# Patient Record
Sex: Female | Born: 1940 | Race: White | Hispanic: No | Marital: Married | Smoking: Former smoker
Health system: Southern US, Community
[De-identification: ages and names within clinical notes are randomized; demographics above are authoritative.]

## PROBLEM LIST (undated history)

## (undated) DIAGNOSIS — I639 Cerebral infarction, unspecified: Secondary | ICD-10-CM

## (undated) DIAGNOSIS — F32A Depression, unspecified: Secondary | ICD-10-CM

## (undated) DIAGNOSIS — K219 Gastro-esophageal reflux disease without esophagitis: Secondary | ICD-10-CM

## (undated) DIAGNOSIS — F419 Anxiety disorder, unspecified: Secondary | ICD-10-CM

## (undated) DIAGNOSIS — R569 Unspecified convulsions: Secondary | ICD-10-CM

## (undated) DIAGNOSIS — H269 Unspecified cataract: Secondary | ICD-10-CM

## (undated) DIAGNOSIS — E119 Type 2 diabetes mellitus without complications: Secondary | ICD-10-CM

## (undated) DIAGNOSIS — D75839 Thrombocytosis, unspecified: Secondary | ICD-10-CM

## (undated) DIAGNOSIS — E785 Hyperlipidemia, unspecified: Secondary | ICD-10-CM

## (undated) DIAGNOSIS — E079 Disorder of thyroid, unspecified: Secondary | ICD-10-CM

## (undated) DIAGNOSIS — C801 Malignant (primary) neoplasm, unspecified: Secondary | ICD-10-CM

## (undated) DIAGNOSIS — J449 Chronic obstructive pulmonary disease, unspecified: Secondary | ICD-10-CM

## (undated) DIAGNOSIS — I10 Essential (primary) hypertension: Secondary | ICD-10-CM

## (undated) DIAGNOSIS — E875 Hyperkalemia: Secondary | ICD-10-CM

## (undated) DIAGNOSIS — M1990 Unspecified osteoarthritis, unspecified site: Secondary | ICD-10-CM

## (undated) DIAGNOSIS — F329 Major depressive disorder, single episode, unspecified: Secondary | ICD-10-CM

## (undated) DIAGNOSIS — D473 Essential (hemorrhagic) thrombocythemia: Secondary | ICD-10-CM

## (undated) HISTORY — PX: TONSILLECTOMY: SHX5217

## (undated) HISTORY — DX: Essential (primary) hypertension: I10

## (undated) HISTORY — PX: BREAST SURGERY: SHX581

## (undated) HISTORY — DX: Hyperlipidemia, unspecified: E78.5

## (undated) HISTORY — DX: Chronic obstructive pulmonary disease, unspecified: J44.9

## (undated) HISTORY — DX: Unspecified cataract: H26.9

## (undated) HISTORY — PX: ABDOMINAL HYSTERECTOMY: SHX81

## (undated) HISTORY — DX: Anxiety disorder, unspecified: F41.9

## (undated) HISTORY — PX: OTHER SURGICAL HISTORY: SHX169

## (undated) HISTORY — PX: TONSILLECTOMY: SUR1361

## (undated) HISTORY — DX: Major depressive disorder, single episode, unspecified: F32.9

## (undated) HISTORY — DX: Disorder of thyroid, unspecified: E07.9

## (undated) HISTORY — PX: ANKLE SURGERY: SHX546

## (undated) HISTORY — PX: ROTATOR CUFF REPAIR: SHX139

## (undated) HISTORY — DX: Type 2 diabetes mellitus without complications: E11.9

## (undated) HISTORY — DX: Depression, unspecified: F32.A

---

## 2011-03-11 HISTORY — PX: COLONOSCOPY: SHX174

## 2011-03-17 DIAGNOSIS — J111 Influenza due to unidentified influenza virus with other respiratory manifestations: Secondary | ICD-10-CM | POA: Diagnosis not present

## 2011-03-17 DIAGNOSIS — J449 Chronic obstructive pulmonary disease, unspecified: Secondary | ICD-10-CM | POA: Diagnosis not present

## 2011-03-17 DIAGNOSIS — J189 Pneumonia, unspecified organism: Secondary | ICD-10-CM | POA: Diagnosis not present

## 2011-03-18 DIAGNOSIS — R0609 Other forms of dyspnea: Secondary | ICD-10-CM | POA: Diagnosis not present

## 2011-03-18 DIAGNOSIS — R0989 Other specified symptoms and signs involving the circulatory and respiratory systems: Secondary | ICD-10-CM | POA: Diagnosis not present

## 2011-03-18 DIAGNOSIS — R05 Cough: Secondary | ICD-10-CM | POA: Diagnosis not present

## 2011-03-20 DIAGNOSIS — J441 Chronic obstructive pulmonary disease with (acute) exacerbation: Secondary | ICD-10-CM | POA: Diagnosis not present

## 2011-03-20 DIAGNOSIS — I10 Essential (primary) hypertension: Secondary | ICD-10-CM | POA: Diagnosis not present

## 2011-05-26 DIAGNOSIS — E119 Type 2 diabetes mellitus without complications: Secondary | ICD-10-CM | POA: Diagnosis not present

## 2011-05-26 DIAGNOSIS — E039 Hypothyroidism, unspecified: Secondary | ICD-10-CM | POA: Diagnosis not present

## 2011-05-26 DIAGNOSIS — I10 Essential (primary) hypertension: Secondary | ICD-10-CM | POA: Diagnosis not present

## 2011-05-26 DIAGNOSIS — F411 Generalized anxiety disorder: Secondary | ICD-10-CM | POA: Diagnosis not present

## 2011-11-18 DIAGNOSIS — E039 Hypothyroidism, unspecified: Secondary | ICD-10-CM | POA: Diagnosis not present

## 2011-11-18 DIAGNOSIS — F411 Generalized anxiety disorder: Secondary | ICD-10-CM | POA: Diagnosis not present

## 2011-11-18 DIAGNOSIS — J449 Chronic obstructive pulmonary disease, unspecified: Secondary | ICD-10-CM | POA: Diagnosis not present

## 2011-11-18 DIAGNOSIS — E119 Type 2 diabetes mellitus without complications: Secondary | ICD-10-CM | POA: Diagnosis not present

## 2011-12-16 DIAGNOSIS — E039 Hypothyroidism, unspecified: Secondary | ICD-10-CM | POA: Diagnosis not present

## 2011-12-16 DIAGNOSIS — J441 Chronic obstructive pulmonary disease with (acute) exacerbation: Secondary | ICD-10-CM | POA: Diagnosis not present

## 2011-12-16 DIAGNOSIS — E119 Type 2 diabetes mellitus without complications: Secondary | ICD-10-CM | POA: Diagnosis not present

## 2011-12-30 DIAGNOSIS — J449 Chronic obstructive pulmonary disease, unspecified: Secondary | ICD-10-CM | POA: Diagnosis not present

## 2011-12-30 DIAGNOSIS — D473 Essential (hemorrhagic) thrombocythemia: Secondary | ICD-10-CM | POA: Diagnosis present

## 2011-12-30 DIAGNOSIS — R220 Localized swelling, mass and lump, head: Secondary | ICD-10-CM | POA: Diagnosis not present

## 2011-12-30 DIAGNOSIS — E039 Hypothyroidism, unspecified: Secondary | ICD-10-CM | POA: Diagnosis not present

## 2011-12-30 DIAGNOSIS — Z87891 Personal history of nicotine dependence: Secondary | ICD-10-CM | POA: Diagnosis not present

## 2011-12-30 DIAGNOSIS — R9431 Abnormal electrocardiogram [ECG] [EKG]: Secondary | ICD-10-CM | POA: Diagnosis present

## 2011-12-30 DIAGNOSIS — E119 Type 2 diabetes mellitus without complications: Secondary | ICD-10-CM | POA: Diagnosis present

## 2011-12-30 DIAGNOSIS — R0602 Shortness of breath: Secondary | ICD-10-CM | POA: Diagnosis not present

## 2011-12-30 DIAGNOSIS — D45 Polycythemia vera: Secondary | ICD-10-CM | POA: Diagnosis not present

## 2011-12-30 DIAGNOSIS — R0902 Hypoxemia: Secondary | ICD-10-CM | POA: Diagnosis not present

## 2011-12-30 DIAGNOSIS — J441 Chronic obstructive pulmonary disease with (acute) exacerbation: Secondary | ICD-10-CM | POA: Diagnosis not present

## 2011-12-30 DIAGNOSIS — I10 Essential (primary) hypertension: Secondary | ICD-10-CM | POA: Diagnosis present

## 2011-12-30 DIAGNOSIS — E860 Dehydration: Secondary | ICD-10-CM | POA: Diagnosis present

## 2011-12-30 DIAGNOSIS — E785 Hyperlipidemia, unspecified: Secondary | ICD-10-CM | POA: Diagnosis present

## 2011-12-30 DIAGNOSIS — F329 Major depressive disorder, single episode, unspecified: Secondary | ICD-10-CM | POA: Diagnosis present

## 2011-12-30 DIAGNOSIS — F3289 Other specified depressive episodes: Secondary | ICD-10-CM | POA: Diagnosis not present

## 2011-12-30 DIAGNOSIS — R221 Localized swelling, mass and lump, neck: Secondary | ICD-10-CM | POA: Diagnosis present

## 2012-01-05 DIAGNOSIS — R5381 Other malaise: Secondary | ICD-10-CM | POA: Diagnosis not present

## 2012-01-05 DIAGNOSIS — J441 Chronic obstructive pulmonary disease with (acute) exacerbation: Secondary | ICD-10-CM | POA: Diagnosis not present

## 2012-01-05 DIAGNOSIS — R5383 Other fatigue: Secondary | ICD-10-CM | POA: Diagnosis not present

## 2012-01-05 DIAGNOSIS — E119 Type 2 diabetes mellitus without complications: Secondary | ICD-10-CM | POA: Diagnosis not present

## 2012-01-05 DIAGNOSIS — Z9981 Dependence on supplemental oxygen: Secondary | ICD-10-CM | POA: Diagnosis not present

## 2012-01-05 DIAGNOSIS — I10 Essential (primary) hypertension: Secondary | ICD-10-CM | POA: Diagnosis not present

## 2012-01-05 DIAGNOSIS — F329 Major depressive disorder, single episode, unspecified: Secondary | ICD-10-CM | POA: Diagnosis not present

## 2012-01-06 DIAGNOSIS — R5383 Other fatigue: Secondary | ICD-10-CM | POA: Diagnosis not present

## 2012-01-06 DIAGNOSIS — Z9981 Dependence on supplemental oxygen: Secondary | ICD-10-CM | POA: Diagnosis not present

## 2012-01-06 DIAGNOSIS — R5381 Other malaise: Secondary | ICD-10-CM | POA: Diagnosis not present

## 2012-01-06 DIAGNOSIS — E119 Type 2 diabetes mellitus without complications: Secondary | ICD-10-CM | POA: Diagnosis not present

## 2012-01-06 DIAGNOSIS — F329 Major depressive disorder, single episode, unspecified: Secondary | ICD-10-CM | POA: Diagnosis not present

## 2012-01-06 DIAGNOSIS — I10 Essential (primary) hypertension: Secondary | ICD-10-CM | POA: Diagnosis not present

## 2012-01-06 DIAGNOSIS — J441 Chronic obstructive pulmonary disease with (acute) exacerbation: Secondary | ICD-10-CM | POA: Diagnosis not present

## 2012-01-08 DIAGNOSIS — J441 Chronic obstructive pulmonary disease with (acute) exacerbation: Secondary | ICD-10-CM | POA: Diagnosis not present

## 2012-01-08 DIAGNOSIS — R5381 Other malaise: Secondary | ICD-10-CM | POA: Diagnosis not present

## 2012-01-08 DIAGNOSIS — E119 Type 2 diabetes mellitus without complications: Secondary | ICD-10-CM | POA: Diagnosis not present

## 2012-01-08 DIAGNOSIS — Z9981 Dependence on supplemental oxygen: Secondary | ICD-10-CM | POA: Diagnosis not present

## 2012-01-08 DIAGNOSIS — F329 Major depressive disorder, single episode, unspecified: Secondary | ICD-10-CM | POA: Diagnosis not present

## 2012-01-08 DIAGNOSIS — I10 Essential (primary) hypertension: Secondary | ICD-10-CM | POA: Diagnosis not present

## 2012-01-12 DIAGNOSIS — Z9981 Dependence on supplemental oxygen: Secondary | ICD-10-CM | POA: Diagnosis not present

## 2012-01-12 DIAGNOSIS — E119 Type 2 diabetes mellitus without complications: Secondary | ICD-10-CM | POA: Diagnosis not present

## 2012-01-12 DIAGNOSIS — F329 Major depressive disorder, single episode, unspecified: Secondary | ICD-10-CM | POA: Diagnosis not present

## 2012-01-12 DIAGNOSIS — J441 Chronic obstructive pulmonary disease with (acute) exacerbation: Secondary | ICD-10-CM | POA: Diagnosis not present

## 2012-01-12 DIAGNOSIS — I10 Essential (primary) hypertension: Secondary | ICD-10-CM | POA: Diagnosis not present

## 2012-01-12 DIAGNOSIS — R5381 Other malaise: Secondary | ICD-10-CM | POA: Diagnosis not present

## 2012-01-13 DIAGNOSIS — I10 Essential (primary) hypertension: Secondary | ICD-10-CM | POA: Diagnosis not present

## 2012-01-13 DIAGNOSIS — M4180 Other forms of scoliosis, site unspecified: Secondary | ICD-10-CM | POA: Diagnosis not present

## 2012-01-13 DIAGNOSIS — J449 Chronic obstructive pulmonary disease, unspecified: Secondary | ICD-10-CM | POA: Diagnosis not present

## 2012-01-14 DIAGNOSIS — F329 Major depressive disorder, single episode, unspecified: Secondary | ICD-10-CM | POA: Diagnosis not present

## 2012-01-14 DIAGNOSIS — I10 Essential (primary) hypertension: Secondary | ICD-10-CM | POA: Diagnosis not present

## 2012-01-14 DIAGNOSIS — J441 Chronic obstructive pulmonary disease with (acute) exacerbation: Secondary | ICD-10-CM | POA: Diagnosis not present

## 2012-01-14 DIAGNOSIS — Z9981 Dependence on supplemental oxygen: Secondary | ICD-10-CM | POA: Diagnosis not present

## 2012-01-14 DIAGNOSIS — R5383 Other fatigue: Secondary | ICD-10-CM | POA: Diagnosis not present

## 2012-01-14 DIAGNOSIS — R5381 Other malaise: Secondary | ICD-10-CM | POA: Diagnosis not present

## 2012-01-14 DIAGNOSIS — E119 Type 2 diabetes mellitus without complications: Secondary | ICD-10-CM | POA: Diagnosis not present

## 2012-01-20 DIAGNOSIS — E119 Type 2 diabetes mellitus without complications: Secondary | ICD-10-CM | POA: Diagnosis not present

## 2012-01-20 DIAGNOSIS — J441 Chronic obstructive pulmonary disease with (acute) exacerbation: Secondary | ICD-10-CM | POA: Diagnosis not present

## 2012-01-20 DIAGNOSIS — Z9981 Dependence on supplemental oxygen: Secondary | ICD-10-CM | POA: Diagnosis not present

## 2012-01-20 DIAGNOSIS — R5383 Other fatigue: Secondary | ICD-10-CM | POA: Diagnosis not present

## 2012-01-20 DIAGNOSIS — R5381 Other malaise: Secondary | ICD-10-CM | POA: Diagnosis not present

## 2012-01-20 DIAGNOSIS — I10 Essential (primary) hypertension: Secondary | ICD-10-CM | POA: Diagnosis not present

## 2012-01-20 DIAGNOSIS — F329 Major depressive disorder, single episode, unspecified: Secondary | ICD-10-CM | POA: Diagnosis not present

## 2012-01-22 DIAGNOSIS — E119 Type 2 diabetes mellitus without complications: Secondary | ICD-10-CM | POA: Diagnosis not present

## 2012-01-22 DIAGNOSIS — J441 Chronic obstructive pulmonary disease with (acute) exacerbation: Secondary | ICD-10-CM | POA: Diagnosis not present

## 2012-01-22 DIAGNOSIS — F329 Major depressive disorder, single episode, unspecified: Secondary | ICD-10-CM | POA: Diagnosis not present

## 2012-01-22 DIAGNOSIS — Z9981 Dependence on supplemental oxygen: Secondary | ICD-10-CM | POA: Diagnosis not present

## 2012-01-22 DIAGNOSIS — I10 Essential (primary) hypertension: Secondary | ICD-10-CM | POA: Diagnosis not present

## 2012-01-22 DIAGNOSIS — R5381 Other malaise: Secondary | ICD-10-CM | POA: Diagnosis not present

## 2012-01-23 DIAGNOSIS — E119 Type 2 diabetes mellitus without complications: Secondary | ICD-10-CM | POA: Diagnosis not present

## 2012-01-23 DIAGNOSIS — J441 Chronic obstructive pulmonary disease with (acute) exacerbation: Secondary | ICD-10-CM | POA: Diagnosis not present

## 2012-01-23 DIAGNOSIS — F329 Major depressive disorder, single episode, unspecified: Secondary | ICD-10-CM | POA: Diagnosis not present

## 2012-01-23 DIAGNOSIS — Z9981 Dependence on supplemental oxygen: Secondary | ICD-10-CM | POA: Diagnosis not present

## 2012-01-23 DIAGNOSIS — R5381 Other malaise: Secondary | ICD-10-CM | POA: Diagnosis not present

## 2012-01-23 DIAGNOSIS — R5383 Other fatigue: Secondary | ICD-10-CM | POA: Diagnosis not present

## 2012-01-23 DIAGNOSIS — I10 Essential (primary) hypertension: Secondary | ICD-10-CM | POA: Diagnosis not present

## 2012-01-26 DIAGNOSIS — J441 Chronic obstructive pulmonary disease with (acute) exacerbation: Secondary | ICD-10-CM | POA: Diagnosis not present

## 2012-01-26 DIAGNOSIS — R5381 Other malaise: Secondary | ICD-10-CM | POA: Diagnosis not present

## 2012-01-26 DIAGNOSIS — R5383 Other fatigue: Secondary | ICD-10-CM | POA: Diagnosis not present

## 2012-01-26 DIAGNOSIS — Z9981 Dependence on supplemental oxygen: Secondary | ICD-10-CM | POA: Diagnosis not present

## 2012-01-26 DIAGNOSIS — I10 Essential (primary) hypertension: Secondary | ICD-10-CM | POA: Diagnosis not present

## 2012-01-26 DIAGNOSIS — F329 Major depressive disorder, single episode, unspecified: Secondary | ICD-10-CM | POA: Diagnosis not present

## 2012-01-26 DIAGNOSIS — E119 Type 2 diabetes mellitus without complications: Secondary | ICD-10-CM | POA: Diagnosis not present

## 2012-01-28 DIAGNOSIS — R5383 Other fatigue: Secondary | ICD-10-CM | POA: Diagnosis not present

## 2012-01-28 DIAGNOSIS — J441 Chronic obstructive pulmonary disease with (acute) exacerbation: Secondary | ICD-10-CM | POA: Diagnosis not present

## 2012-01-28 DIAGNOSIS — I10 Essential (primary) hypertension: Secondary | ICD-10-CM | POA: Diagnosis not present

## 2012-01-28 DIAGNOSIS — Z9981 Dependence on supplemental oxygen: Secondary | ICD-10-CM | POA: Diagnosis not present

## 2012-01-28 DIAGNOSIS — R5381 Other malaise: Secondary | ICD-10-CM | POA: Diagnosis not present

## 2012-01-28 DIAGNOSIS — E119 Type 2 diabetes mellitus without complications: Secondary | ICD-10-CM | POA: Diagnosis not present

## 2012-01-28 DIAGNOSIS — F329 Major depressive disorder, single episode, unspecified: Secondary | ICD-10-CM | POA: Diagnosis not present

## 2012-02-16 DIAGNOSIS — E039 Hypothyroidism, unspecified: Secondary | ICD-10-CM | POA: Diagnosis not present

## 2012-02-16 DIAGNOSIS — J449 Chronic obstructive pulmonary disease, unspecified: Secondary | ICD-10-CM | POA: Diagnosis not present

## 2012-02-16 DIAGNOSIS — F411 Generalized anxiety disorder: Secondary | ICD-10-CM | POA: Diagnosis not present

## 2012-02-16 DIAGNOSIS — E119 Type 2 diabetes mellitus without complications: Secondary | ICD-10-CM | POA: Diagnosis not present

## 2012-02-18 DIAGNOSIS — I10 Essential (primary) hypertension: Secondary | ICD-10-CM | POA: Diagnosis not present

## 2012-02-18 DIAGNOSIS — E039 Hypothyroidism, unspecified: Secondary | ICD-10-CM | POA: Diagnosis not present

## 2012-02-18 DIAGNOSIS — J449 Chronic obstructive pulmonary disease, unspecified: Secondary | ICD-10-CM | POA: Diagnosis not present

## 2012-02-18 DIAGNOSIS — E119 Type 2 diabetes mellitus without complications: Secondary | ICD-10-CM | POA: Diagnosis not present

## 2012-05-27 DIAGNOSIS — E7800 Pure hypercholesterolemia, unspecified: Secondary | ICD-10-CM | POA: Diagnosis not present

## 2012-05-27 DIAGNOSIS — E039 Hypothyroidism, unspecified: Secondary | ICD-10-CM | POA: Diagnosis not present

## 2012-05-27 DIAGNOSIS — I10 Essential (primary) hypertension: Secondary | ICD-10-CM | POA: Diagnosis not present

## 2012-05-27 DIAGNOSIS — J449 Chronic obstructive pulmonary disease, unspecified: Secondary | ICD-10-CM | POA: Diagnosis not present

## 2012-05-27 DIAGNOSIS — E119 Type 2 diabetes mellitus without complications: Secondary | ICD-10-CM | POA: Diagnosis not present

## 2012-06-16 DIAGNOSIS — F329 Major depressive disorder, single episode, unspecified: Secondary | ICD-10-CM | POA: Diagnosis not present

## 2012-06-16 DIAGNOSIS — E039 Hypothyroidism, unspecified: Secondary | ICD-10-CM | POA: Diagnosis not present

## 2012-06-16 DIAGNOSIS — E119 Type 2 diabetes mellitus without complications: Secondary | ICD-10-CM | POA: Diagnosis not present

## 2012-06-16 DIAGNOSIS — E7800 Pure hypercholesterolemia, unspecified: Secondary | ICD-10-CM | POA: Diagnosis not present

## 2012-06-23 DIAGNOSIS — J45901 Unspecified asthma with (acute) exacerbation: Secondary | ICD-10-CM | POA: Diagnosis not present

## 2012-06-23 DIAGNOSIS — J449 Chronic obstructive pulmonary disease, unspecified: Secondary | ICD-10-CM | POA: Diagnosis not present

## 2012-06-23 DIAGNOSIS — R0602 Shortness of breath: Secondary | ICD-10-CM | POA: Diagnosis not present

## 2012-06-23 DIAGNOSIS — R0902 Hypoxemia: Secondary | ICD-10-CM | POA: Diagnosis not present

## 2012-07-02 DIAGNOSIS — R0902 Hypoxemia: Secondary | ICD-10-CM | POA: Diagnosis not present

## 2012-10-01 DIAGNOSIS — H2510 Age-related nuclear cataract, unspecified eye: Secondary | ICD-10-CM | POA: Diagnosis not present

## 2012-10-01 DIAGNOSIS — E119 Type 2 diabetes mellitus without complications: Secondary | ICD-10-CM | POA: Diagnosis not present

## 2012-10-01 DIAGNOSIS — H5200 Hypermetropia, unspecified eye: Secondary | ICD-10-CM | POA: Diagnosis not present

## 2012-10-01 DIAGNOSIS — H353190 Nonexudative age-related macular degeneration, unspecified eye, stage unspecified: Secondary | ICD-10-CM | POA: Diagnosis not present

## 2012-10-04 ENCOUNTER — Encounter: Payer: Self-pay | Admitting: General Practice

## 2012-10-04 ENCOUNTER — Ambulatory Visit (INDEPENDENT_AMBULATORY_CARE_PROVIDER_SITE_OTHER): Payer: Medicare Other | Admitting: General Practice

## 2012-10-04 VITALS — BP 119/60 | HR 67 | Temp 97.3°F | Ht 65.0 in | Wt 197.0 lb

## 2012-10-04 DIAGNOSIS — J449 Chronic obstructive pulmonary disease, unspecified: Secondary | ICD-10-CM | POA: Diagnosis not present

## 2012-10-04 DIAGNOSIS — E039 Hypothyroidism, unspecified: Secondary | ICD-10-CM | POA: Diagnosis not present

## 2012-10-04 DIAGNOSIS — J438 Other emphysema: Secondary | ICD-10-CM

## 2012-10-04 DIAGNOSIS — Z9981 Dependence on supplemental oxygen: Secondary | ICD-10-CM

## 2012-10-04 DIAGNOSIS — F329 Major depressive disorder, single episode, unspecified: Secondary | ICD-10-CM

## 2012-10-04 DIAGNOSIS — E119 Type 2 diabetes mellitus without complications: Secondary | ICD-10-CM

## 2012-10-04 DIAGNOSIS — F32A Depression, unspecified: Secondary | ICD-10-CM

## 2012-10-04 NOTE — Progress Notes (Signed)
  Subjective:    Patient ID: Jordan [name], female    DOB: 11-22-1940, 72 y.o.   MRN: [id number]  HPI Patient presents today to establish care. She has been here for two weeks after moving from [facility], [place]. She has a history of COPD, Emphysema, asthma, hypothyroidism, Diabetes, and Depression. She reports that medications for COPD, Emphysema, and asthma were started in October of 2013. She reports taking medications as prescribed. Reports seeing a pulmonologist once, prior to moving to [place]. Patient's daughter would like to have oxygen equipment supplied by [facility].     Review of Systems  Constitutional: Negative for fever and chills.  Respiratory: Positive for shortness of breath and wheezing. Negative for chest tightness.        Chronic shortness of breath, cough, wheezing  Cardiovascular: Negative for chest pain and palpitations.  Gastrointestinal: Negative for nausea, vomiting, abdominal pain and blood in stool.  Genitourinary: Negative for dysuria, hematuria and difficulty urinating.  Musculoskeletal: Positive for back pain.       Chronic back pain  Neurological: Negative for dizziness, weakness and headaches.       Objective:   Physical Exam  Constitutional: She is oriented to person, place, and time. She appears well-developed and well-nourished.  HENT:  Head: Normocephalic and atraumatic.  Right Ear: External ear normal.  Left Ear: External ear normal.  Mouth/Throat: Oropharynx is clear and moist.  Eyes: Conjunctivae and EOM are normal. Pupils are equal, round, and reactive to light.  Neck: Normal range of motion. Neck supple. No thyromegaly present.  Cardiovascular: Normal rate, regular rhythm and normal heart sounds.   Pulmonary/Chest: She has wheezes in the right upper field, the right middle field, the left upper field and the left lower field.  O2 via nasal cannula @ 2L  Lymphadenopathy:    She has no cervical adenopathy.  Neurological: She is  alert and oriented to person, place, and time.  Skin: Skin is warm and dry.  Psychiatric: She has a normal mood and affect.          Assessment & Plan:  1. Unspecified hypothyroidism  2. Depression  3. Diabetes  4. Chronic obstructive pulmonary disease and 5. Other emphysema - Ambulatory referral to Pulmonology  6. Oxygen dependent - DME Other see comment -Patient and daughter to make an appointment for oxygen qualifying (would like to change from [facility] to [facility]) -RTO for scheduled and follow up appointments -Patient verbalized understanding -Coralie Keens, FNP-C

## 2012-10-04 NOTE — Patient Instructions (Addendum)

## 2012-10-06 ENCOUNTER — Ambulatory Visit (INDEPENDENT_AMBULATORY_CARE_PROVIDER_SITE_OTHER)
Admission: RE | Admit: 2012-10-06 | Discharge: 2012-10-06 | Disposition: A | Discharge status: Home / Self Care | Payer: Medicare Other | Source: Ambulatory Visit | Attending: Internal Medicine | Admitting: Internal Medicine

## 2012-10-06 ENCOUNTER — Encounter: Payer: Self-pay | Admitting: Internal Medicine

## 2012-10-06 ENCOUNTER — Ambulatory Visit (INDEPENDENT_AMBULATORY_CARE_PROVIDER_SITE_OTHER): Payer: Medicare Other | Admitting: Internal Medicine

## 2012-10-06 VITALS — BP 118/70 | HR 70 | Ht 65.0 in | Wt 196.8 lb

## 2012-10-06 DIAGNOSIS — J4489 Other specified chronic obstructive pulmonary disease: Secondary | ICD-10-CM

## 2012-10-06 DIAGNOSIS — J449 Chronic obstructive pulmonary disease, unspecified: Secondary | ICD-10-CM

## 2012-10-06 DIAGNOSIS — Z23 Encounter for immunization: Secondary | ICD-10-CM | POA: Diagnosis not present

## 2012-10-06 DIAGNOSIS — J984 Other disorders of lung: Secondary | ICD-10-CM | POA: Diagnosis not present

## 2012-10-06 MED ORDER — MOMETASONE FURO-FORMOTEROL FUM 100-5 MCG/ACT IN AERO: INHALATION_SPRAY | RESPIRATORY_TRACT | Status: DC

## 2012-10-06 MED ORDER — IPRATROPIUM-ALBUTEROL 0.5-2.5 (3) MG/3ML IN SOLN: 3.0000 mL | Freq: Four times a day (QID) | RESPIRATORY_TRACT | Status: DC | PRN

## 2012-10-06 MED ORDER — LEVALBUTEROL HCL 0.63 MG/3ML IN NEBU
0.6300 mg | INHALATION_SOLUTION | Freq: Once | RESPIRATORY_TRACT | Status: AC
  Administered 2012-10-06: 0.63 mg via RESPIRATORY_TRACT

## 2012-10-06 MED ORDER — MOMETASONE FURO-FORMOTEROL FUM 100-5 MCG/ACT IN AERO: 2.0000 | INHALATION_SPRAY | Freq: Two times a day (BID) | RESPIRATORY_TRACT | Status: DC

## 2012-10-06 MED ORDER — METHYLPREDNISOLONE ACETATE 80 MG/ML IJ SUSP
80.0000 mg | Freq: Once | INTRAMUSCULAR | Status: AC
  Administered 2012-10-06: 80 mg via INTRAMUSCULAR

## 2012-10-06 NOTE — Assessment & Plan Note (Signed)
[facility] Oct, 2013 for exacerbation. She says PFT there gave score (FEV1?) 43%.  She says today is average day. We need baseline data. Plan- neb xop, depomedrol, add stabilizing- Dulera w/ instruction. Pneumovax. CXR. Longer term will look at rehab.

## 2012-10-06 NOTE — Progress Notes (Signed)
10/06/12- 9 yoF former smoler referred courtesy of Dr Christell Constant at [facility] for COPD.  Dr Vernon Prey; COPD. [place] Friend here Childhood asthma and allergy managed at [facility] until resolved around age 72. Subsequently 3ppd smoker until quit in 2000. Treated episodically until hospitalized with acute COPD exacerbation Oct, 2013 and sent home with continuous O2 2L/ [facility]. Depends on nebulizer 4-5x/day. Moved here 3 weeks ago, into an older house, central air, no mold. Feeling more short of breath with ADLs and noting daily cough thick white phlegm. Feet swell at times. Gets sustained chest tight, and may note tachycardia after neb.No blood, fever, purulent or exertional chest pain.  Diabetic, hypertensive, but denies heart disease.  May have had pneumonia, never pneumovax.  Prior to Admission medications   Medication Sig Start Date End Date Taking? Authorizing Provider  aspirin 81 MG tablet Take 81 mg by mouth daily.   Yes Historical Provider, MD  cetirizine (ZYRTEC) 10 MG tablet Take 10 mg by mouth daily.   Yes Historical Provider, MD  HYDROcodone-homatropine (HYCODAN) 5-1.5 MG/5ML syrup Take by mouth every 6 (six) hours as needed for cough.   Yes Historical Provider, MD  ipratropium-albuterol (DUONEB) 0.5-2.5 (3) MG/3ML SOLN Take 3 mLs by nebulization every 6 (six) hours as needed. 10/06/12  Yes Waymon Budge, MD  levothyroxine (SYNTHROID, LEVOTHROID) 88 MCG tablet Take 88 mcg by mouth daily before breakfast.   Yes Historical Provider, MD  metFORMIN (GLUMETZA) 500 MG (MOD) 24 hr tablet Take 500 mg by mouth daily with breakfast.   Yes Historical Provider, MD  metoprolol succinate (TOPROL-XL) 25 MG 24 hr tablet Take 25 mg by mouth daily.   Yes Historical Provider, MD  PARoxetine (PAXIL) 20 MG tablet Take 20 mg by mouth every morning.   Yes Historical Provider, MD  pseudoephedrine-guaifenesin (MUCINEX D) 60-600 MG per tablet Take 1 tablet by mouth every 12 (twelve) hours.   Yes Historical  Provider, MD  tiotropium (SPIRIVA) 18 MCG inhalation capsule Place 18 mcg into inhaler and inhale daily.   Yes Historical Provider, MD  ALPRAZolam Prudy Feeler) 1 MG tablet Take 1 mg by mouth at bedtime as needed for sleep.    Historical Provider, MD  mometasone-formoterol (DULERA) 100-5 MCG/ACT AERO 2 puffs then rinse mouth, twice daily maintenance 10/06/12   Waymon Budge, MD  mometasone-formoterol [facility]) 100-5 MCG/ACT AERO Inhale 2 puffs into the lungs 2 (two) times daily. 10/06/12   Waymon Budge, MD   Past Medical History  Diagnosis Date  . COPD (chronic obstructive pulmonary disease)   . Thyroid disease   . Diabetes mellitus without complication   . Hypertension   . Hyperlipidemia   . Depression   . Anxiety   . Asthma   . Cataract    Past Surgical History  Procedure Laterality Date  . Abdominal hysterectomy    . Tonsillectomy    . Rotator cuff repair Right   . Ankle surgery Right   . Skin cancers    . Breast surgery      Bx, none malignant   Family History  Problem Relation Age of Onset  . Alzheimer's disease Mother   . Arthritis Father    History   Social History  . Marital Status: Married    Spouse Name: N/A    Number of Children: N/A  . Years of Education: N/A   Occupational History  . Not on file.   Social History Main Topics  . Smoking status: Former Smoker  Types: Cigarettes    Quit date: 03/10/2000  . Smokeless tobacco: Not on file  . Alcohol Use: No  . Drug Use: No  . Sexually Active: No   Other Topics Concern  . Not on file   Social History Narrative  . No narrative on file   ROS-see HPI Constitutional:   No-   weight loss, night sweats, fevers, chills, fatigue, lassitude. HEENT:   + headaches, difficulty swallowing, tooth/dental problems, sore throat,       + sneezing,+ itching, ear ache, +nasal congestion, post nasal drip,  CV:  + chest pain, orthopnea, PND, +swelling in lower extremities, anasarca, dizziness, +palpitations Resp:  +shortness of breath with exertion or at rest.              + productive cough,  + non-productive cough,  No- coughing up of blood.              No-   change in color of mucus.  + wheezing.   Skin: No-   rash or lesions. GI:  No-   heartburn, indigestion, abdominal pain, nausea, vomiting, diarrhea,                 change in bowel habits, loss of appetite GU: No-   dysuria, change in color of urine, no urgency or frequency.  No- flank pain. MS:  No-   joint pain or swelling.  No- decreased range of motion.  No- back pain. Neuro-     nothing unusual Psych:  No- change in mood or affect. +depression or anxiety.  No memory loss.  OBJ- Physical Exam BP 118/70  Pulse 70  Ht 5\' 5"  (1.651 m)  Wt 196 lb 12.8 oz (89.268 kg)  BMI 32.75 kg/m2  SpO2 97% General- Alert, Oriented, Affect-appropriate, Distress- none acute, breathless speech on O2 2L Skin- rash-none, lesions- none, excoriation- none Lymphadenopathy- none Head- atraumatic            Eyes- Gross vision intact, PERRLA, conjunctivae and secretions clear            Ears- Hearing, canals-normal            Nose- Clear, no-Septal dev, mucus, polyps, erosion, perforation             Throat- Mallampati II , mucosa clear , drainage- none, tonsils- atrophic Neck- flexible , trachea midline, no stridor , thyroid nl, carotid no bruit Chest - symmetrical excursion , unlabored           Heart/CV- RRR , no murmur , no gallop  , no rub, nl s1 s2                           - JVD- none , edema-+trace, stasis changes- none, varices- none           Lung- +I&E wheeze diffuse, cough- none , dullness-none, rub- none           Chest wall-  Abd- soft. BS present Br/ Gen/ Rectal- Not done, not indicated Extrem- cyanosis- none, clubbing, none, atrophy- none, strength- nl Neuro- grossly intact to observation

## 2012-10-06 NOTE — Patient Instructions (Addendum)
Order- CXR  Dx [facility]  [facility]. Needs to get Duoneb/ ipratropium-albuterol neb solution through [facility]   Script written  Depo 80  Neb xop 0.63  Pneumovax  Please call as needed

## 2012-10-13 ENCOUNTER — Other Ambulatory Visit: Payer: Self-pay

## 2012-10-15 NOTE — Progress Notes (Signed)
Quick Note:  LMTCB ______ 

## 2012-10-18 ENCOUNTER — Encounter: Payer: Self-pay | Admitting: General Practice

## 2012-10-18 ENCOUNTER — Ambulatory Visit (INDEPENDENT_AMBULATORY_CARE_PROVIDER_SITE_OTHER): Payer: Medicare Other | Admitting: General Practice

## 2012-10-18 VITALS — BP 117/66 | HR 62 | Temp 97.9°F | Ht 65.0 in | Wt 191.5 lb

## 2012-10-18 DIAGNOSIS — Z9981 Dependence on supplemental oxygen: Secondary | ICD-10-CM

## 2012-10-18 DIAGNOSIS — J449 Chronic obstructive pulmonary disease, unspecified: Secondary | ICD-10-CM

## 2012-10-18 NOTE — Progress Notes (Signed)
  Subjective:    Patient ID: Jordan [name], female    DOB: 1940-04-12, 72 y.o.   MRN: [id number]  HPI Patient presents today for oxygen qualification in an effort to change DME companies. [place] and is living with daughter. Other DME companies are closer. She reports being seen by a pulmonologist, medications were changed and she is feeling and breathing better. Patient is currently wearing her nasal oxygen.     Review of Systems  Constitutional: Negative for fever and chills.  Respiratory: Negative for chest tightness and shortness of breath.   Cardiovascular: Negative for chest pain and palpitations.  Neurological: Negative for dizziness, weakness and headaches.       Objective:   Physical Exam  Constitutional: She is oriented to person, place, and time. She appears well-developed and well-nourished.  Cardiovascular: Normal rate, regular rhythm and normal heart sounds.   Pulmonary/Chest: Effort normal. No respiratory distress. She exhibits no tenderness.  Slight bilateral upper low expiratory wheezing noted  Neurological: She is alert and oriented to person, place, and time.  Skin: Skin is warm and dry.  Psychiatric: She has a normal mood and affect.          Assessment & Plan:  1. History of home oxygen therapy SATURATION QUALIFICATIONS:   Patient Saturations on Room Air at Rest = 94%  Patient Saturations on Room Air while Ambulating = 90%  Patient Saturations on 2 Liters of oxygen while Ambulating = 97% -Patient informed of Oxygen saturations -Patient and daughter verbalized they would wait and change DME companies after seeing pulmonologist a few more times, because patient seems to be breathing better -RTO if symptoms develop or worsen and for scheduled follow up -Coralie Keens, FNP-C

## 2012-10-18 NOTE — Progress Notes (Signed)
Quick Note:  Pt aware of results. ______ 

## 2012-10-22 ENCOUNTER — Encounter: Payer: Self-pay | Admitting: Internal Medicine

## 2012-10-22 ENCOUNTER — Ambulatory Visit (INDEPENDENT_AMBULATORY_CARE_PROVIDER_SITE_OTHER): Payer: Medicare Other | Admitting: Internal Medicine

## 2012-10-22 VITALS — BP 123/81 | HR 68 | Ht 65.0 in | Wt 191.0 lb

## 2012-10-22 DIAGNOSIS — J449 Chronic obstructive pulmonary disease, unspecified: Secondary | ICD-10-CM

## 2012-10-22 MED ORDER — MOMETASONE FURO-FORMOTEROL FUM 100-5 MCG/ACT IN AERO: INHALATION_SPRAY | RESPIRATORY_TRACT | Status: DC

## 2012-10-22 MED ORDER — ALBUTEROL SULFATE HFA 108 (90 BASE) MCG/ACT IN AERS: 2.0000 | INHALATION_SPRAY | RESPIRATORY_TRACT | Status: DC | PRN

## 2012-10-22 MED ORDER — IPRATROPIUM-ALBUTEROL 0.5-2.5 (3) MG/3ML IN SOLN: 3.0000 mL | Freq: Four times a day (QID) | RESPIRATORY_TRACT | Status: DC | PRN

## 2012-10-22 NOTE — Progress Notes (Signed)
10/06/12- 62 yoF former smoler referred courtesy of Dr Christell Constant at [facility] for COPD.  Dr Vernon Prey; COPD[place] Friend [place] Childhood asthma and allergy managed at [facility] until resolved around age 72. Subsequently 3ppd smoker until quit in 2000. Treated episodically until hospitalized with acute COPD exacerbation Oct, 2013 and sent home with continuous O2 2L/ [facility]. Depends on nebulizer 4-5x/day. Moved here 3 weeks ago, into an older house, central air, no mold. Feeling more short of breath with ADLs and noting daily cough thick white phlegm. Feet swell at times. Gets sustained chest tight, and may note tachycardia after neb.No blood, fever, purulent or exertional chest pain.  Diabetic, hypertensive, but denies heart disease.  May have had pneumonia, never pneumovax.  10/22/12-  57 yoF former smoker referred courtesy of Dr Christell Constant at [facility] for COPD FOLLOWS ZOX:[id number] much better since last visit!!! "Feels like a new person" Feeling much better. Still some shortness of breath in the shower. Has home oxygen from the hospital, using when necessary now at 2 L/ [facility]. She did not qualify for home oxygen during exertion with exercise test by Dr. Christell Constant. CXR 10/18/12 IMPRESSION:  No acute findings.  Original Report Authenticated By: Esperanza Heir, M.D.   ROS-see HPI Constitutional:   No-   weight loss, night sweats, fevers, chills, fatigue, lassitude. HEENT:   + headaches, difficulty swallowing, tooth/dental problems, sore throat,       + sneezing,+ itching, ear ache, +nasal congestion, post nasal drip,  CV:  + chest pain, orthopnea, PND, +swelling in lower extremities, anasarca, dizziness, +palpitations Resp: +shortness of breath with exertion or at rest.              + productive cough,  + non-productive cough,  No- coughing up of blood.              No-   change in color of mucus.  + wheezing.   Skin: No-   rash or lesions. GI:  No-   heartburn, indigestion,  abdominal pain, nausea, vomiting,  GU:  MS:  No-   joint pain or swelling.  . Neuro-     nothing unusual Psych:  No- change in mood or affect. +depression or anxiety.  No memory loss.  OBJ- Physical Exam  General- Alert, Oriented, Affect+mildly anxious, Distress- none acute,  Skin- rash-none, lesions- none, excoriation- none Lymphadenopathy- none Head- atraumatic            Eyes- Gross vision intact, PERRLA, conjunctivae and secretions clear            Ears- Hearing, canals-normal            Nose- Clear, no-Septal dev, mucus, polyps, erosion, perforation             Throat- Mallampati II , mucosa clear , drainage- none, tonsils- atrophic Neck- flexible , trachea midline, no stridor , thyroid nl, carotid no bruit Chest - symmetrical excursion , unlabored           Heart/CV- RRR , no murmur , no gallop  , no rub, nl s1 s2                           - JVD- none , edema-+trace, stasis changes- none, varices- none           Lung- +I&E mild wheeze upper zones, cough- none , dullness-none, rub- none  Chest wall-  Abd- soft. BS present Br/ Gen/ Rectal- Not done, not indicated Extrem- cyanosis- none, clubbing, none, atrophy- none, strength- nl Neuro- grossly intact to observation

## 2012-10-22 NOTE — Patient Instructions (Addendum)
Order- [facility]- refer for Pulmonary Rehabilitation Glold III COPD             ONOX on room air  Order-   Neb solution Duoneb through [facility] - script printed  DxCOPD  Try without oxygen during the day- use it if you really need to  Script sent for [facility] refills  Script for DuoNeb for the DME company

## 2012-10-25 DIAGNOSIS — J449 Chronic obstructive pulmonary disease, unspecified: Secondary | ICD-10-CM | POA: Diagnosis not present

## 2012-10-26 ENCOUNTER — Telehealth: Payer: Self-pay | Admitting: Internal Medicine

## 2012-10-26 DIAGNOSIS — J449 Chronic obstructive pulmonary disease, unspecified: Secondary | ICD-10-CM

## 2012-10-26 DIAGNOSIS — J4489 Other specified chronic obstructive pulmonary disease: Secondary | ICD-10-CM

## 2012-10-26 NOTE — Telephone Encounter (Signed)
Order has been sent and staff message sent to melissa. 

## 2012-11-03 ENCOUNTER — Telehealth: Payer: Self-pay | Admitting: Internal Medicine

## 2012-11-03 DIAGNOSIS — J449 Chronic obstructive pulmonary disease, unspecified: Secondary | ICD-10-CM

## 2012-11-03 NOTE — Telephone Encounter (Signed)
Per CY -patient's ONO shows she qualifies for O2 at 2L/M QHS (her O2 level was low for 16 minutes); will need to make patient aware and order O2 through [facility].   LMTCB-ask for Katie ONLY!

## 2012-11-03 NOTE — Telephone Encounter (Signed)
Pt calling for ONO results.  Will forward to Katie to address

## 2012-11-04 ENCOUNTER — Ambulatory Visit: Payer: Medicare Other | Admitting: Internal Medicine

## 2012-11-05 NOTE — Telephone Encounter (Signed)
lmomtcb x1 

## 2012-11-09 NOTE — Telephone Encounter (Signed)
LMTCB

## 2012-11-10 NOTE — Telephone Encounter (Signed)
Called pt to inform of ONO results.  Pt reports that she already has oxygen in the home from [facility] in [place] but would like to switch to [facility].  Also pt states that she wears oxygen during the day also.  Please advise if ok to switch companies and also if order should be for O2 at HS only or daytime also.

## 2012-11-10 NOTE — Assessment & Plan Note (Signed)
She is doing better and may not need oxygen, but we will check overnight oximetry. Plan-overnight oximetry, arranged to get her nebulizer medication through Medicare part B./DME company, refer for pulmonary rehabilitation

## 2012-11-16 DIAGNOSIS — L851 Acquired keratosis [keratoderma] palmaris et plantaris: Secondary | ICD-10-CM | POA: Diagnosis not present

## 2012-11-16 DIAGNOSIS — B351 Tinea unguium: Secondary | ICD-10-CM | POA: Diagnosis not present

## 2012-11-16 DIAGNOSIS — E1149 Type 2 diabetes mellitus with other diabetic neurological complication: Secondary | ICD-10-CM | POA: Diagnosis not present

## 2012-11-16 NOTE — Telephone Encounter (Signed)
lmomtcb x1 for pt--order sent to [facility]

## 2012-11-16 NOTE — Telephone Encounter (Signed)
Her ONOX from 10/25/12 qualified her for sleep O2, but we had understood that she did not qualify for daytime O2 based on exercise oximetry done elsewhere and resting oximetry was good here at last OV on room air. Ok to order [facility] DME change to local O2 supplier for sleep O2 2L/ min for dx COPD

## 2012-11-18 NOTE — Telephone Encounter (Signed)
Spoke with patient, Patient states o2 has already been delivered via [facility] Verified appt in AM with CY Nothing further needed at this time

## 2012-11-19 ENCOUNTER — Encounter: Payer: Self-pay | Admitting: Internal Medicine

## 2012-11-19 ENCOUNTER — Ambulatory Visit (INDEPENDENT_AMBULATORY_CARE_PROVIDER_SITE_OTHER): Payer: Medicare Other | Admitting: Internal Medicine

## 2012-11-19 VITALS — BP 122/78 | HR 65 | Ht 65.0 in | Wt 192.2 lb

## 2012-11-19 DIAGNOSIS — Z23 Encounter for immunization: Secondary | ICD-10-CM | POA: Diagnosis not present

## 2012-11-19 DIAGNOSIS — J449 Chronic obstructive pulmonary disease, unspecified: Secondary | ICD-10-CM | POA: Diagnosis not present

## 2012-11-19 NOTE — Patient Instructions (Addendum)
Depo 80  Flu vax  Try leaving off the nebulizer treatments unless really needed. See if the tremor improves.  Consider a HEPA room air cleaner if construction dust remains a big problem.

## 2012-11-19 NOTE — Progress Notes (Signed)
10/06/12- 72 yoF former smoler referred courtesy of Dr Christell Constant at [facility] for COPD.  Dr Vernon Prey; COPD. [place] Friend here Childhood asthma and allergy managed at [facility] until resolved around age 72. Subsequently 3ppd smoker until quit in 2000. Treated episodically until hospitalized with acute COPD exacerbation Oct, 2013 and sent home with continuous O2 2L/ [facility]. Depends on nebulizer 4-5x/day. Moved here 3 weeks ago, into an older house, central air, no mold. Feeling more short of breath with ADLs and noting daily cough thick white phlegm. Feet swell at times. Gets sustained chest tight, and may note tachycardia after neb.No blood, fever, purulent or exertional chest pain.  Diabetic, hypertensive, but denies heart disease.  May have had pneumonia, never pneumovax.  10/22/12-  72 yoF former smoker referred courtesy of Dr Christell Constant at [facility] for COPD FOLLOWS ZOX:[id number] much better since last visit!!! "Feels like a new person" Feeling much better. Still some shortness of breath in the shower. Has home oxygen from the hospital, using when necessary now at 2 L/ [facility]. She did not qualify for home oxygen during exertion with exercise test by Dr. Christell Constant. CXR 10/18/12 IMPRESSION:  No acute findings.  Original Report Authenticated By: Esperanza Heir, M.D.  11/19/12- 72 yoF former smoker referred courtesy of Dr Christell Constant at [name] for COPD FOLLOWS FOR:  Breathing unchanged since last OV.  Having construction in home, so as of yesterday chest feeling heavy due the dust  Continues oxygen 2 L for sleep/ [facility].  ROS-see HPI Constitutional:   No-   weight loss, night sweats, fevers, chills, fatigue, lassitude. HEENT:   No- headaches, difficulty swallowing, tooth/dental problems, sore throat,       No- sneezing,no- itching, ear ache, +nasal congestion, post nasal drip,  CV:  No-chest pain, orthopnea, PND, no-swelling in lower extremities, anasarca, dizziness,  +palpitations Resp: +shortness of breath with exertion or at rest.              No- productive cough,  + non-productive cough,  No- coughing up of blood.              No-   change in color of mucus.  + wheezing.   Skin: No-   rash or lesions. GI:  No-   heartburn, indigestion, abdominal pain, nausea, vomiting,  GU:  MS:  No-   joint pain or swelling.  . Neuro-     nothing unusual Psych:  No- change in mood or affect. +depression or anxiety.  No memory loss.  OBJ- Physical Exam  General- Alert, Oriented, Affect+mildly anxious, Distress- none acute,  Skin- rash-none, lesions- none, excoriation- none Lymphadenopathy- none Head- atraumatic            Eyes- Gross vision intact, PERRLA, conjunctivae and secretions clear            Ears- Hearing, canals-normal            Nose- Clear, no-Septal dev, mucus, polyps, erosion, perforation             Throat- Mallampati II , mucosa clear , drainage- none, tonsils- atrophic Neck- flexible , trachea midline, no stridor , thyroid nl, carotid no bruit Chest - symmetrical excursion , unlabored           Heart/CV- RRR , no murmur , no gallop  , no rub, nl s1 s2                           -  JVD- none , edema-+trace, stasis changes- none, varices- none           Lung- +diminished but clear, cough- none , dullness-none, rub- none           Chest wall-  Abd-  Br/ Gen/ Rectal- Not done, not indicated Extrem- cyanosis- none, clubbing, none, atrophy- none, strength- nl Neuro- grossly intact to observation

## 2012-11-28 NOTE — Assessment & Plan Note (Signed)
Acute exacerbation of COPD Plan-Depo-Medrol, air cleaner for home, flu vaccine. Can skip nebulizer treatments when not needed

## 2012-12-06 ENCOUNTER — Telehealth: Payer: Self-pay | Admitting: Internal Medicine

## 2012-12-06 NOTE — Telephone Encounter (Signed)
Spoke with patient-- patient states she is returning a call fro our office I advised patient that I did not see anywhere in chart where patient was called Patient states this may be an old call that she did not clear from her phone Patient advsied to call office if anything further was needed, nothing else needed at this time

## 2013-01-25 DIAGNOSIS — E1149 Type 2 diabetes mellitus with other diabetic neurological complication: Secondary | ICD-10-CM | POA: Diagnosis not present

## 2013-01-25 DIAGNOSIS — L851 Acquired keratosis [keratoderma] palmaris et plantaris: Secondary | ICD-10-CM | POA: Diagnosis not present

## 2013-01-25 DIAGNOSIS — B351 Tinea unguium: Secondary | ICD-10-CM | POA: Diagnosis not present

## 2013-02-18 ENCOUNTER — Encounter: Payer: Self-pay | Admitting: Internal Medicine

## 2013-02-18 ENCOUNTER — Ambulatory Visit (INDEPENDENT_AMBULATORY_CARE_PROVIDER_SITE_OTHER): Payer: Medicare Other | Admitting: Internal Medicine

## 2013-02-18 VITALS — BP 120/76 | HR 60 | Ht 65.0 in | Wt 190.4 lb

## 2013-02-18 DIAGNOSIS — J449 Chronic obstructive pulmonary disease, unspecified: Secondary | ICD-10-CM | POA: Diagnosis not present

## 2013-02-18 MED ORDER — ALPRAZOLAM 0.5 MG PO TABS: ORAL_TABLET | ORAL | Status: DC

## 2013-02-18 NOTE — Progress Notes (Signed)
10/06/12- 28 yoF former smoler referred courtesy of Dr Christell Constant at [facility] for COPD.  Dr Vernon Prey; COPD. [place] Friend here Childhood asthma and allergy managed at [facility] until resolved around age 72. Subsequently 3ppd smoker until quit in 2000. Treated episodically until hospitalized with acute COPD exacerbation Oct, 2013 and sent home with continuous O2 2L/ [facility]. Depends on nebulizer 4-5x/day. Moved here 3 weeks ago, into an older house, central air, no mold. Feeling more short of breath with ADLs and noting daily cough thick white phlegm. Feet swell at times. Gets sustained chest tight, and may note tachycardia after neb.No blood, fever, purulent or exertional chest pain.  Diabetic, hypertensive, but denies heart disease.  May have had pneumonia, never pneumovax.  10/22/12-  21 yoF former smoker referred courtesy of Dr Christell Constant at [facility] for COPD FOLLOWS FAO:[id number] much better since last visit!!! "Feels like a new person" Feeling much better. Still some shortness of breath in the shower. Has home oxygen from the hospital, using when necessary now at 2 L/ [facility]. She did not qualify for home oxygen during exertion with exercise test by Dr. Christell Constant. CXR 10/18/12 IMPRESSION:  No acute findings.  Original Report Authenticated By: Esperanza Heir, M.D.  11/19/12- 60 yoF former smoker referred courtesy of Dr Christell Constant at [facility] for COPD FOLLOWS FOR:  Breathing unchanged since last OV.  Having construction in home, so as of yesterday chest feeling heavy due the dust  Continues oxygen 2 L for sleep/ [facility].  02/18/13- 22 yoF former smoker referred courtesy of Dr Christell Constant at [facility] for COPD FOLLOWS FOR: drainage at night causing a cough-feels like she is taking a cold Continues oxygen 2 L/[facility]. Breathing has been well-controlled with occasional mild wheeze. She only uses Dulera once or twice daily, keeping dose down to avoid shaking. Nebulizer twice  daily.  ROS-see HPI Constitutional:   No-   weight loss, night sweats, fevers, chills, fatigue, lassitude. HEENT:   No- headaches, difficulty swallowing, tooth/dental problems, sore throat,       No- sneezing,no- itching, ear ache, +nasal congestion, post nasal drip,  CV:  No-chest pain, orthopnea, PND, no-swelling in lower extremities, anasarca, dizziness, +palpitations Resp: +shortness of breath with exertion or at rest.              No- productive cough,  + non-productive cough,  No- coughing up of blood.              No-   change in color of mucus.  + wheezing.   Skin: No-   rash or lesions. GI:  No-   heartburn, indigestion, abdominal pain, nausea, vomiting,  GU:  MS:  No-   joint pain or swelling.  . Neuro-  +tremor with sympathetic meds Psych:  No- change in mood or affect. +depression or anxiety.  No memory loss.  OBJ- Physical Exam  General- Alert, Oriented, Affect+mildly anxious, Distress- none acute,  Skin- rash-none, lesions- none, excoriation- none Lymphadenopathy- none Head- atraumatic            Eyes- Gross vision intact, PERRLA, conjunctivae and secretions clear            Ears- Hearing, canals-normal            Nose- Clear, no-Septal dev, mucus, polyps, erosion, perforation             Throat- Mallampati II , mucosa clear , drainage- none, tonsils- atrophic Neck- flexible , trachea midline,  no stridor , thyroid nl, carotid no bruit Chest - symmetrical excursion , unlabored           Heart/CV- RRR , no murmur , no gallop  , no rub, nl s1 s2                           - JVD- none , edema-+trace, stasis changes- none, varices- none           Lung- +diminished but clear, cough- none , dullness-none, rub- none           Chest wall-  Abd-  Br/ Gen/ Rectal- Not done, not indicated Extrem- cyanosis- none, clubbing, none, atrophy- none, strength- nl Neuro- grossly intact to observation

## 2013-02-18 NOTE — Patient Instructions (Signed)
Script refill for alprazolam. Be careful to avoid over-sedation. Use the least that you need.  We can continue your regular meds,  Please call as needed

## 2013-02-24 ENCOUNTER — Telehealth: Payer: Self-pay | Admitting: General Practice

## 2013-02-25 MED ORDER — METFORMIN HCL ER (MOD) 500 MG PO TB24: 500.0000 mg | ORAL_TABLET | Freq: Every day | ORAL | Status: DC

## 2013-02-25 NOTE — Telephone Encounter (Signed)
Pt has appt 03/01/13

## 2013-03-01 ENCOUNTER — Ambulatory Visit (INDEPENDENT_AMBULATORY_CARE_PROVIDER_SITE_OTHER): Payer: Medicare Other | Admitting: General Practice

## 2013-03-01 ENCOUNTER — Encounter: Payer: Self-pay | Admitting: General Practice

## 2013-03-01 VITALS — BP 126/68 | HR 64 | Temp 97.5°F | Wt 190.0 lb

## 2013-03-01 DIAGNOSIS — E039 Hypothyroidism, unspecified: Secondary | ICD-10-CM

## 2013-03-01 DIAGNOSIS — E785 Hyperlipidemia, unspecified: Secondary | ICD-10-CM

## 2013-03-01 DIAGNOSIS — Z09 Encounter for follow-up examination after completed treatment for conditions other than malignant neoplasm: Secondary | ICD-10-CM

## 2013-03-01 DIAGNOSIS — F329 Major depressive disorder, single episode, unspecified: Secondary | ICD-10-CM

## 2013-03-01 DIAGNOSIS — E119 Type 2 diabetes mellitus without complications: Secondary | ICD-10-CM

## 2013-03-01 DIAGNOSIS — I10 Essential (primary) hypertension: Secondary | ICD-10-CM

## 2013-03-01 LAB — POCT CBC
Granulocyte percent: 78.2 %G (ref 37–80)
HCT, POC: 44.5 % (ref 37.7–47.9)
MCH, POC: 28.9 pg (ref 27–31.2)
MCV: 93.7 fL (ref 80–97)
RBC: 4.8 M/uL (ref 4.04–5.48)
RDW, POC: 16 %
WBC: 8.3 10*3/uL (ref 4.6–10.2)

## 2013-03-01 MED ORDER — METOPROLOL SUCCINATE ER 25 MG PO TB24: 25.0000 mg | ORAL_TABLET | Freq: Every day | ORAL | Status: DC

## 2013-03-01 MED ORDER — METFORMIN HCL ER (MOD) 500 MG PO TB24: 500.0000 mg | ORAL_TABLET | Freq: Every day | ORAL | Status: DC

## 2013-03-01 MED ORDER — PAROXETINE HCL 20 MG PO TABS: 20.0000 mg | ORAL_TABLET | ORAL | Status: DC

## 2013-03-01 MED ORDER — ATORVASTATIN CALCIUM 40 MG PO TABS: 40.0000 mg | ORAL_TABLET | Freq: Every day | ORAL | Status: DC

## 2013-03-01 MED ORDER — LEVOTHYROXINE SODIUM 88 MCG PO TABS: 88.0000 ug | ORAL_TABLET | Freq: Every day | ORAL | Status: DC

## 2013-03-01 NOTE — Progress Notes (Signed)
   Subjective:    Patient ID: Jordan [name], female    DOB: 08/15/1940, 72 y.o.   MRN: [id number]  HPI Patient presents today for chronic health follow up. She has a history of DM type 2, HTN, anxiety, depression, and copd. Reports eating a healthy diet, but denies regular exercise. Reports Copd is well managed since seeing pulmonologist.     Review of Systems  Constitutional: Negative for fever and chills.  Respiratory: Positive for shortness of breath. Negative for cough, chest tightness and wheezing.        Shortness of breath upon exertion at times  Cardiovascular: Negative for chest pain and palpitations.  All other systems reviewed and are negative.       Objective:   Physical Exam  Constitutional: She is oriented to person, place, and time. She appears well-developed and well-nourished.  HENT:  Head: Normocephalic and atraumatic.  Right Ear: External ear normal.  Left Ear: External ear normal.  Mouth/Throat: Oropharynx is clear and moist.  Eyes: EOM are normal. Pupils are equal, round, and reactive to light.  Neck: Normal range of motion. Neck supple. No thyromegaly present.  Cardiovascular: Normal rate and normal heart sounds.   Pulmonary/Chest: Effort normal. No respiratory distress. She exhibits no tenderness.  Slightly diiminished breath sounds throughout  Lymphadenopathy:    She has no cervical adenopathy.  Neurological: She is alert and oriented to person, place, and time.  Skin: Skin is warm and dry.  Psychiatric: She has a normal mood and affect.          Assessment & Plan:  1. HTN (hypertension)  - metoprolol succinate (TOPROL-XL) 25 MG 24 hr tablet; Take 1 tablet (25 mg total) by mouth daily.  Dispense: 90 tablet; Refill: 1 - CMP14+EGFR  2. Depression  - PARoxetine (PAXIL) 20 MG tablet; Take 1 tablet (20 mg total) by mouth every morning.  Dispense: 90 tablet; Refill: 1  3. HLD (hyperlipidemia)  - atorvastatin (LIPITOR) 40 MG tablet; Take 1 tablet (40  mg total) by mouth daily.  Dispense: 90 tablet; Refill: 1 - NMR, lipoprofile  4. Hypothyroidism  - levothyroxine (SYNTHROID, LEVOTHROID) 88 MCG tablet; Take 1 tablet (88 mcg total) by mouth daily before breakfast.  Dispense: 90 tablet; Refill: 1 - Thyroid Panel With TSH  5. DM type 2 (diabetes mellitus, type 2)  - metFORMIN (GLUMETZA) 500 MG (MOD) 24 hr tablet; Take 1 tablet (500 mg total) by mouth daily with breakfast.  Dispense: 90 tablet; Refill: 1 - POCT glycosylated hemoglobin (Hb A1C)  6. Follow-up exam, 3-6 months since previous exam  - POCT CBC -Continue all current medications Labs pending F/u in 3 months Discussed benefits of healthy eating Patient verbalized understanding Coralie Keens, FNP-C

## 2013-03-02 ENCOUNTER — Telehealth: Payer: Self-pay | Admitting: *Deleted

## 2013-03-02 ENCOUNTER — Other Ambulatory Visit: Payer: Self-pay | Admitting: General Practice

## 2013-03-02 ENCOUNTER — Other Ambulatory Visit (INDEPENDENT_AMBULATORY_CARE_PROVIDER_SITE_OTHER): Payer: Medicare Other

## 2013-03-02 DIAGNOSIS — E875 Hyperkalemia: Secondary | ICD-10-CM

## 2013-03-02 LAB — POTASSIUM: Potassium: 6.3 mmol/L (ref 3.5–5.2)

## 2013-03-02 LAB — CMP14+EGFR
Albumin: 4.6 g/dL (ref 3.5–4.8)
Alkaline Phosphatase: 78 IU/L (ref 39–117)
BUN/Creatinine Ratio: 13 (ref 11–26)
BUN: 12 mg/dL (ref 8–27)
Creatinine, Ser: 0.94 mg/dL (ref 0.57–1.00)
GFR calc Af Amer: 70 mL/min/{1.73_m2} (ref >59)
Globulin, Total: 2.2 g/dL (ref 1.5–4.5)
Sodium: 144 mmol/L (ref 134–144)
Total Bilirubin: 0.5 mg/dL (ref 0.0–1.2)

## 2013-03-02 LAB — NMR, LIPOPROFILE
HDL Particle Number: 27.4 umol/L — ABNORMAL LOW (ref >=30.5)
LDL Particle Number: 2556 nmol/L — ABNORMAL HIGH (ref <1000)
LDL Size: 20.5 nm — ABNORMAL LOW (ref >20.5)
LDLC SERPL CALC-MCNC: 184 mg/dL — ABNORMAL HIGH (ref <100)
LP-IR Score: 64 — ABNORMAL HIGH (ref <=45)
Small LDL Particle Number: 1444 nmol/L — ABNORMAL HIGH (ref <=527)

## 2013-03-02 LAB — THYROID PANEL WITH TSH
T3 Uptake Ratio: 26 % (ref 24–39)
TSH: 12.89 u[IU]/mL — ABNORMAL HIGH (ref 0.450–4.500)

## 2013-03-02 NOTE — Telephone Encounter (Signed)
Pt notified she will need to have K+ repeated due to hyperkalemia Verbalizes understanding

## 2013-03-11 ENCOUNTER — Telehealth: Payer: Self-pay | Admitting: General Practice

## 2013-03-11 ENCOUNTER — Other Ambulatory Visit: Payer: Self-pay | Admitting: General Practice

## 2013-03-11 DIAGNOSIS — Z85828 Personal history of other malignant neoplasm of skin: Secondary | ICD-10-CM | POA: Diagnosis not present

## 2013-03-11 DIAGNOSIS — J069 Acute upper respiratory infection, unspecified: Secondary | ICD-10-CM | POA: Diagnosis not present

## 2013-03-11 DIAGNOSIS — E039 Hypothyroidism, unspecified: Secondary | ICD-10-CM

## 2013-03-11 DIAGNOSIS — Z87891 Personal history of nicotine dependence: Secondary | ICD-10-CM | POA: Diagnosis not present

## 2013-03-11 DIAGNOSIS — J449 Chronic obstructive pulmonary disease, unspecified: Secondary | ICD-10-CM | POA: Diagnosis not present

## 2013-03-11 DIAGNOSIS — I498 Other specified cardiac arrhythmias: Secondary | ICD-10-CM | POA: Diagnosis not present

## 2013-03-11 DIAGNOSIS — D691 Qualitative platelet defects: Secondary | ICD-10-CM

## 2013-03-11 DIAGNOSIS — R7989 Other specified abnormal findings of blood chemistry: Secondary | ICD-10-CM

## 2013-03-11 DIAGNOSIS — R51 Headache: Secondary | ICD-10-CM | POA: Diagnosis not present

## 2013-03-11 DIAGNOSIS — E119 Type 2 diabetes mellitus without complications: Secondary | ICD-10-CM | POA: Diagnosis not present

## 2013-03-11 DIAGNOSIS — Z79899 Other long term (current) drug therapy: Secondary | ICD-10-CM | POA: Diagnosis not present

## 2013-03-11 MED ORDER — LEVOTHYROXINE SODIUM 100 MCG PO TABS: 100.0000 ug | ORAL_TABLET | Freq: Every day | ORAL | Status: DC

## 2013-03-11 NOTE — Telephone Encounter (Signed)
Spoke with patient and informed she should go to emergency room to have potassium level drawn, due to elevated level. Patient verbalized understanding and in agreement.

## 2013-03-11 NOTE — Telephone Encounter (Signed)
Spoke with patient and informed she should go to emergency room for further evaluation due to potassium being elevated. Patient verbalized understanding and in agreement.

## 2013-03-13 NOTE — Assessment & Plan Note (Signed)
Acute exacerbation of COPD with asthma. Routine refills but she has appropriate meds for now

## 2013-03-24 NOTE — Progress Notes (Signed)
Patient came in for labs only.

## 2013-04-06 ENCOUNTER — Other Ambulatory Visit: Payer: Self-pay | Admitting: General Practice

## 2013-04-06 DIAGNOSIS — J0100 Acute maxillary sinusitis, unspecified: Secondary | ICD-10-CM

## 2013-04-06 MED ORDER — AZITHROMYCIN 250 MG PO TABS: ORAL_TABLET | ORAL | Status: DC

## 2013-04-06 MED ORDER — BENZONATATE 100 MG PO CAPS: 100.0000 mg | ORAL_CAPSULE | Freq: Three times a day (TID) | ORAL | Status: DC | PRN

## 2013-04-29 ENCOUNTER — Encounter: Payer: Self-pay | Admitting: General Practice

## 2013-04-29 ENCOUNTER — Ambulatory Visit (INDEPENDENT_AMBULATORY_CARE_PROVIDER_SITE_OTHER): Payer: Medicare Other | Admitting: General Practice

## 2013-04-29 VITALS — BP 137/72 | HR 69 | Temp 96.7°F | Ht 65.0 in | Wt 193.0 lb

## 2013-04-29 DIAGNOSIS — E119 Type 2 diabetes mellitus without complications: Secondary | ICD-10-CM | POA: Diagnosis not present

## 2013-04-29 DIAGNOSIS — J019 Acute sinusitis, unspecified: Secondary | ICD-10-CM | POA: Diagnosis not present

## 2013-04-29 MED ORDER — METFORMIN HCL 500 MG PO TABS: 500.0000 mg | ORAL_TABLET | Freq: Two times a day (BID) | ORAL | Status: DC

## 2013-04-29 MED ORDER — AZITHROMYCIN 250 MG PO TABS: ORAL_TABLET | ORAL | Status: DC

## 2013-04-29 NOTE — Patient Instructions (Signed)

## 2013-04-29 NOTE — Progress Notes (Signed)
   Subjective:    Patient ID: Jordan [name], female    DOB: 1940/08/10, 73 y.o.   MRN: [id number]  Cough Pertinent negatives include no chest pain, chills, headaches, sore throat or shortness of breath.  Sinusitis This is a new problem. The current episode started in the past 7 days. The problem is unchanged. There has been no fever. Associated symptoms include congestion, coughing and sinus pressure. Pertinent negatives include no chills, headaches, shortness of breath or sore throat. Past treatments include nothing.  Also reports needing refill of metformin.     Review of Systems  Constitutional: Negative for chills.  HENT: Positive for congestion and sinus pressure. Negative for sore throat.   Respiratory: Positive for cough. Negative for chest tightness and shortness of breath.   Cardiovascular: Negative for chest pain and palpitations.  Neurological: Negative for dizziness, weakness and headaches.       Objective:   Physical Exam  Constitutional: She is oriented to person, place, and time. She appears well-developed and well-nourished.  HENT:  Head: Normocephalic and atraumatic.  Right Ear: External ear normal.  Left Ear: External ear normal.  Nose: Right sinus exhibits maxillary sinus tenderness and frontal sinus tenderness. Left sinus exhibits maxillary sinus tenderness and frontal sinus tenderness.  Mouth/Throat: Oropharynx is clear and moist.  Cardiovascular: Normal rate, regular rhythm and normal heart sounds.   Pulmonary/Chest: Effort normal and breath sounds normal. No respiratory distress. She exhibits no tenderness.  Neurological: She is alert and oriented to person, place, and time.  Skin: Skin is warm and dry.  Psychiatric: She has a normal mood and affect.          Assessment & Plan:  1. Sinusitis, acute  - azithromycin (ZITHROMAX) 250 MG tablet; Take as directed  Dispense: 6 tablet; Refill: 0  2. Diabetes mellitus, type 2  - metFORMIN (GLUCOPHAGE) 500 MG  tablet; Take 1 tablet (500 mg total) by mouth 2 (two) times daily with a meal.  Dispense: 60 tablet; Refill: 4 -take medications as prescribed -RTO if symptoms worsen or unresolved Patient verbalized understanding Erby Pian, FNP-C

## 2013-05-03 DIAGNOSIS — B351 Tinea unguium: Secondary | ICD-10-CM | POA: Diagnosis not present

## 2013-05-03 DIAGNOSIS — E1149 Type 2 diabetes mellitus with other diabetic neurological complication: Secondary | ICD-10-CM | POA: Diagnosis not present

## 2013-05-03 DIAGNOSIS — L851 Acquired keratosis [keratoderma] palmaris et plantaris: Secondary | ICD-10-CM | POA: Diagnosis not present

## 2013-07-18 ENCOUNTER — Ambulatory Visit (INDEPENDENT_AMBULATORY_CARE_PROVIDER_SITE_OTHER): Payer: Medicare Other | Admitting: Family Medicine

## 2013-07-18 ENCOUNTER — Encounter: Payer: Self-pay | Admitting: Family Medicine

## 2013-07-18 VITALS — BP 121/52 | HR 74 | Temp 99.2°F | Ht 65.0 in | Wt 193.4 lb

## 2013-07-18 DIAGNOSIS — J209 Acute bronchitis, unspecified: Secondary | ICD-10-CM | POA: Diagnosis not present

## 2013-07-18 DIAGNOSIS — J449 Chronic obstructive pulmonary disease, unspecified: Secondary | ICD-10-CM | POA: Diagnosis not present

## 2013-07-18 MED ORDER — METHYLPREDNISOLONE ACETATE 80 MG/ML IJ SUSP
80.0000 mg | Freq: Once | INTRAMUSCULAR | Status: AC
  Administered 2013-07-18: 80 mg via INTRAMUSCULAR

## 2013-07-18 MED ORDER — HYDROCODONE-HOMATROPINE 5-1.5 MG/5ML PO SYRP: 5.0000 mL | ORAL_SOLUTION | Freq: Three times a day (TID) | ORAL | Status: DC | PRN

## 2013-07-18 MED ORDER — AZITHROMYCIN 250 MG PO TABS: ORAL_TABLET | ORAL | Status: DC

## 2013-07-18 NOTE — Progress Notes (Signed)
   Subjective:    Patient ID: Jordan [name], female    DOB: May 05, 1940, 73 y.o.   MRN: [id number]  HPI This 73 y.o. female presents for evaluation of cough and uri sx's.   Review of Systems No chest pain, SOB, HA, dizziness, vision change, N/V, diarrhea, constipation, dysuria, urinary urgency or frequency, myalgias, arthralgias or rash.     Objective:   Physical Exam  Vital signs noted  Well developed well nourished female.  HEENT - Head atraumatic Normocephalic                Eyes - PERRLA, Conjuctiva - clear Sclera- Clear EOMI                Ears - EAC's Wnl TM's Wnl Gross Hearing WNL                Nose - Nares patent                 Throat - oropharanx wnl Respiratory - Lungs CTA bilateral Cardiac - RRR S1 and S2 without murmur GI - Abdomen soft Nontender and bowel sounds active x 4 Extremities - No edema. Neuro - Grossly intact.      Assessment & Plan:  COPD (chronic obstructive pulmonary disease) - Plan: methylPREDNISolone acetate (DEPO-MEDROL) injection 80 mg, azithromycin (ZITHROMAX) 250 MG tablet, HYDROcodone-homatropine (HYCODAN) 5-1.5 MG/5ML syrup  Acute bronchitis - Plan: methylPREDNISolone acetate (DEPO-MEDROL) injection 80 mg, azithromycin (ZITHROMAX) 250 MG tablet, HYDROcodone-homatropine (HYCODAN) 5-1.5 MG/5ML syrup  Push po fluids, rest, tylenol and motrin otc prn as directed for fever, arthralgias, and myalgias.  Follow up prn if sx's continue or persist.  Lysbeth Penner FNP

## 2013-08-10 ENCOUNTER — Encounter: Payer: Self-pay | Admitting: *Deleted

## 2013-08-18 ENCOUNTER — Ambulatory Visit (INDEPENDENT_AMBULATORY_CARE_PROVIDER_SITE_OTHER)
Admission: RE | Admit: 2013-08-18 | Discharge: 2013-08-18 | Disposition: A | Discharge status: Home / Self Care | Payer: Medicare Other | Source: Ambulatory Visit | Attending: Internal Medicine | Admitting: Internal Medicine

## 2013-08-18 ENCOUNTER — Encounter: Payer: Self-pay | Admitting: Internal Medicine

## 2013-08-18 ENCOUNTER — Ambulatory Visit (INDEPENDENT_AMBULATORY_CARE_PROVIDER_SITE_OTHER): Payer: Medicare Other | Admitting: Internal Medicine

## 2013-08-18 VITALS — BP 126/78 | HR 54 | Ht 65.0 in | Wt 193.4 lb

## 2013-08-18 DIAGNOSIS — J449 Chronic obstructive pulmonary disease, unspecified: Secondary | ICD-10-CM | POA: Diagnosis not present

## 2013-08-18 DIAGNOSIS — J309 Allergic rhinitis, unspecified: Secondary | ICD-10-CM

## 2013-08-18 DIAGNOSIS — J302 Other seasonal allergic rhinitis: Secondary | ICD-10-CM | POA: Insufficient documentation

## 2013-08-18 NOTE — Assessment & Plan Note (Signed)
Controlled but not resolved. Meds are adequate and she is satisfied. Plan- [place] room air needed for documentation for DME. CXR.

## 2013-08-18 NOTE — Assessment & Plan Note (Signed)
Expects flare nasal congestion and drainage each spring and fall.  Plan- return in fall season for assessment.

## 2013-08-18 NOTE — Patient Instructions (Signed)
Order- CXR dx COPD  Order- DME [facility]   ONOX on room air for O2 recert

## 2013-08-18 NOTE — Progress Notes (Signed)
10/06/12- 73 yoF former smoler referred courtesy of Dr Laurance Flatten at [facility] for COPD.  Dr Morrie Sheldon; COPD. [place]. Friend here Childhood asthma and allergy managed at [facility] until resolved around age 73. Subsequently 3ppd smoker until quit in 2000. Treated episodically until hospitalized with acute COPD exacerbation Oct, 2013 and sent home with continuous O2 2L/ [facility]. Depends on nebulizer 4-5x/day. Moved here 3 weeks ago, into an older house, central air, no mold. Feeling more short of breath with ADLs and noting daily cough thick white phlegm. Feet swell at times. Gets sustained chest tight, and may note tachycardia after neb.No blood, fever, purulent or exertional chest pain.  Diabetic, hypertensive, but denies heart disease.  May have had pneumonia, never pneumovax.  10/22/12-  73 yoF former smoker referred courtesy of Dr Laurance Flatten at [facility] for COPD FOLLOWS ION:[id number] much better since last visit!!! "Feels like a new person" Feeling much better. Still some shortness of breath in the shower. Has home oxygen from the hospital, using when necessary now at 2 L/ [facility]. She did not qualify for home oxygen during exertion with exercise test by Dr. Laurance Flatten. CXR 10/18/12 IMPRESSION:  No acute findings.  Original Report Authenticated By: Skipper Cliche, M.D.  11/19/12- 73 yoF former smoker referred courtesy of Dr Laurance Flatten at [facility] for COPD FOLLOWS FOR:  Breathing unchanged since last OV.  Having construction in home, so as of yesterday chest feeling heavy due the dust  Continues oxygen 2 L for sleep/ [place].  02/18/13- 73 yoF former smoker referred courtesy of Dr Laurance Flatten at [facility] for COPD FOLLOWS FOR: drainage at night causing a cough-feels like she is taking a cold Continues oxygen 2 L/[facility]. Breathing has been well-controlled with occasional mild wheeze. She only uses Dulera once or twice daily, keeping dose down to avoid shaking. Nebulizer twice  daily.  08/18/13- 73 yoF former smoker referred courtesy of Dr Laurance Flatten at [facility] for [place]: having to stay indoors more often-hot weather causing her to have trouble breathing. DME is [facility]-needs to have ONO repeated for [facility] documentation and pt states she uses her O2 2L during the day as well only as needed. Little cough, some postnasal drip. Daily zyrtec.Using neb 1-2x daily and occ rescue inhaler. She says control is adequate.  ROS-see HPI Constitutional:   No-   weight loss, night sweats, fevers, chills, fatigue, lassitude. HEENT:   No- headaches, difficulty swallowing, tooth/dental problems, sore throat,       No- sneezing,no- itching, ear ache, +nasal congestion, post nasal drip,  CV:  No-chest pain, orthopnea, PND, no-swelling in lower extremities, anasarca, dizziness, +palpitations Resp: +shortness of breath with exertion or at rest.              No- productive cough,  + non-productive cough,  No- coughing up of blood.              No-   change in color of mucus.  + wheezing.   Skin: No-   rash or lesions. GI:  No-   heartburn, indigestion, abdominal pain, nausea, vomiting,  GU:  MS:  No-   joint pain or swelling.  . Neuro-  +tremor with sympathetic meds Psych:  No- change in mood or affect. +depression or anxiety.  No memory loss.  OBJ- Physical Exam  General- Alert, Oriented, Affect+mildly anxious, Distress- none acute,  Skin- rash-none, lesions- none, excoriation- none Lymphadenopathy- none Head- atraumatic  Eyes- Gross vision intact, PERRLA, conjunctivae and secretions clear            Ears- Hearing, canals-normal            Nose- Clear, no-Septal dev, mucus, polyps, erosion, perforation             Throat- Mallampati II , mucosa clear , drainage- none, tonsils- atrophic Neck- flexible , trachea midline, no stridor , thyroid nl, carotid no bruit Chest - symmetrical excursion , unlabored           Heart/CV- RRR , no murmur , no gallop  , no rub, nl s1 s2                            - JVD- none , edema-+trace, stasis changes- none, varices- none           Lung- +diminished/ unlabored room air/ trace exp wheeze., cough- none , dullness-none, rub- none           Chest wall-  Abd-  Br/ Gen/ Rectal- Not done, not indicated Extrem- cyanosis- none, clubbing, none, atrophy- none, strength- nl Neuro- grossly intact to observation

## 2013-08-23 DIAGNOSIS — J449 Chronic obstructive pulmonary disease, unspecified: Secondary | ICD-10-CM | POA: Diagnosis not present

## 2013-08-30 ENCOUNTER — Ambulatory Visit: Payer: Medicare Other | Admitting: Physician Assistant

## 2013-08-30 ENCOUNTER — Ambulatory Visit: Payer: Medicare Other | Admitting: General Practice

## 2013-09-05 ENCOUNTER — Telehealth: Payer: Self-pay | Admitting: Internal Medicine

## 2013-09-08 ENCOUNTER — Other Ambulatory Visit: Payer: Self-pay | Admitting: Nurse Practitioner

## 2013-09-08 NOTE — Telephone Encounter (Signed)
Spoke with patient-she is aware to continue using her O2.

## 2013-10-11 ENCOUNTER — Encounter: Payer: Self-pay | Admitting: Internal Medicine

## 2013-10-13 ENCOUNTER — Other Ambulatory Visit: Payer: Self-pay | Admitting: Internal Medicine

## 2013-10-18 NOTE — Telephone Encounter (Signed)
Ok to refill 

## 2013-10-18 NOTE — Telephone Encounter (Signed)
Pharm calling to check on status of refill request [phone].Jordan [name]

## 2013-11-17 ENCOUNTER — Ambulatory Visit: Payer: Medicare Other | Admitting: Internal Medicine

## 2013-11-24 ENCOUNTER — Ambulatory Visit: Payer: Medicare Other | Admitting: Family Medicine

## 2013-12-07 ENCOUNTER — Other Ambulatory Visit: Payer: Self-pay | Admitting: Nurse Practitioner

## 2013-12-07 NOTE — Telephone Encounter (Signed)
Last seen 07/18/13  B [place]  Requesting 90 day supply

## 2013-12-08 ENCOUNTER — Ambulatory Visit (INDEPENDENT_AMBULATORY_CARE_PROVIDER_SITE_OTHER): Payer: Medicare Other | Admitting: Family Medicine

## 2013-12-08 ENCOUNTER — Encounter: Payer: Self-pay | Admitting: Family Medicine

## 2013-12-08 VITALS — BP 119/60 | HR 60 | Temp 97.1°F | Ht 65.0 in | Wt 188.0 lb

## 2013-12-08 DIAGNOSIS — E079 Disorder of thyroid, unspecified: Secondary | ICD-10-CM

## 2013-12-08 DIAGNOSIS — E039 Hypothyroidism, unspecified: Secondary | ICD-10-CM | POA: Insufficient documentation

## 2013-12-08 DIAGNOSIS — E119 Type 2 diabetes mellitus without complications: Secondary | ICD-10-CM

## 2013-12-08 DIAGNOSIS — F419 Anxiety disorder, unspecified: Secondary | ICD-10-CM | POA: Insufficient documentation

## 2013-12-08 DIAGNOSIS — F32A Depression, unspecified: Secondary | ICD-10-CM | POA: Insufficient documentation

## 2013-12-08 DIAGNOSIS — I10 Essential (primary) hypertension: Secondary | ICD-10-CM | POA: Diagnosis not present

## 2013-12-08 DIAGNOSIS — F329 Major depressive disorder, single episode, unspecified: Secondary | ICD-10-CM | POA: Insufficient documentation

## 2013-12-08 DIAGNOSIS — E785 Hyperlipidemia, unspecified: Secondary | ICD-10-CM

## 2013-12-08 DIAGNOSIS — H579 Unspecified disorder of eye and adnexa: Secondary | ICD-10-CM

## 2013-12-08 LAB — POCT GLYCOSYLATED HEMOGLOBIN (HGB A1C): HEMOGLOBIN A1C: 5.8

## 2013-12-08 MED ORDER — ALPRAZOLAM 0.5 MG PO TABS: ORAL_TABLET | ORAL | Status: DC

## 2013-12-08 MED ORDER — AZITHROMYCIN 250 MG PO TABS: ORAL_TABLET | ORAL | Status: DC

## 2013-12-08 MED ORDER — HYDROCODONE-HOMATROPINE 5-1.5 MG/5ML PO SYRP: 5.0000 mL | ORAL_SOLUTION | Freq: Three times a day (TID) | ORAL | Status: DC | PRN

## 2013-12-08 NOTE — Progress Notes (Signed)
   Subjective:    Patient ID: Jordan [name], female    DOB: 1941-02-02, 74 y.o.   MRN: [id number]  HPI 73 year old female who is here today to followup chronic problems which include hypertension, hypothyroidism, COPD, and diabetes. She is seen by pulmonologist as well. In review of her labs, TSH was elevated last December but her medication was not adjusted. She says that she is doing well in regards to her COPD but has noted increasing cough and darker sputum recently.    Review of Systems  Respiratory: Positive for cough.   Psychiatric/Behavioral: The patient is nervous/anxious.        Objective:   Physical Exam  Constitutional: She appears well-developed.  HENT:  Head: Normocephalic.  Neck: Normal range of motion.  Cardiovascular: Normal rate and regular rhythm.   Pulmonary/Chest: She has wheezes.  Musculoskeletal:  Walks with cane due to prior ankle injury  Neurological: She is alert.  Concerns re memory; recalled 2 of 3 items at 5 min    BP 119/60  Pulse 60  Temp(Src) 97.1 F (36.2 C) (Oral)  Ht 5\' 5"  (1.651 m)  Wt 188 lb (85.276 kg)  BMI 31.28 kg/m2  SpO2 95%      Assessment & Plan:  1. Thyroid disease Will probably increase supplement - TSH  2. Essential hypertension   3. Hyperlipidemia Another trial, but at 20 mg atovastatin, twicw a week  4. Diabetes mellitus without complication  - POCT glycosylated hemoglobin (Hb A1C)  Wardell Honour MD

## 2013-12-08 NOTE — Addendum Note (Signed)
Addended by: Ilean China on: 12/08/2013 05:21 PM   Modules accepted: Orders

## 2013-12-09 LAB — TSH: TSH: 1.96 u[IU]/mL (ref 0.450–4.500)

## 2013-12-12 ENCOUNTER — Telehealth: Payer: Self-pay | Admitting: Family Medicine

## 2013-12-12 NOTE — Telephone Encounter (Signed)
Message copied by Waverly Ferrari on Mon Dec 12, 2013 11:23 AM ------      Message from: Wardell Honour      Created: Fri Dec 09, 2013  7:51 AM       Hemoglobin A1c is good and her TSH is also normal. I do not understand why it was elevated 9 months ago but normal female with no change in her medication but I would not recommend any change in dosage at this time. ------

## 2013-12-27 DIAGNOSIS — H2513 Age-related nuclear cataract, bilateral: Secondary | ICD-10-CM | POA: Diagnosis not present

## 2013-12-27 DIAGNOSIS — H3531 Nonexudative age-related macular degeneration: Secondary | ICD-10-CM | POA: Diagnosis not present

## 2013-12-27 DIAGNOSIS — E119 Type 2 diabetes mellitus without complications: Secondary | ICD-10-CM | POA: Diagnosis not present

## 2014-01-12 ENCOUNTER — Other Ambulatory Visit: Payer: Self-pay | Admitting: Family Medicine

## 2014-01-17 ENCOUNTER — Ambulatory Visit (INDEPENDENT_AMBULATORY_CARE_PROVIDER_SITE_OTHER): Payer: Medicare Other | Admitting: Family Medicine

## 2014-01-17 ENCOUNTER — Encounter: Payer: Self-pay | Admitting: Family Medicine

## 2014-01-17 ENCOUNTER — Ambulatory Visit: Payer: Medicare Other | Admitting: Family Medicine

## 2014-01-17 VITALS — BP 139/66 | HR 77 | Temp 97.7°F | Ht 65.0 in | Wt 188.6 lb

## 2014-01-17 DIAGNOSIS — R7989 Other specified abnormal findings of blood chemistry: Secondary | ICD-10-CM | POA: Diagnosis not present

## 2014-01-17 DIAGNOSIS — J069 Acute upper respiratory infection, unspecified: Secondary | ICD-10-CM | POA: Diagnosis not present

## 2014-01-17 DIAGNOSIS — F329 Major depressive disorder, single episode, unspecified: Secondary | ICD-10-CM

## 2014-01-17 DIAGNOSIS — F32A Depression, unspecified: Secondary | ICD-10-CM

## 2014-01-17 DIAGNOSIS — E559 Vitamin D deficiency, unspecified: Secondary | ICD-10-CM | POA: Diagnosis not present

## 2014-01-17 DIAGNOSIS — H04123 Dry eye syndrome of bilateral lacrimal glands: Secondary | ICD-10-CM | POA: Diagnosis not present

## 2014-01-17 LAB — POCT UA - MICROSCOPIC ONLY
Bacteria, U Microscopic: NEGATIVE
Casts, Ur, LPF, POC: NEGATIVE
Crystals, Ur, HPF, POC: NEGATIVE
Mucus, UA: NEGATIVE
RBC, urine, microscopic: NEGATIVE
WBC, Ur, HPF, POC: NEGATIVE
Yeast, UA: NEGATIVE

## 2014-01-17 LAB — POCT URINALYSIS DIPSTICK
Bilirubin, UA: NEGATIVE
Blood, UA: NEGATIVE
Glucose, UA: NEGATIVE
Ketones, UA: NEGATIVE
Leukocytes, UA: NEGATIVE
Nitrite, UA: NEGATIVE
Protein, UA: NEGATIVE
Spec Grav, UA: <=1.005
Urobilinogen, UA: NEGATIVE
pH, UA: 5

## 2014-01-17 MED ORDER — PAROXETINE HCL 40 MG PO TABS: 40.0000 mg | ORAL_TABLET | ORAL | Status: DC

## 2014-01-17 MED ORDER — AZITHROMYCIN 250 MG PO TABS: ORAL_TABLET | ORAL | Status: DC

## 2014-01-17 MED ORDER — ALPRAZOLAM 0.5 MG PO TABS: ORAL_TABLET | ORAL | Status: DC

## 2014-01-17 NOTE — Addendum Note (Signed)
Addended by: Selmer Dominion on: 01/17/2014 02:54 PM   Modules accepted: Orders

## 2014-01-17 NOTE — Progress Notes (Signed)
   Subjective:    Patient ID: Jordan [name], female    DOB: 28-Jul-1940, 73 y.o.   MRN: [id number]  HPI Patient is here with c/o depression and anxiety and states her medicine is no longer working.  She is taking paxil and she states it is not helping.  She has feelings like she wishes she would die or has dark thoughts.  She is c/o uri sx's.  Review of Systems  Constitutional: Negative for fever.  HENT: Negative for ear pain.   Eyes: Negative for discharge.  Respiratory: Negative for cough.   Cardiovascular: Negative for chest pain.  Gastrointestinal: Negative for abdominal distention.  Endocrine: Negative for polyuria.  Genitourinary: Negative for difficulty urinating.  Musculoskeletal: Negative for gait problem and neck pain.  Skin: Negative for color change and rash.  Neurological: Negative for speech difficulty and headaches.  Psychiatric/Behavioral: Negative for agitation.       Objective:    BP 139/66 mmHg  Pulse 77  Temp(Src) 97.7 F (36.5 C) (Oral)  Ht 5\' 5"  (1.651 m)  Wt 188 lb 9.6 oz (85.548 kg)  BMI 31.38 kg/m2   Physical Exam  Constitutional: She is oriented to person, place, and time. She appears well-developed and well-nourished.  HENT:  Head: Normocephalic and atraumatic.  Mouth/Throat: Oropharynx is clear and moist.  Eyes: Pupils are equal, round, and reactive to light.  Neck: Normal range of motion. Neck supple.  Cardiovascular: Normal rate and regular rhythm.   No murmur heard. Pulmonary/Chest: Effort normal and breath sounds normal.  Abdominal: Soft. Bowel sounds are normal. There is no tenderness.  Neurological: She is alert and oriented to person, place, and time.  Skin: Skin is warm and dry.  Psychiatric: She has a normal mood and affect.          Assessment & Plan:     ICD-9-CM ICD-10-CM   1. Depression 311 F32.9 PARoxetine (PAXIL) 40 MG tablet     ALPRAZolam (XANAX) 0.5 MG tablet     POCT CBC     TSH     Vit D  25 hydroxy (rtn  osteoporosis monitoring)     Vitamin B12     POCT urinalysis dipstick     POCT UA - Microscopic Only  2. URI (upper respiratory infection) 465.9 J06.9 azithromycin (ZITHROMAX) 250 MG tablet   Push po fluids, rest, tylenol and motrin otc prn as directed for fever, arthralgias, and myalgias.  Follow up prn if sx's continue or persist.  No Follow-up on file.  Lysbeth Penner FNP

## 2014-01-18 ENCOUNTER — Other Ambulatory Visit: Payer: Self-pay | Admitting: Family Medicine

## 2014-01-18 ENCOUNTER — Telehealth: Payer: Self-pay | Admitting: *Deleted

## 2014-01-18 LAB — CBC WITH DIFFERENTIAL
Basophils Absolute: 0.1 10*3/uL (ref 0.0–0.2)
Basos: 1 %
Eos: 7 %
Eosinophils Absolute: 0.6 10*3/uL — ABNORMAL HIGH (ref 0.0–0.4)
HCT: 40.3 % (ref 34.0–46.6)
Hemoglobin: 13 g/dL (ref 11.1–15.9)
Immature Grans (Abs): 0 10*3/uL (ref 0.0–0.1)
Immature Granulocytes: 0 %
Lymphocytes Absolute: 1.7 10*3/uL (ref 0.7–3.1)
Lymphs: 19 %
MCH: 31.3 pg (ref 26.6–33.0)
MCHC: 32.3 g/dL (ref 31.5–35.7)
MCV: 97 fL (ref 79–97)
Monocytes Absolute: 0.7 10*3/uL (ref 0.1–0.9)
Monocytes: 8 %
Neutrophils Absolute: 6.1 10*3/uL (ref 1.4–7.0)
Neutrophils Relative %: 65 %
Platelets: 630 10*3/uL — ABNORMAL HIGH (ref 150–379)
RBC: 4.16 x10E6/uL (ref 3.77–5.28)
RDW: 16.2 % — ABNORMAL HIGH (ref 12.3–15.4)
WBC: 9.3 10*3/uL (ref 3.4–10.8)

## 2014-01-18 LAB — VITAMIN D 25 HYDROXY (VIT D DEFICIENCY, FRACTURES): Vit D, 25-Hydroxy: 7.5 ng/mL — ABNORMAL LOW (ref 30.0–100.0)

## 2014-01-18 LAB — TSH: TSH: 4.8 u[IU]/mL — ABNORMAL HIGH (ref 0.450–4.500)

## 2014-01-18 LAB — VITAMIN B12: Vitamin B-12: 168 pg/mL — ABNORMAL LOW (ref 211–946)

## 2014-01-18 MED ORDER — VITAMIN D (ERGOCALCIFEROL) 1.25 MG (50000 UNIT) PO CAPS: 50000.0000 [IU] | ORAL_CAPSULE | ORAL | Status: DC

## 2014-01-18 MED ORDER — LEVOTHYROXINE SODIUM 112 MCG PO TABS: 112.0000 ug | ORAL_TABLET | Freq: Every day | ORAL | Status: DC

## 2014-01-18 MED ORDER — CYANOCOBALAMIN 1000 MCG/ML IJ SOLN: INTRAMUSCULAR | Status: DC

## 2014-01-18 NOTE — Telephone Encounter (Signed)
Aware of new scripts and lab results.

## 2014-01-18 NOTE — Telephone Encounter (Signed)
-----   Message from Lysbeth Penner, FNP sent at 01/18/2014 10:06 AM EST ----- Levothyroxine increased to 121mcg po qd due to TSH elevated, b12 low and b12 injections ordered, vit D low and vit D sent to pharm along with b12, take vit D 2000 iu po qd otc, labs ok otherwise

## 2014-01-19 ENCOUNTER — Telehealth: Payer: Self-pay | Admitting: Family Medicine

## 2014-01-19 ENCOUNTER — Encounter: Payer: Self-pay | Admitting: Internal Medicine

## 2014-01-19 ENCOUNTER — Ambulatory Visit (INDEPENDENT_AMBULATORY_CARE_PROVIDER_SITE_OTHER): Payer: Medicare Other | Admitting: Internal Medicine

## 2014-01-19 ENCOUNTER — Ambulatory Visit (INDEPENDENT_AMBULATORY_CARE_PROVIDER_SITE_OTHER): Payer: Medicare Other | Admitting: *Deleted

## 2014-01-19 VITALS — BP 138/86 | HR 61 | Ht 66.0 in | Wt 186.0 lb

## 2014-01-19 DIAGNOSIS — J302 Other seasonal allergic rhinitis: Secondary | ICD-10-CM

## 2014-01-19 DIAGNOSIS — Z23 Encounter for immunization: Secondary | ICD-10-CM | POA: Diagnosis not present

## 2014-01-19 DIAGNOSIS — J449 Chronic obstructive pulmonary disease, unspecified: Secondary | ICD-10-CM | POA: Diagnosis not present

## 2014-01-19 NOTE — Telephone Encounter (Signed)
FYI,     Patient's daughter will give her the B-12 injections.

## 2014-01-19 NOTE — Progress Notes (Signed)
10/06/12- 50 yoF former smoler referred courtesy of Dr Laurance Flatten at [facility] for COPD.  Dr Morrie Sheldon; COPD. [place] Friend [place] Childhood asthma and allergy managed at [facility] until resolved around age 73. Subsequently 3ppd smoker until quit in 2000. Treated episodically until hospitalized with acute COPD exacerbation Oct, 2013 and sent home with continuous O2 2L/ [facility]. Depends on nebulizer 4-5x/day. Moved here 3 weeks ago, into an older house, central air, no mold. Feeling more short of breath with ADLs and noting daily cough thick white phlegm. Feet swell at times. Gets sustained chest tight, and may note tachycardia after neb.No blood, fever, purulent or exertional chest pain.  Diabetic, hypertensive, but denies heart disease.  May have had pneumonia, never pneumovax.  10/22/12-  36 yoF former smoker referred courtesy of Dr Laurance Flatten at [facility] for COPD FOLLOWS [id number] much better since last visit!!! "Feels like a new person" Feeling much better. Still some shortness of breath in the shower. Has home oxygen from the hospital, using when necessary now at 2 L/ [facility]. She did not qualify for home oxygen during exertion with exercise test by Dr. Laurance Flatten. CXR 10/18/12 IMPRESSION:  No acute findings.  Original Report Authenticated By: Skipper Cliche, M.D.  11/19/12- 38 yoF former smoker referred courtesy of Dr Laurance Flatten at [facility] for COPD FOLLOWS FOR:  Breathing unchanged since last OV.  Having construction in home, so as of yesterday chest feeling heavy due the dust  Continues oxygen 2 L for sleep/ [place].  02/18/13- 4 yoF former smoker referred courtesy of Dr Laurance Flatten at [facility] for COPD FOLLOWS FOR: drainage at night causing a cough-feels like she is taking a cold Continues oxygen 2 L/[facility]. Breathing has been well-controlled with occasional mild wheeze. She only uses Dulera once or twice daily, keeping dose down to avoid shaking. Nebulizer twice  daily.  08/18/13- 70 yoF former smoker referred courtesy of Dr Laurance Flatten at [facility] for [place]: having to stay indoors more often-hot weather causing her to have trouble breathing. DME is [facility]-needs to have ONO repeated for M'care documentation and pt states she uses her O2 2L during the day as well only as needed. Little cough, some postnasal drip. Daily zyrtec.Using neb 1-2x daily and occ rescue inhaler. She says control is adequate.  01/19/14- 84 yoF former smoker referred courtesy of Dr Laurance Flatten at [facility] for COPD FOLLOW FOR: Asthma w/ COPD; breathing doing better, no complaints.  O2 2L/ [facility]. Has Z pak on hold. [place] 08/23/13- Qualified for sleep O2 2L CXR 08/18/13-  IMPRESSION: No active cardiopulmonary disease. Stable mild cardiomegaly and biapical pleural thickening. Electronically Signed  By: Ivar Drape M.D.  On: 08/18/2013 10:20  ROS-see HPI Constitutional:   No-   weight loss, night sweats, fevers, chills, fatigue, lassitude. HEENT:   No- headaches, difficulty swallowing, tooth/dental problems, sore throat,       No- sneezing,no- itching, ear ache, +nasal congestion, post nasal drip,  CV:  No-chest pain, orthopnea, PND, no-swelling in lower extremities, anasarca, dizziness, +palpitations Resp: +shortness of breath with exertion or at rest.              No- productive cough,  + non-productive cough,  No- coughing up of blood.              No-   change in color of mucus.  + wheezing.   Skin: No-   rash or lesions. GI:  No-   heartburn, indigestion, abdominal pain,  nausea, vomiting,  GU:  MS:  No-   joint pain or swelling.  . Neuro-  +tremor with sympathetic meds Psych:  No- change in mood or affect. +depression or anxiety.  No memory loss.  OBJ- Physical Exam  General- Alert, Oriented, Affect+mildly anxious, Distress- none acute,  Skin- rash-none, lesions- none, excoriation- none Lymphadenopathy- none Head- atraumatic            Eyes- Gross vision intact, PERRLA,  conjunctivae and secretions clear            Ears- Hearing, canals-normal            Nose- Clear, no-Septal dev, mucus, polyps, erosion, perforation             Throat- Mallampati II , mucosa clear , drainage- none, tonsils- atrophic Neck- flexible , trachea midline, no stridor , thyroid nl, carotid no bruit Chest - symmetrical excursion , unlabored           Heart/CV- RRR , no murmur , no gallop  , no rub, nl s1 s2                           - JVD- none , edema-+trace, stasis changes- none, varices- none           Lung- +diminished/ unlabored room air/ trace exp wheeze., cough- none , dullness-none, rub- none           Chest wall-  Abd-  Br/ Gen/ Rectal- Not done, not indicated Extrem- cyanosis- none, clubbing, none, atrophy- none, strength- nl Neuro- grossly intact to observation

## 2014-01-19 NOTE — Patient Instructions (Signed)
Flu vax  Ok to take your Z pak as planned  Please call if we can help

## 2014-03-30 DIAGNOSIS — H524 Presbyopia: Secondary | ICD-10-CM | POA: Diagnosis not present

## 2014-03-30 DIAGNOSIS — H2513 Age-related nuclear cataract, bilateral: Secondary | ICD-10-CM | POA: Diagnosis not present

## 2014-03-30 DIAGNOSIS — H04123 Dry eye syndrome of bilateral lacrimal glands: Secondary | ICD-10-CM | POA: Diagnosis not present

## 2014-04-11 IMAGING — CR DG CHEST 2V
2 series · 2 of 2 positions shown · non-contrast
Comparison: None.

CLINICAL DATA: Cough, shortness of breath, chest pain,
hypertension, prior history of smoking

CHEST - 2 VIEW

[view not recorded (1 of 2)]
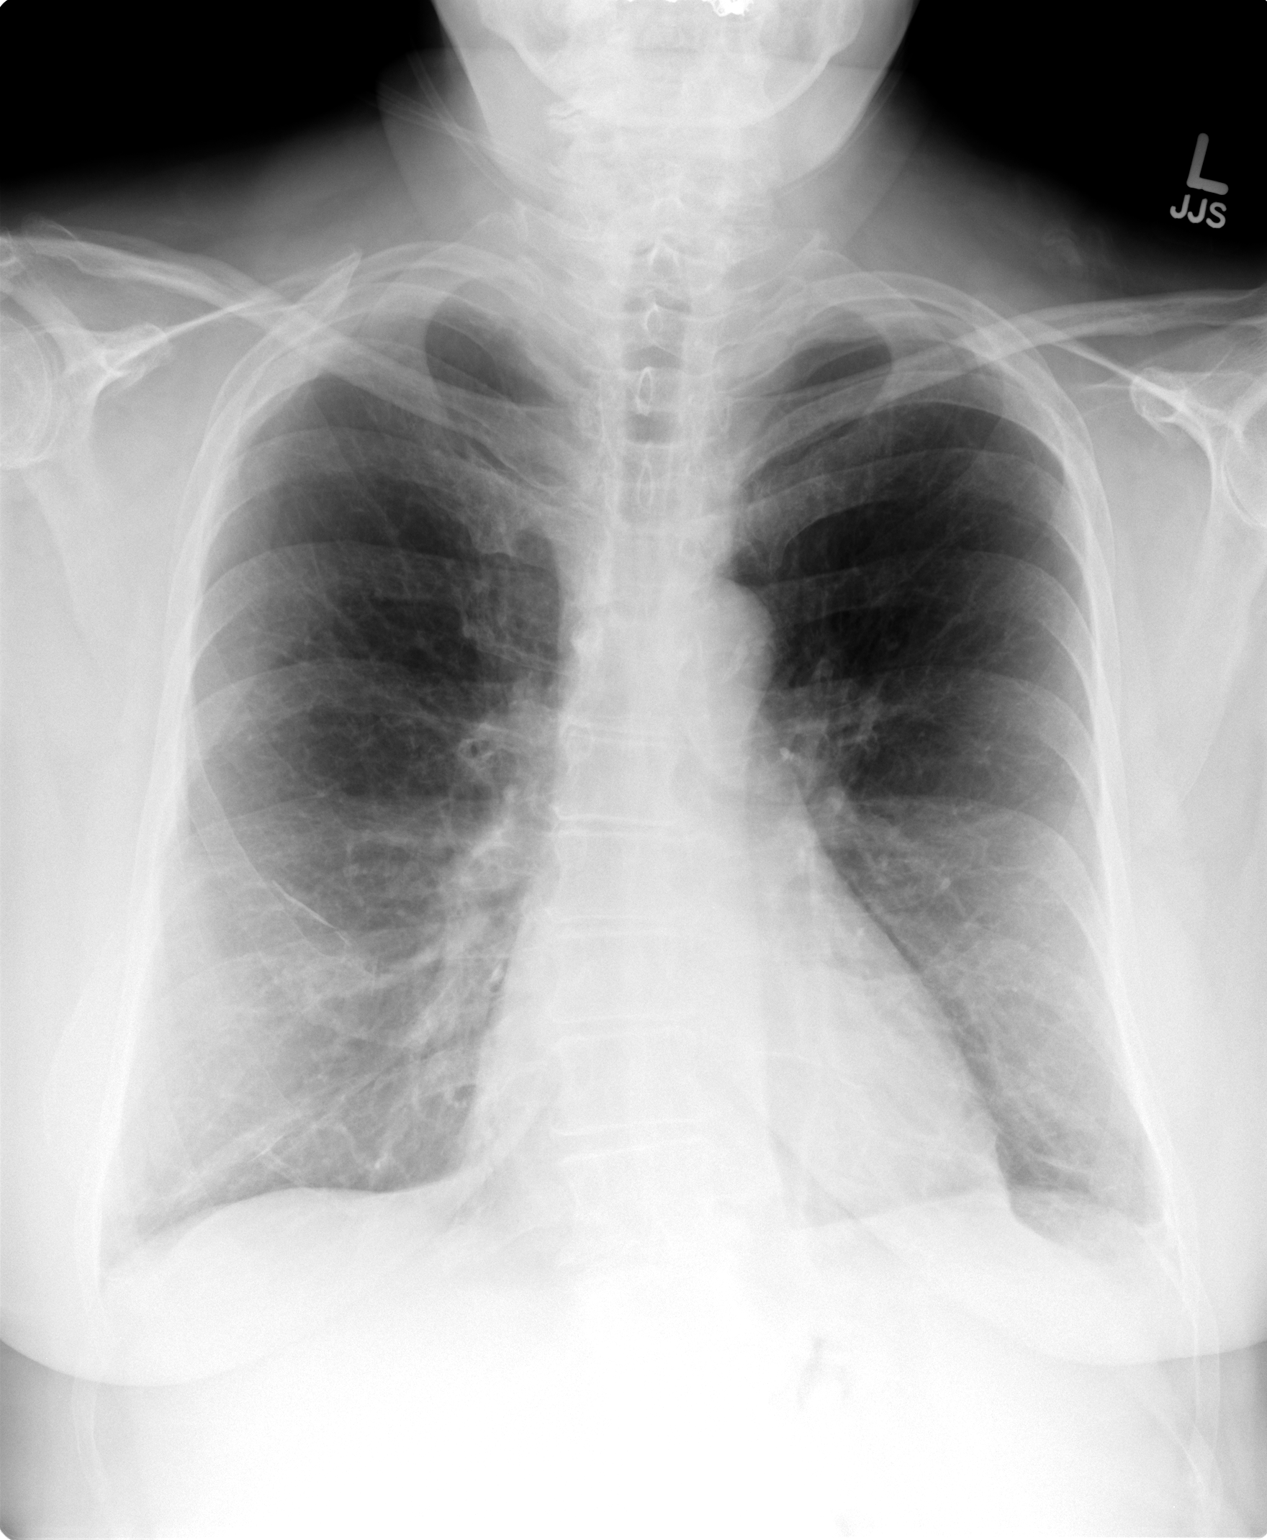

[view not recorded (2 of 2)]
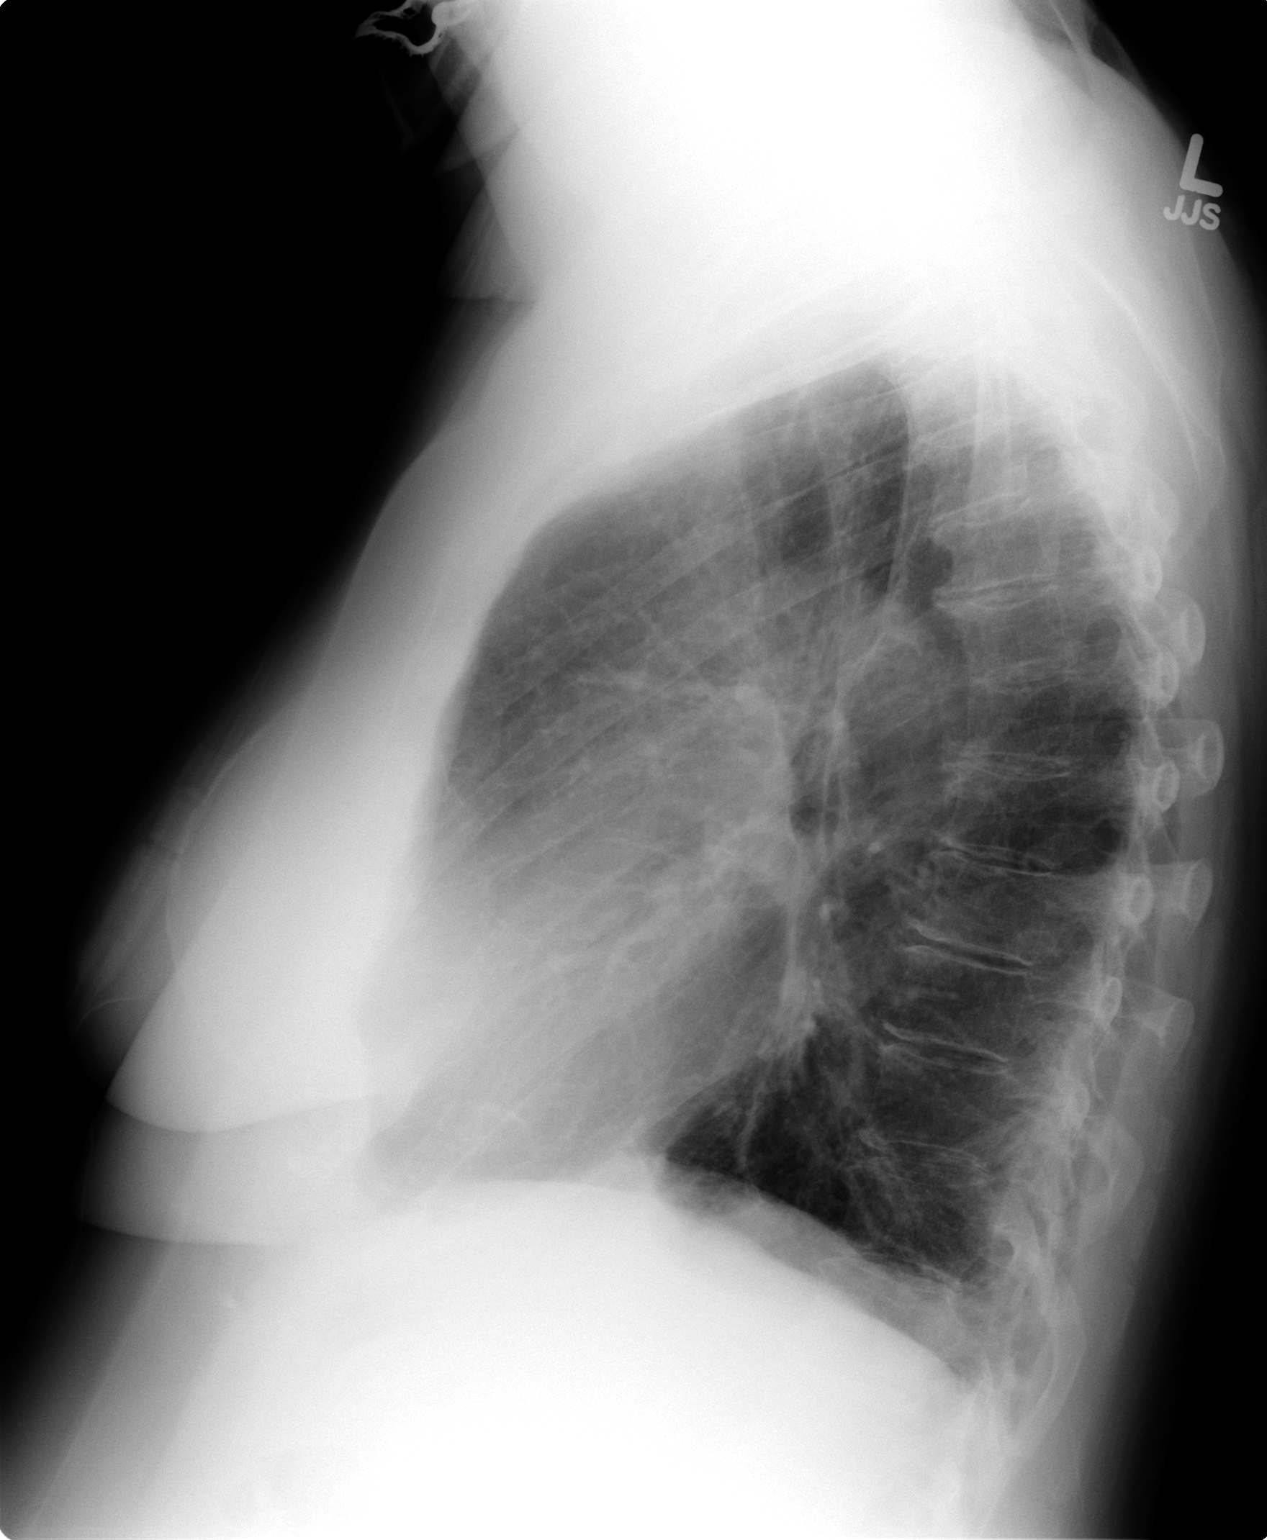

[2 of 2 positions shown; findings below may reference images not displayed]

FINDINGS: Heart size and vascular pattern are normal.
Hyperinflation.  Mild right middle lobe scarring.  Mild bilateral
lower lobe scarring.  Mild dextroscoliosis.  Mild aortic arch
calcification.
IMPRESSION: No acute findings.

## 2014-04-17 DIAGNOSIS — H2512 Age-related nuclear cataract, left eye: Secondary | ICD-10-CM | POA: Diagnosis not present

## 2014-04-25 NOTE — Assessment & Plan Note (Signed)
ONOX confirms she continues to qualify for sleep O2 at 2L and she does use it some in daytime, pacing herself. Probably now at baseline. Medications reviewed. CXR from last visit without active process. Plan- continue present management as discussed

## 2014-04-25 NOTE — Assessment & Plan Note (Signed)
Talked about antihistamines available for spring pollen

## 2014-04-26 DIAGNOSIS — H2512 Age-related nuclear cataract, left eye: Secondary | ICD-10-CM | POA: Diagnosis not present

## 2014-04-26 DIAGNOSIS — H25812 Combined forms of age-related cataract, left eye: Secondary | ICD-10-CM | POA: Diagnosis not present

## 2014-05-02 ENCOUNTER — Other Ambulatory Visit: Payer: Self-pay

## 2014-05-02 MED ORDER — METOPROLOL SUCCINATE ER 25 MG PO TB24: ORAL_TABLET | ORAL | Status: DC

## 2014-05-03 DIAGNOSIS — H2511 Age-related nuclear cataract, right eye: Secondary | ICD-10-CM | POA: Diagnosis not present

## 2014-05-09 DIAGNOSIS — H269 Unspecified cataract: Secondary | ICD-10-CM

## 2014-05-09 HISTORY — DX: Unspecified cataract: H26.9

## 2014-05-10 DIAGNOSIS — H25811 Combined forms of age-related cataract, right eye: Secondary | ICD-10-CM | POA: Diagnosis not present

## 2014-05-10 DIAGNOSIS — H2511 Age-related nuclear cataract, right eye: Secondary | ICD-10-CM | POA: Diagnosis not present

## 2014-06-08 ENCOUNTER — Encounter: Payer: Self-pay | Admitting: Physician Assistant

## 2014-06-08 ENCOUNTER — Telehealth: Payer: Self-pay | Admitting: Physician Assistant

## 2014-06-08 ENCOUNTER — Ambulatory Visit (INDEPENDENT_AMBULATORY_CARE_PROVIDER_SITE_OTHER): Payer: Medicare Other | Admitting: Physician Assistant

## 2014-06-08 ENCOUNTER — Other Ambulatory Visit: Payer: Self-pay | Admitting: Physician Assistant

## 2014-06-08 ENCOUNTER — Ambulatory Visit (INDEPENDENT_AMBULATORY_CARE_PROVIDER_SITE_OTHER): Payer: Medicare Other

## 2014-06-08 VITALS — BP 122/59 | HR 85 | Temp 98.2°F | Ht 66.0 in | Wt 181.6 lb

## 2014-06-08 DIAGNOSIS — R062 Wheezing: Secondary | ICD-10-CM | POA: Diagnosis not present

## 2014-06-08 DIAGNOSIS — R609 Edema, unspecified: Secondary | ICD-10-CM

## 2014-06-08 DIAGNOSIS — R0602 Shortness of breath: Secondary | ICD-10-CM

## 2014-06-08 DIAGNOSIS — I83892 Varicose veins of left lower extremities with other complications: Secondary | ICD-10-CM

## 2014-06-08 MED ORDER — AZITHROMYCIN 250 MG PO TABS: ORAL_TABLET | ORAL | Status: DC

## 2014-06-08 NOTE — Patient Instructions (Signed)
Appt at [facility] 06/09/2014 @ 2:30  Continue to take generic zyrtec.  Albuterol every 4 hours for wheezing.  Elevate legs and apply ice to left leg

## 2014-06-08 NOTE — Progress Notes (Signed)
   Subjective:    Patient ID: Jordan [name], female    DOB: 1940/11/23, 74 y.o.   MRN: [id number]  URI  Associated symptoms include chest pain (shooting pain across chest), coughing and wheezing.  Cough Associated symptoms include chest pain (shooting pain across chest), shortness of breath (chronic COPD) and wheezing.  LE Edema on LLE, very painful x 4 days     Review of Systems  Respiratory: Positive for cough, chest tightness, shortness of breath (chronic COPD) and wheezing.   Cardiovascular: Positive for chest pain (shooting pain across chest) and leg swelling (LLE x 4 days).  Skin: Positive for color change (LLE anterior distal shin).       Raised nodular lesion, patient states extreme burning radiating into her dorsal foot. Patient states it was on her ankle and has moved proximally over the past 3 days. Denies trauma       Objective:   Physical Exam  Constitutional: She is oriented to person, place, and time. She appears well-developed and well-nourished.  HENT:  Mouth/Throat: No oropharyngeal exudate.  Posterior pharynx erythematous   Eyes: Pupils are equal, round, and reactive to light. Right eye exhibits no discharge. Left eye exhibits no discharge.  Neck: Normal range of motion.  Cardiovascular: Normal rate and regular rhythm.   No murmur heard. Pulmonary/Chest: Effort normal. She has wheezes (RUL, RLL expiratory ).  Musculoskeletal: She exhibits edema and tenderness (LLE anterior shin very TTP).  Neurological: She is alert and oriented to person, place, and time. She has normal reflexes.  Skin: There is erythema (mild erythema, violaceous discoloration on anterior L shin).  Psychiatric: She has a normal mood and affect. Her behavior is normal. Judgment and thought content normal.  Nursing note and vitals reviewed.         Assessment & Plan:  1. Right chest/upper abdominal pain: Chest xray negative for abnormalities.  2. COPD Exacerbation: Azithromycin 250mg  as  directed. Albuterol inhaler q 4-6 hours on a continuous basis.  3. LLE pain/ edema: Elevated leg. Vascular doppler ordered, will be completed tomorrow at [facility] to r/o DVT due to extreme TTP and edema. Patient could not go today. Advised her to report to ER if s/s worsen prior to her appt tomorrow.

## 2014-06-09 ENCOUNTER — Other Ambulatory Visit: Payer: Self-pay | Admitting: Nurse Practitioner

## 2014-06-09 ENCOUNTER — Ambulatory Visit (HOSPITAL_COMMUNITY)
Admission: RE | Admit: 2014-06-09 | Discharge: 2014-06-09 | Disposition: A | Discharge status: Home / Self Care | Payer: Medicare Other | Source: Ambulatory Visit | Attending: Physician Assistant | Admitting: Physician Assistant

## 2014-06-09 DIAGNOSIS — M79662 Pain in left lower leg: Secondary | ICD-10-CM | POA: Insufficient documentation

## 2014-06-09 DIAGNOSIS — R600 Localized edema: Secondary | ICD-10-CM | POA: Diagnosis not present

## 2014-06-09 DIAGNOSIS — R609 Edema, unspecified: Secondary | ICD-10-CM | POA: Diagnosis not present

## 2014-06-14 ENCOUNTER — Ambulatory Visit: Payer: Medicare Other | Admitting: Physician Assistant

## 2014-06-15 ENCOUNTER — Ambulatory Visit (INDEPENDENT_AMBULATORY_CARE_PROVIDER_SITE_OTHER): Payer: Medicare Other | Admitting: Physician Assistant

## 2014-06-15 ENCOUNTER — Encounter: Payer: Self-pay | Admitting: Physician Assistant

## 2014-06-15 VITALS — BP 140/64 | HR 54 | Temp 96.9°F | Ht 66.0 in | Wt 184.0 lb

## 2014-06-15 DIAGNOSIS — J309 Allergic rhinitis, unspecified: Secondary | ICD-10-CM

## 2014-06-15 MED ORDER — FLUTICASONE PROPIONATE 50 MCG/ACT NA SUSP: 2.0000 | Freq: Every day | NASAL | Status: DC

## 2014-06-15 MED ORDER — ALBUTEROL SULFATE HFA 108 (90 BASE) MCG/ACT IN AERS: 2.0000 | INHALATION_SPRAY | RESPIRATORY_TRACT | Status: DC | PRN

## 2014-06-15 MED ORDER — HYDROCODONE-HOMATROPINE 5-1.5 MG/5ML PO SYRP
5.0000 mL | ORAL_SOLUTION | Freq: Three times a day (TID) | ORAL | Status: DC | PRN
Start: 2014-06-15 — End: 2015-04-17

## 2014-06-15 NOTE — Progress Notes (Signed)
   Subjective:    Patient ID: Jordan [name], female    DOB: 03-24-1940, 74 y.o.   MRN: [id number]  HPI 74 y/o female presents for 1 week f/u of wheezing and evaluation for DVT. Doppler was negative for DVT and patients symptoms on LLE have much improved. She feels that she is breathing much better     Review of Systems  Constitutional: Negative.   HENT: Positive for rhinorrhea and sneezing.   Respiratory: Positive for cough (productive worse at night or when lying down ) and shortness of breath (chronic). Negative for wheezing.   Cardiovascular: Negative.   Neurological: Negative.   Psychiatric/Behavioral: Negative.        Objective:   Physical Exam  Constitutional: She is oriented to person, place, and time. She appears well-developed and well-nourished. No distress.  HENT:  Head: Normocephalic and atraumatic.  Cardiovascular: Normal rate, regular rhythm and normal heart sounds.  Exam reveals no gallop and no friction rub.   No murmur heard. Pulmonary/Chest: Effort normal. No respiratory distress. She has wheezes (RUL, RLL).  Neurological: She is alert and oriented to person, place, and time.  Skin: She is not diaphoretic.  Psychiatric: She has a normal mood and affect. Her behavior is normal.  Nursing note and vitals reviewed.         Assessment & Plan:  1. COPD exacerbation: much improved since treatment. Patient still has some continued expiratory wheezing on RUL and RLL but much improved. Advised her to continue SABA q 4 hours and LABA as prescribed.   2. Allergic Rhinitis: Continue Zyrtec 10mg . Add flonase as directed.   F/u if s/s worsen or do not improve.

## 2014-06-15 NOTE — Patient Instructions (Signed)
Fluticasone nasal spray (Flonase) What is this medicine? FLUTICASONE (floo TIK a sone) is a corticosteroid. This medicine is used to treat the symptoms of allergies like sneezing, itchy red eyes, and itchy, runny, or stuffy nose. This medicine may be used for other purposes; ask your health care provider or pharmacist if you have questions. COMMON BRAND NAME(S): Flonase, Flonase Allergy Relief, Veramyst What should I tell my health care provider before I take this medicine? They need to know if you have any of these conditions: -infection, like tuberculosis, herpes, or fungal infection -recent surgery on nose or sinuses -taking corticosteroid by mouth -an unusual or allergic reaction to fluticasone, steroids, other medicines, foods, dyes, or preservatives -pregnant or trying to get pregnant -breast-feeding How should I use this medicine? This medicine is for use in the nose. Follow the directions on your product or prescription label. This medicine works best if used at regular intervals. Do not use more often than directed. Make sure that you are using your nasal spray correctly. After 6 months of daily use without a prescription, talk to your doctor or health care professional before using it for a longer time. Ask your doctor or health care professional if you have any questions. Talk to your pediatrician regarding the use of this medicine in children. While this drug may be used for children as young as 37 years old for selected conditions, precautions do apply. After 2 months of daily use without a prescription in a child, talk to your pediatrician before using it for a longer time. Overdosage: If you think you have taken too much of this medicine contact a poison control center or emergency room at once. NOTE: This medicine is only for you. Do not share this medicine with others. What if I miss a dose? If you miss a dose, use it as soon as you remember. If it is almost time for your next  dose, use only that dose and continue with your regular schedule. Do not use double or extra doses. What may interact with this medicine? -ketoconazole -metyrapone -some medicines for HIV -vaccines This list may not describe all possible interactions. Give your health care provider a list of all the medicines, herbs, non-prescription drugs, or dietary supplements you use. Also tell them if you smoke, drink alcohol, or use illegal drugs. Some items may interact with your medicine. What should I watch for while using this medicine? Visit your doctor or health care professional for regular checks on your progress. Some symptoms may improve within 12 hours after starting use. Check with your doctor or health care professional if there is no improvement in your condition after 3 weeks of use. Do not come in contact with people who have chickenpox or the measles while you are taking this medicine. If you do, call your doctor right away. What side effects may I notice from receiving this medicine? Side effects that you should report to your doctor or health care professional as soon as possible: -allergic reactions like skin rash, itching or hives, swelling of the face, lips, or tongue -changes in vision -flu-like symptoms -white patches or sores in the mouth or nose Side effects that usually do not require medical attention (report to your doctor or health care professional if they continue or are bothersome): -burning or irritation inside the nose or throat -cough -headache -nosebleed -unusual taste or smell This list may not describe all possible side effects. Call your doctor for medical advice about side effects. You may  report side effects to FDA at 1-800-FDA-1088. Where should I keep my medicine? Keep out of the reach of children. Store at room temperature between 15 and 30 degrees C (59 and 86 degrees F). Throw away any unused medicine after the expiration date. NOTE: This sheet is a  summary. It may not cover all possible information. If you have questions about this medicine, talk to your doctor, pharmacist, or health care provider.  2015, Elsevier/Gold Standard. (2013-06-16 15:55:20)

## 2014-07-07 ENCOUNTER — Other Ambulatory Visit: Payer: Self-pay | Admitting: Nurse Practitioner

## 2014-07-10 ENCOUNTER — Other Ambulatory Visit: Payer: Self-pay | Admitting: Nurse Practitioner

## 2014-07-19 ENCOUNTER — Ambulatory Visit (INDEPENDENT_AMBULATORY_CARE_PROVIDER_SITE_OTHER): Payer: Medicare Other | Admitting: Internal Medicine

## 2014-07-19 ENCOUNTER — Encounter: Payer: Self-pay | Admitting: Internal Medicine

## 2014-07-19 VITALS — BP 128/78 | HR 74 | Ht 66.0 in | Wt 183.0 lb

## 2014-07-19 DIAGNOSIS — J302 Other seasonal allergic rhinitis: Secondary | ICD-10-CM | POA: Diagnosis not present

## 2014-07-19 DIAGNOSIS — J449 Chronic obstructive pulmonary disease, unspecified: Secondary | ICD-10-CM

## 2014-07-19 NOTE — Patient Instructions (Signed)
We don't need to do anything differently for now  Please call as needed

## 2014-07-19 NOTE — Progress Notes (Signed)
10/06/12- 74 yoF former smoler referred courtesy of Dr Laurance Flatten at [facility] for COPD.  Dr Morrie Sheldon; [place] Childhood asthma and allergy managed at [place] until resolved around age 74. Subsequently 3ppd smoker until quit in 2000. Treated episodically until hospitalized with acute COPD exacerbation Oct, 2013 and sent home with continuous O2 2L/ [facility]. Depends on nebulizer 4-5x/day. Moved here 3 weeks ago, into an older house, central air, no mold. Feeling more short of breath with ADLs and noting daily cough thick white phlegm. Feet swell at times. Gets sustained chest tight, and may note tachycardia after neb.No blood, fever, purulent or exertional chest pain.  Diabetic, hypertensive, but denies heart disease.  May have had pneumonia, never pneumovax.  10/22/12-  74 yoF former smoker referred courtesy of Dr Laurance Flatten at [facility] for COPD FOLLOWS [id number] much better since last visit!!! "Feels like a new person" Feeling much better. Still some shortness of breath in the shower. Has home oxygen from the hospital, using when necessary now at 2 L/ [facility]. She did not qualify for home oxygen during exertion with exercise test by Dr. Laurance Flatten. CXR 10/18/12 IMPRESSION:  No acute findings.  Original Report Authenticated By: Skipper Cliche, M.D.  11/19/12- 62 yoF former smoker referred courtesy of Dr Laurance Flatten at [facility] for COPD FOLLOWS FOR:  Breathing unchanged since last OV.  Having construction in home, so as of yesterday chest feeling heavy due the dust  Continues oxygen 2 L for sleep/ [place].  02/18/13- 74 yoF former smoker referred courtesy of Dr Laurance Flatten at [facility] for COPD FOLLOWS FOR: drainage at night causing a cough-feels like she is taking a cold Continues oxygen 2 L/[facility]. Breathing has been well-controlled with occasional mild wheeze. She only uses Dulera once or twice daily, keeping dose down to avoid shaking. Nebulizer twice  daily.  08/18/13- 74 yoF former smoker referred courtesy of Dr Laurance Flatten at [facility] for [place]: having to stay indoors more often-hot weather causing her to have trouble breathing. DME is [facility]-needs to have ONO repeated for M'care documentation and pt states she uses her O2 2L during the day as well only as needed. Little cough, some postnasal drip. Daily zyrtec.Using neb 1-2x daily and occ rescue inhaler. She says control is adequate.  01/19/14- 74 yoF former smoker referred courtesy of Dr Laurance Flatten at [facility] for COPD FOLLOW FOR: Asthma w/ COPD; breathing doing better, no complaints.  O2 2L/ [facility]. Has Z pak on hold. [place] 08/23/13- Qualified for sleep O2 2L CXR 08/18/13-  IMPRESSION: No active cardiopulmonary disease. Stable mild cardiomegaly and biapical pleural thickening. Electronically Signed  By: Ivar Drape M.D.  On: 08/18/2013 10:20  07/19/14- 74 yoF former smoker followed for Asthma with COPD breathing do well until 2 mths ago, pt got congestion, wheezing, SOB, and prod cough w/white mucus O2 2L/ [facility] for sleep Nasal and chest congestion 2 months, postnasal drip at night. See pack from primary physician 3 weeks ago did help. Residual cough with clear mucus, no fever. CXR 06/09/14-I reviewed the images with her FINDINGS: Mild hyperinflation without pneumonia or edema. No effusion or pneumothorax. There is chronic and symmetric biapical pleural thickening consistent with scarring. Normal heart size and aortic contours. Calcified breast implants noted. IMPRESSION: No active cardiopulmonary disease. Chronic hyperinflation. Electronically Signed  By: Monte Fantasia M.D.  On: 06/09/2014 13:49  ROS-see HPI Constitutional:   No-   weight loss, night sweats, fevers, chills, fatigue, lassitude. HEENT:  No- headaches, difficulty swallowing, tooth/dental problems, sore throat,       No- sneezing,no- itching, ear ache, +nasal congestion, +post nasal drip,  CV:  No-chest pain,  orthopnea, PND, no-swelling in lower extremities, anasarca, dizziness, +palpitations Resp: +shortness of breath with exertion or at rest.              No- productive cough,  + non-productive cough,  No- coughing up of blood.              No-   change in color of mucus.  + wheezing.   Skin: No-   rash or lesions. GI:  No-   heartburn, indigestion, abdominal pain, nausea, vomiting,  GU:  MS:  No-   joint pain or swelling.  . Neuro-  +tremor with sympathetic meds Psych:  No- change in mood or affect. +depression or anxiety.  No memory loss.  OBJ- Physical Exam  General- Alert, Oriented, Affect+mildly anxious, Distress- none acute,  Skin- rash-none, lesions- none, excoriation- none Lymphadenopathy- none Head- atraumatic            Eyes- Gross vision intact, PERRLA, conjunctivae and secretions clear            Ears- Hearing, canals-normal            Nose- Clear, no-Septal dev, mucus, polyps, erosion, perforation             Throat- Mallampati II , mucosa clear , drainage- none, tonsils- atrophic Neck- flexible , trachea midline, no stridor , thyroid nl, carotid no bruit Chest - symmetrical excursion , unlabored           Heart/CV- RRR , no murmur , no gallop  , no rub, nl s1 s2                           - JVD- none , edema-+trace, stasis changes- none, varices- none           Lung- +diminished/ unlabored room air/ clear, cough- none , dullness-none, rub- none           Chest wall-  Abd-  Br/ Gen/ Rectal- Not done, not indicated Extrem- cyanosis- none, clubbing, none, atrophy- none, strength- nl Neuro- grossly intact to observation

## 2014-07-25 ENCOUNTER — Other Ambulatory Visit: Payer: Self-pay | Admitting: Family Medicine

## 2014-08-04 ENCOUNTER — Other Ambulatory Visit: Payer: Self-pay | Admitting: Nurse Practitioner

## 2014-08-07 NOTE — Assessment & Plan Note (Signed)
Continues to need oxygen at night for sleep. Has largely resolved a recent bronchitis with some residual productive cough

## 2014-08-07 NOTE — Assessment & Plan Note (Signed)
We discussed nonallergic vasomotor versus allergic rhinitis. Spring pollen season is much over now. We will watch to see if there is a change in symptoms.

## 2014-08-12 ENCOUNTER — Other Ambulatory Visit: Payer: Self-pay | Admitting: Family Medicine

## 2014-10-25 ENCOUNTER — Other Ambulatory Visit: Payer: Self-pay | Admitting: Physician Assistant

## 2014-11-27 ENCOUNTER — Other Ambulatory Visit: Payer: Self-pay | Admitting: Family Medicine

## 2014-11-28 NOTE — Telephone Encounter (Signed)
Last seen 06/15/14  Tiffany  Requesting 90 day supply

## 2014-12-07 ENCOUNTER — Other Ambulatory Visit: Payer: Self-pay | Admitting: Nurse Practitioner

## 2014-12-15 ENCOUNTER — Telehealth: Payer: Self-pay | Admitting: *Deleted

## 2014-12-15 MED ORDER — METFORMIN HCL 500 MG PO TABS: 500.0000 mg | ORAL_TABLET | Freq: Two times a day (BID) | ORAL | Status: DC

## 2014-12-18 ENCOUNTER — Emergency Department (HOSPITAL_COMMUNITY)
Admission: EM | Admit: 2014-12-18 | Discharge: 2014-12-18 | Disposition: A | Discharge status: Home / Self Care | Payer: Medicare Other | Attending: Emergency Medicine | Admitting: Emergency Medicine

## 2014-12-18 ENCOUNTER — Encounter (HOSPITAL_COMMUNITY): Payer: Self-pay | Admitting: Emergency Medicine

## 2014-12-18 ENCOUNTER — Emergency Department (HOSPITAL_COMMUNITY): Payer: Medicare Other

## 2014-12-18 ENCOUNTER — Ambulatory Visit: Payer: Medicare Other | Admitting: Family Medicine

## 2014-12-18 DIAGNOSIS — E119 Type 2 diabetes mellitus without complications: Secondary | ICD-10-CM | POA: Diagnosis not present

## 2014-12-18 DIAGNOSIS — Z7982 Long term (current) use of aspirin: Secondary | ICD-10-CM | POA: Insufficient documentation

## 2014-12-18 DIAGNOSIS — I10 Essential (primary) hypertension: Secondary | ICD-10-CM | POA: Insufficient documentation

## 2014-12-18 DIAGNOSIS — Z87891 Personal history of nicotine dependence: Secondary | ICD-10-CM | POA: Diagnosis not present

## 2014-12-18 DIAGNOSIS — E785 Hyperlipidemia, unspecified: Secondary | ICD-10-CM | POA: Insufficient documentation

## 2014-12-18 DIAGNOSIS — H269 Unspecified cataract: Secondary | ICD-10-CM | POA: Diagnosis not present

## 2014-12-18 DIAGNOSIS — E079 Disorder of thyroid, unspecified: Secondary | ICD-10-CM | POA: Insufficient documentation

## 2014-12-18 DIAGNOSIS — J449 Chronic obstructive pulmonary disease, unspecified: Secondary | ICD-10-CM | POA: Diagnosis not present

## 2014-12-18 DIAGNOSIS — Z8673 Personal history of transient ischemic attack (TIA), and cerebral infarction without residual deficits: Secondary | ICD-10-CM | POA: Insufficient documentation

## 2014-12-18 DIAGNOSIS — R42 Dizziness and giddiness: Secondary | ICD-10-CM | POA: Insufficient documentation

## 2014-12-18 DIAGNOSIS — F419 Anxiety disorder, unspecified: Secondary | ICD-10-CM | POA: Insufficient documentation

## 2014-12-18 DIAGNOSIS — Z79899 Other long term (current) drug therapy: Secondary | ICD-10-CM | POA: Insufficient documentation

## 2014-12-18 DIAGNOSIS — Z880 Allergy status to penicillin: Secondary | ICD-10-CM | POA: Insufficient documentation

## 2014-12-18 DIAGNOSIS — F329 Major depressive disorder, single episode, unspecified: Secondary | ICD-10-CM | POA: Diagnosis not present

## 2014-12-18 DIAGNOSIS — R51 Headache: Secondary | ICD-10-CM | POA: Diagnosis not present

## 2014-12-18 HISTORY — DX: Cerebral infarction, unspecified: I63.9

## 2014-12-18 LAB — CBG MONITORING, ED: Glucose-Capillary: 149 mg/dL — ABNORMAL HIGH (ref 65–99)

## 2014-12-18 LAB — CBC
HEMATOCRIT: 45.5 % (ref 36.0–46.0)
HEMOGLOBIN: 14.3 g/dL (ref 12.0–15.0)
MCH: 27.2 pg (ref 26.0–34.0)
MCHC: 31.4 g/dL (ref 30.0–36.0)
MCV: 86.7 fL (ref 78.0–100.0)
Platelets: 725 10*3/uL — ABNORMAL HIGH (ref 150–400)
RBC: 5.25 MIL/uL — ABNORMAL HIGH (ref 3.87–5.11)
RDW: 16.9 % — AB (ref 11.5–15.5)
WBC: 9.4 10*3/uL (ref 4.0–10.5)

## 2014-12-18 LAB — URINALYSIS, ROUTINE W REFLEX MICROSCOPIC
BILIRUBIN URINE: NEGATIVE
Glucose, UA: NEGATIVE mg/dL
HGB URINE DIPSTICK: NEGATIVE
Ketones, ur: NEGATIVE mg/dL
Leukocytes, UA: NEGATIVE
Nitrite: NEGATIVE
PH: 7 (ref 5.0–8.0)
Protein, ur: NEGATIVE mg/dL
SPECIFIC GRAVITY, URINE: 1.014 (ref 1.005–1.030)
Urobilinogen, UA: 0.2 mg/dL (ref 0.0–1.0)

## 2014-12-18 LAB — BASIC METABOLIC PANEL
ANION GAP: 10 (ref 5–15)
BUN: 12 mg/dL (ref 6–20)
CHLORIDE: 100 mmol/L — AB (ref 101–111)
CO2: 29 mmol/L (ref 22–32)
Calcium: 9.3 mg/dL (ref 8.9–10.3)
Creatinine, Ser: 0.9 mg/dL (ref 0.44–1.00)
GFR calc Af Amer: >60 mL/min (ref >60)
GFR calc non Af Amer: >60 mL/min (ref >60)
GLUCOSE: 150 mg/dL — AB (ref 65–99)
POTASSIUM: 3.9 mmol/L (ref 3.5–5.1)
Sodium: 139 mmol/L (ref 135–145)

## 2014-12-18 LAB — I-STAT TROPONIN, ED
TROPONIN I, POC: 0.01 ng/mL (ref 0.00–0.08)
Troponin i, poc: 0 ng/mL (ref 0.00–0.08)

## 2014-12-18 MED ORDER — MECLIZINE HCL 12.5 MG PO TABS
12.5000 mg | ORAL_TABLET | Freq: Three times a day (TID) | ORAL | Status: DC | PRN
Start: 2014-12-18 — End: 2015-11-22

## 2014-12-18 NOTE — Discharge Instructions (Signed)

## 2014-12-18 NOTE — ED Notes (Signed)
CBG 149. 

## 2014-12-18 NOTE — ED Provider Notes (Signed)
CSN: [id number]     Arrival date & time 12/18/14  0919 History   First MD Initiated Contact with Patient 12/18/14 [phone]     Chief Complaint  Patient presents with  . Dizziness     (Consider location/radiation/quality/duration/timing/severity/associated sxs/prior Treatment) Patient is a 74 y.o. female presenting with dizziness.  Dizziness Quality:  Lightheadedness Severity:  Moderate Onset quality:  Gradual Duration:  8 hours Timing:  Constant Progression:  Unchanged Chronicity:  New Context comment:  Spontaneous Relieved by:  Nothing Worsened by:  Standing up Ineffective treatments:  None tried Associated symptoms: no blood in stool, no nausea, no syncope and no vomiting     Past Medical History  Diagnosis Date  . COPD (chronic obstructive pulmonary disease) ([place])   . Asthma   . Cataract   . Anxiety   . Depression   . Diabetes mellitus without complication ([place])   . Hyperlipidemia   . Hypertension   . Thyroid disease   . Cataracts, both eyes 05/2014  . Stroke [facility])    Past Surgical History  Procedure Laterality Date  . Abdominal hysterectomy    . Tonsillectomy    . Rotator cuff repair Right   . Ankle surgery Right   . Skin cancers    . Breast surgery      Bx, none malignant   Family History  Problem Relation Age of Onset  . Alzheimer's disease Mother   . Arthritis Father    Social History  Substance Use Topics  . Smoking status: Former Smoker    Types: Cigarettes    Quit date: 03/10/2000  . Smokeless tobacco: None  . Alcohol Use: No   OB History    No data available     Review of Systems  Cardiovascular: Negative for syncope.  Gastrointestinal: Negative for nausea, vomiting and blood in stool.  Neurological: Positive for dizziness.  All other systems reviewed and are negative.     Allergies  Bee venom; Codeine; Lipitor; Penicillins; Morphine and related; and Tetracyclines & related  Home Medications   Prior to Admission medications    Medication Sig Start Date End Date Taking? Authorizing Provider  albuterol (PROVENTIL HFA;VENTOLIN HFA) 108 (90 BASE) MCG/ACT inhaler Inhale 2 puffs into the lungs every 4 (four) hours as needed for wheezing or shortness of breath. 06/15/14   Tiffany A Gann, PA-C  ALPRAZolam Duanne Moron) 0.5 MG tablet 1-2 tablets twice a day as needed 01/17/14   Lysbeth Penner, FNP  aspirin 81 MG tablet Take 81 mg by mouth daily.    Historical Provider, MD  cetirizine (ZYRTEC) 10 MG tablet Take 10 mg by mouth daily.    Historical Provider, MD  cyanocobalamin (,VITAMIN B-12,) 1000 MCG/ML injection 1 ml IM daily for a week then weekly for 4 weeks then monthly 01/18/14   Lysbeth Penner, FNP  fluticasone [facility]) 50 MCG/ACT nasal spray Place 2 sprays into both nostrils daily. 06/15/14   Tiffany A Gann, PA-C  HYDROcodone-homatropine (HYCODAN) 5-1.5 MG/5ML syrup Take 5 mLs by mouth every 8 (eight) hours as needed for cough. 06/15/14   Tiffany A Gann, PA-C  ipratropium-albuterol (DUONEB) 0.5-2.5 (3) MG/3ML SOLN Take 3 mL by nebulization every 6 (six) hours as needed. 10/22/12   Deneise Lever, MD  levothyroxine (SYNTHROID, LEVOTHROID) 112 MCG tablet Take 1 tablet (112 mcg total) by mouth daily. 01/18/14   Lysbeth Penner, FNP  meclizine (ANTIVERT) 12.5 MG tablet Take 1 tablet (12.5 mg total) by mouth 3 (three) times daily as  needed for dizziness. 12/18/14   Debby Freiberg, MD  metFORMIN (GLUCOPHAGE) 500 MG tablet Take 1 tablet (500 mg total) by mouth 2 (two) times daily with a meal. 12/15/14   Wardell Honour, MD  metoprolol succinate (TOPROL-XL) 25 MG 24 hr tablet TAKE 1 TABLET (25 MG TOTAL) BY MOUTH DAILY. 11/29/14   Tiffany A Gann, PA-C  mometasone-formoterol (DULERA) 100-5 MCG/ACT AERO 2 puffs then rinse mouth, twice daily maintenance 10/22/12   Deneise Lever, MD  PARoxetine (PAXIL) 40 MG tablet Take 1 tablet (40 mg total) by mouth every morning. 01/17/14   Lysbeth Penner, FNP   BP 150/59 mmHg  Pulse 52  Temp(Src) 98  F (36.7 C) (Oral)  Resp 11  Ht 5\' 6"  (1.676 m)  Wt 183 lb (83.008 kg)  BMI 29.55 kg/m2  SpO2 94% Physical Exam  Constitutional: She is oriented to person, place, and time. She appears well-developed and well-nourished.  HENT:  Head: Normocephalic and atraumatic.  Right Ear: External ear normal.  Left Ear: External ear normal.  Eyes: Conjunctivae and EOM are normal. Pupils are equal, round, and reactive to light.  Neck: Normal range of motion. Neck supple.  Cardiovascular: Normal rate, regular rhythm, normal heart sounds and intact distal pulses.   Pulmonary/Chest: Effort normal and breath sounds normal.  Abdominal: Soft. Bowel sounds are normal. There is no tenderness.  Musculoskeletal: Normal range of motion.  Neurological: She is alert and oriented to person, place, and time. She has normal strength and normal reflexes. No cranial nerve deficit or sensory deficit. She displays a negative Romberg sign. GCS eye subscore is 4. GCS verbal subscore is 5. GCS motor subscore is 6.  Skin: Skin is warm and dry.  Vitals reviewed.   ED Course  Procedures (including critical care time) Labs Review Labs Reviewed  BASIC METABOLIC PANEL - Abnormal; Notable for the following:    Chloride 100 (*)    Glucose, Bld 150 (*)    All other components within normal limits  CBC - Abnormal; Notable for the following:    RBC 5.25 (*)    RDW 16.9 (*)    Platelets 725 (*)    All other components within normal limits  CBG MONITORING, ED - Abnormal; Notable for the following:    Glucose-Capillary 149 (*)    All other components within normal limits  URINALYSIS, ROUTINE W REFLEX MICROSCOPIC (NOT AT [facility])  Randolm Idol, ED  Randolm Idol, ED    Imaging Review Dg Chest 2 View  12/18/2014   CLINICAL DATA:  Headache and dizziness  EXAM: CHEST  2 VIEW  COMPARISON:  June 08, 2014  FINDINGS: There is mild atelectasis in the left base. There is no edema or consolidation. Heart is upper normal in  size with pulmonary vascularity within normal limits. No adenopathy. There is lower thoracic dextroscoliosis.  IMPRESSION: Mild left base atelectatic change.  No edema or consolidation.   Electronically Signed   By: Lowella Grip III M.D.   On: 12/18/2014 11:34   Ct Head Wo Contrast  12/18/2014   CLINICAL DATA:  Dizziness when waking at 3 a.m.  Frontal headaches.  EXAM: CT HEAD WITHOUT CONTRAST  TECHNIQUE: Contiguous axial images were obtained from the base of the skull through the vertex without intravenous contrast.  COMPARISON:  None.  FINDINGS: The brainstem, cerebellum, cerebral peduncles, thalamus, basal ganglia, basilar cisterns, and ventricular system appear within normal limits. No intracranial hemorrhage, mass lesion, or acute CVA.  There is spurring  at the anterior C1-2 articulation. The middle ears and mastoids appear clear.  IMPRESSION: 1. No significant intracranial abnormality is identified.   Electronically Signed   By: Van Clines M.D.   On: 12/18/2014 11:17   I have personally reviewed and evaluated these images and lab results as part of my medical decision-making.   EKG Interpretation   Date/Time:  Monday December 18 2014 15:94:58 EDT Ventricular Rate:  60 PR Interval:  135 QRS Duration: 84 QT Interval:  422 QTC Calculation: 422 R Axis:   -42 Text Interpretation:  Sinus rhythm Left axis deviation Nonspecific T  abnormalities, diffuse leads No old tracing to compare Confirmed by  Debby Freiberg [id number]) on 12/18/2014 9:40:32 AM      MDM   Final diagnoses:  Dizziness    74 y.o. female with pertinent PMH of COPD, anxiety, HTN, prior CVA without significant residual deficit presents with dizziness described primarily as near syncope.  Symptoms began sometime between 1 and 3 am.  No clear time of onset.  Physical exam on arrival benign, and pt without symptoms.  Wu as above unremarkable.  Still asymptomatic, including on ambulation.  DC home in stable condition  with strict return precautions for vertigo and near syncope    I have reviewed all laboratory and imaging studies if ordered as above  1. Dizziness         Debby Freiberg, MD 12/19/14 1258

## 2014-12-18 NOTE — ED Notes (Signed)
Patient states drifted off to sleep around 0100 and was normal.   Patient states she walks good with cane from old ankle fracture.   Patient states previous stroke, but no residual weakness.   Patient states was dizzy when she woke up around 0300 this morning.  Patient states frontal headache 3/10.   Patient states has not taken anything for headache today.   Patient states "it resembles sinus pain that makes my head sore".   Patient denies other symptoms.

## 2014-12-20 ENCOUNTER — Ambulatory Visit (INDEPENDENT_AMBULATORY_CARE_PROVIDER_SITE_OTHER): Payer: Medicare Other | Admitting: Family Medicine

## 2014-12-20 ENCOUNTER — Encounter: Payer: Self-pay | Admitting: Family Medicine

## 2014-12-20 DIAGNOSIS — R42 Dizziness and giddiness: Secondary | ICD-10-CM | POA: Diagnosis not present

## 2014-12-20 MED ORDER — ALPRAZOLAM 0.5 MG PO TABS: ORAL_TABLET | ORAL | Status: DC

## 2014-12-20 MED ORDER — METOPROLOL SUCCINATE ER 25 MG PO TB24: ORAL_TABLET | ORAL | Status: DC

## 2014-12-20 NOTE — Progress Notes (Signed)
BP 131/80 mmHg  Pulse 63  Temp(Src) 97.2 F (36.2 C) (Oral)  Ht 5\' 6"  (1.676 m)  Wt 183 lb 9.6 oz (83.28 kg)  BMI 29.65 kg/m2   Subjective:    Patient ID: Jordan [name], female    DOB: [birth year], 74 y.o.   MRN: [id number]  HPI: Jordan [name] is a 74 y.o. female presenting on 12/20/2014 for ER Followup, dizziness   HPI Dizziness Patient has been having dizziness and spinning sensation for the past 2 days. She awoke in the morning 2 days ago and when she sat up she felt like the room was spinning and that she was going to go down. She had to lay back down and that improved it but again when she turned her head or sat up she felt like her room was going side-to-side or spinning. She did have a little bit of nausea first but none since. She went to the emergency department on 12/18/2014 and was given meclizine for her dizziness. This has improved her symptoms some but not completely. She also feels like when she walks because of the way the room spinning she kind of leans towards 1 direction slightly. She is never had this before. She does admit to about a week and a half ago she had an upper respiratory cold that is now gone. She denies any pain or pressure in her ears, denies runny nose, sore throat, cough, shortness of breath, chest pain, or palpitations.  Relevant past medical, surgical, family and social history reviewed and updated as indicated. Interim medical history since our last visit reviewed. Allergies and medications reviewed and updated.  Review of Systems  Constitutional: Negative for fever and chills.  HENT: Negative for congestion, ear discharge and ear pain.   Eyes: Negative for redness and visual disturbance.  Respiratory: Negative for chest tightness and shortness of breath.   Cardiovascular: Negative for chest pain and leg swelling.  Genitourinary: Negative for dysuria and difficulty urinating.  Musculoskeletal: Positive for gait problem (because of balance and  spending). Negative for back pain.  Skin: Negative for rash.  Neurological: Positive for dizziness. Negative for tremors, speech difficulty, weakness, light-headedness, numbness and headaches.  Psychiatric/Behavioral: Negative for behavioral problems and agitation.  All other systems reviewed and are negative.   Per HPI unless specifically indicated above     Medication List       This list is accurate as of: 12/20/14  1:14 PM.  Always use your most recent med list.               albuterol 108 (90 BASE) MCG/ACT inhaler  Commonly known as:  PROVENTIL HFA;VENTOLIN HFA  Inhale 2 puffs into the lungs every 4 (four) hours as needed for wheezing or shortness of breath.     ALPRAZolam 0.5 MG tablet  Commonly known as:  XANAX  1-2 tablets twice a day as needed     aspirin 81 MG tablet  Take 81 mg by mouth daily.     cetirizine 10 MG tablet  Commonly known as:  ZYRTEC  Take 10 mg by mouth daily. As needed     cyanocobalamin 1000 MCG/ML injection  Commonly known as:  (VITAMIN B-12)  1 ml IM daily for a week then weekly for 4 weeks then monthly     fluticasone 50 MCG/ACT nasal spray  Commonly known as:  FLONASE  Place 2 sprays into both nostrils daily.     HYDROcodone-homatropine 5-1.5 MG/5ML syrup  Commonly known as:  HYCODAN  Take 5 mLs by mouth every 8 (eight) hours as needed for cough.     ipratropium-albuterol 0.5-2.5 (3) MG/3ML Soln  Commonly known as:  DUONEB  Take 3 mL by nebulization every 6 (six) hours as needed.     levothyroxine 112 MCG tablet  Commonly known as:  SYNTHROID, LEVOTHROID  Take 1 tablet (112 mcg total) by mouth daily.     meclizine 12.5 MG tablet  Commonly known as:  ANTIVERT  Take 1 tablet (12.5 mg total) by mouth 3 (three) times daily as needed for dizziness.     metFORMIN 500 MG tablet  Commonly known as:  GLUCOPHAGE  Take 1 tablet (500 mg total) by mouth 2 (two) times daily with a meal.     metoprolol succinate 25 MG 24 hr tablet    Commonly known as:  TOPROL-XL  TAKE 1 TABLET (25 MG TOTAL) BY MOUTH DAILY.     mometasone-formoterol 100-5 MCG/ACT Aero  Commonly known as:  DULERA  2 puffs then rinse mouth, twice daily maintenance     PARoxetine 40 MG tablet  Commonly known as:  PAXIL  Take 1 tablet (40 mg total) by mouth every morning.           Objective:    BP 131/80 mmHg  Pulse 63  Temp(Src) 97.2 F (36.2 C) (Oral)  Ht 5\' 6"  (1.676 m)  Wt 183 lb 9.6 oz (83.28 kg)  BMI 29.65 kg/m2  Wt Readings from Last 3 Encounters:  12/20/14 183 lb 9.6 oz (83.28 kg)  12/18/14 183 lb (83.008 kg)  07/19/14 183 lb (83.008 kg)    Physical Exam  Constitutional: She is oriented to person, place, and time. She appears well-developed and well-nourished. No distress.  Eyes: Conjunctivae and EOM are normal. Pupils are equal, round, and reactive to light. Right eye exhibits no discharge. Left eye exhibits no discharge. No scleral icterus.  Cardiovascular: Normal rate, regular rhythm, normal heart sounds and intact distal pulses.   No murmur heard. Pulmonary/Chest: Effort normal and breath sounds normal. No respiratory distress. She has no wheezes.  Musculoskeletal: Normal range of motion. She exhibits no edema or tenderness.  Neurological: She is alert and oriented to person, place, and time. She has normal strength. No cranial nerve deficit or sensory deficit. Gait (slow gait, but otherwise normal) abnormal. Coordination normal.  No nystagmus noted  Skin: Skin is warm and dry. No rash noted. She is not diaphoretic.  Psychiatric: She has a normal mood and affect. Her behavior is normal.  Vitals reviewed.   Results for orders placed or performed during the hospital encounter of 13/24/40  Basic metabolic panel  Result Value Ref Range   Sodium 139 135 - 145 mmol/L   Potassium 3.9 3.5 - 5.1 mmol/L   Chloride 100 (L) 101 - 111 mmol/L   CO2 29 22 - 32 mmol/L   Glucose, Bld 150 (H) 65 - 99 mg/dL   BUN 12 6 - 20 mg/dL    Creatinine, Ser 0.90 0.44 - 1.00 mg/dL   Calcium 9.3 8.9 - 10.3 mg/dL   GFR calc non Af Amer >60 >60 mL/min   GFR calc Af Amer >60 >60 mL/min   Anion gap 10 5 - 15  CBC  Result Value Ref Range   WBC 9.4 4.0 - 10.5 K/uL   RBC 5.25 (H) 3.87 - 5.11 MIL/uL   Hemoglobin 14.3 12.0 - 15.0 g/dL   HCT 45.5 36.0 - 46.0 %   MCV 86.7  78.0 - 100.0 fL   MCH 27.2 26.0 - 34.0 pg   MCHC 31.4 30.0 - 36.0 g/dL   RDW 16.9 (H) 11.5 - 15.5 %   Platelets 725 (H) 150 - 400 K/uL  Urinalysis, Routine w reflex microscopic (not at [facility])  Result Value Ref Range   Color, Urine YELLOW YELLOW   APPearance CLEAR CLEAR   Specific Gravity, Urine 1.014 1.005 - 1.030   pH 7.0 5.0 - 8.0   Glucose, UA NEGATIVE NEGATIVE mg/dL   Hgb urine dipstick NEGATIVE NEGATIVE   Bilirubin Urine NEGATIVE NEGATIVE   Ketones, ur NEGATIVE NEGATIVE mg/dL   Protein, ur NEGATIVE NEGATIVE mg/dL   Urobilinogen, UA 0.2 0.0 - 1.0 mg/dL   Nitrite NEGATIVE NEGATIVE   Leukocytes, UA NEGATIVE NEGATIVE  CBG monitoring, ED  Result Value Ref Range   Glucose-Capillary 149 (H) 65 - 99 mg/dL  I-stat troponin, ED  Result Value Ref Range   Troponin i, poc 0.00 0.00 - 0.08 ng/mL   Comment 3          I-stat troponin, ED  Result Value Ref Range   Troponin i, poc 0.01 0.00 - 0.08 ng/mL   Comment 3              Assessment & Plan:       Problem List Items Addressed This Visit    None    Visit Diagnoses    Vertigo        Possible labyrinthitis versus vertigo, was given meclizine, recommended to also take an anti-inflammatory        Follow up plan: Return in about 4 weeks (around 01/17/2015), or if symptoms worsen or fail to improve, for depression and anxiety and blood pressure.  Caryl Pina, MD [facility] 12/20/2014, 1:14 PM

## 2014-12-26 ENCOUNTER — Ambulatory Visit: Payer: Medicare Other | Admitting: Family Medicine

## 2014-12-28 ENCOUNTER — Telehealth: Payer: Self-pay | Admitting: Family Medicine

## 2015-01-16 ENCOUNTER — Other Ambulatory Visit: Payer: Self-pay | Admitting: *Deleted

## 2015-01-16 MED ORDER — LEVOTHYROXINE SODIUM 112 MCG PO TABS: 112.0000 ug | ORAL_TABLET | Freq: Every day | ORAL | Status: DC

## 2015-01-17 NOTE — Telephone Encounter (Signed)
Med sent to pharmacy.

## 2015-01-19 ENCOUNTER — Ambulatory Visit: Payer: Medicare Other | Admitting: Internal Medicine

## 2015-01-25 ENCOUNTER — Other Ambulatory Visit: Payer: Self-pay | Admitting: Family Medicine

## 2015-02-05 ENCOUNTER — Telehealth: Payer: Self-pay | Admitting: Family Medicine

## 2015-02-07 ENCOUNTER — Ambulatory Visit (INDEPENDENT_AMBULATORY_CARE_PROVIDER_SITE_OTHER): Payer: Medicare Other

## 2015-02-07 DIAGNOSIS — Z23 Encounter for immunization: Secondary | ICD-10-CM

## 2015-02-15 ENCOUNTER — Other Ambulatory Visit: Payer: Self-pay | Admitting: Family Medicine

## 2015-02-16 NOTE — Telephone Encounter (Signed)
Last seen 12/20/14 Dr Dettinger  If approved route to nurse to call into [facility]

## 2015-02-16 NOTE — Telephone Encounter (Signed)
She needs to be seen to discuss depression specifically.

## 2015-02-21 ENCOUNTER — Other Ambulatory Visit: Payer: Self-pay | Admitting: Physician Assistant

## 2015-02-21 IMAGING — CR DG CHEST 2V
2 series · 2 of 2 positions shown · non-contrast
Comparison: Chest x-ray of 10/06/2012

CLINICAL DATA: COPD, asthma, former smoking history

EXAM:
CHEST  2 VIEW

[view not recorded (1 of 2)]
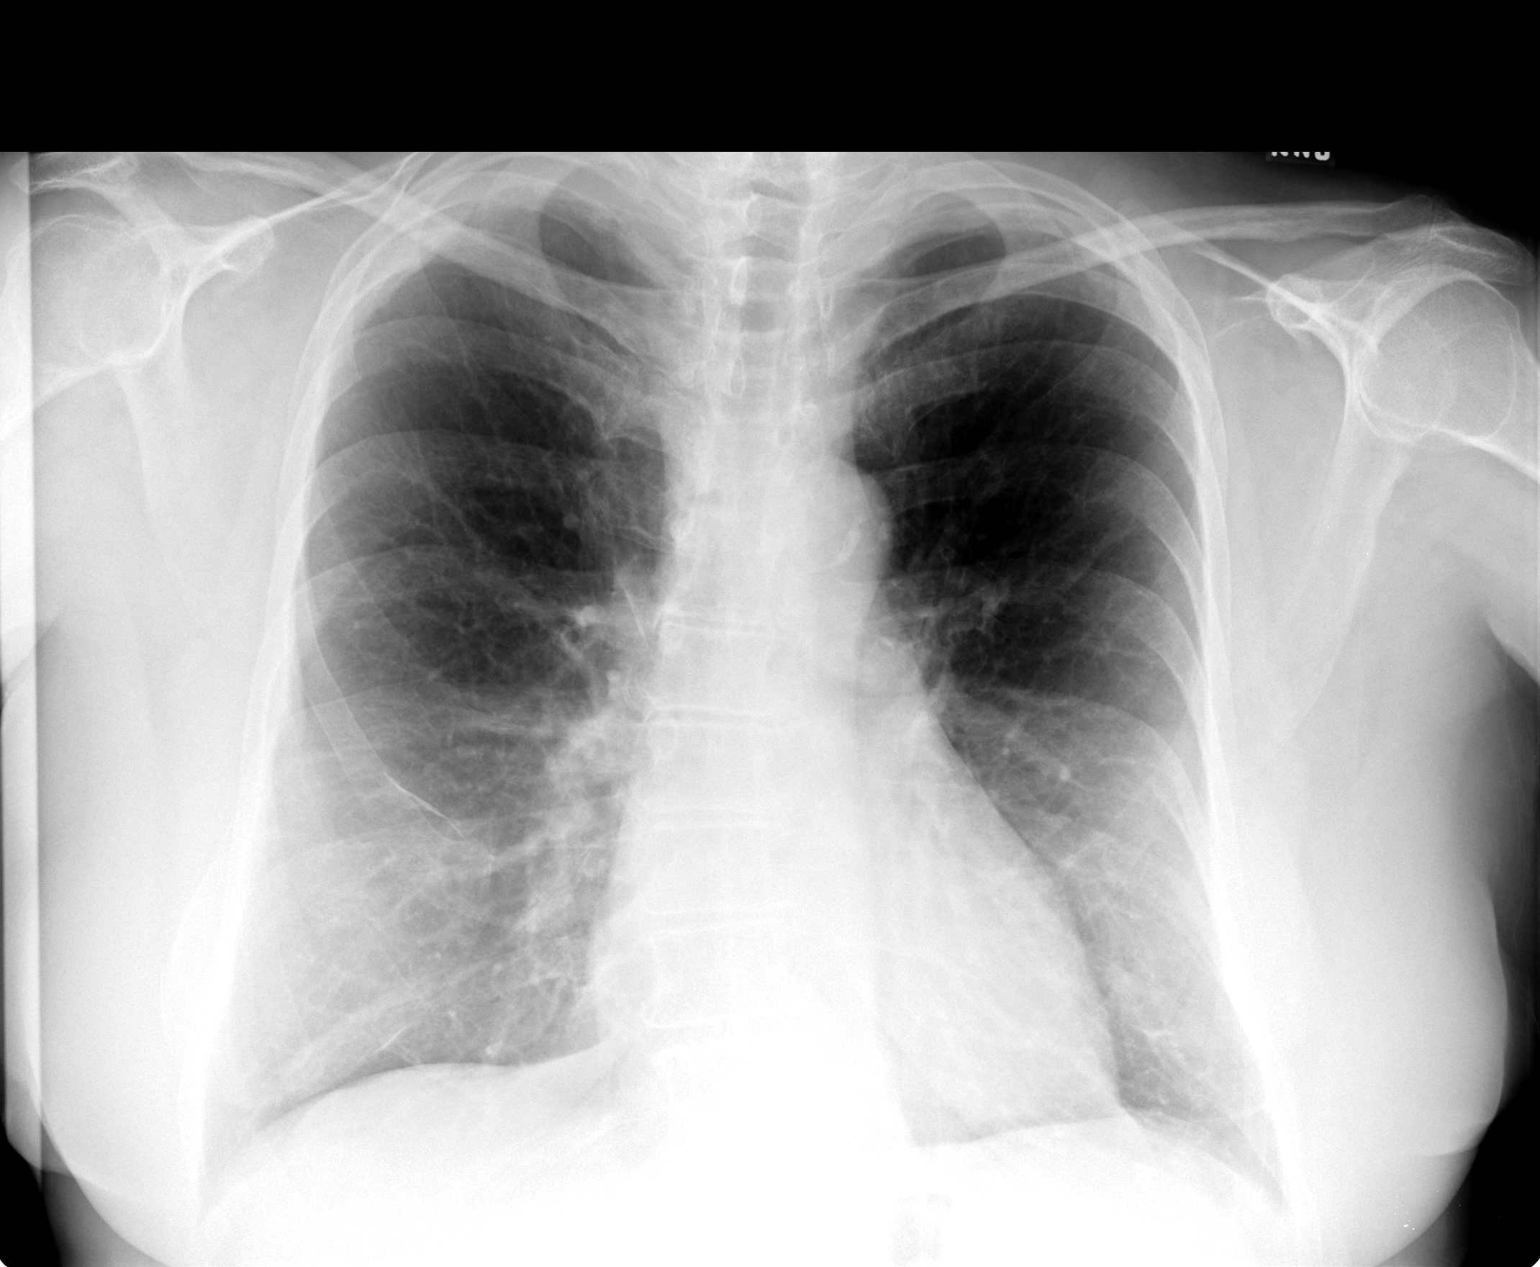

[view not recorded (2 of 2)]
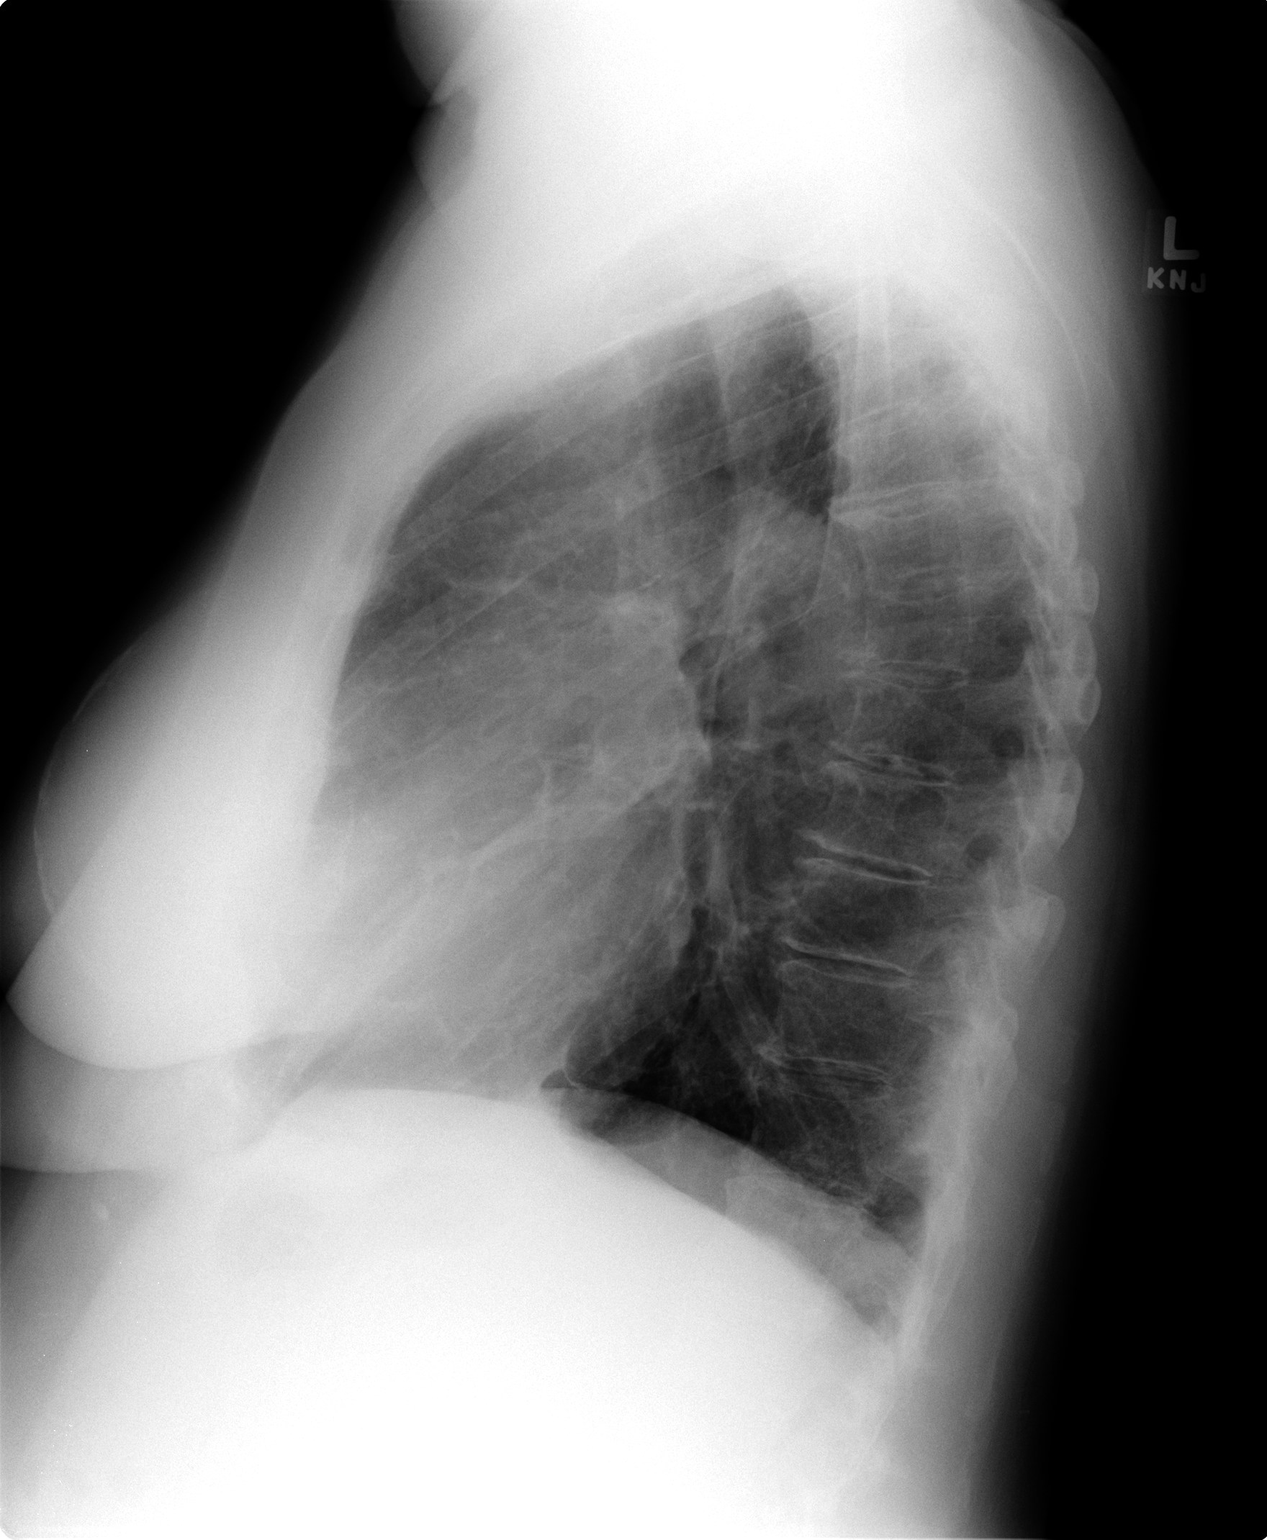

[2 of 2 positions shown; findings below may reference images not displayed]

FINDINGS: No active infiltrate or effusion is seen. Apical pleural thickening
right greater than left appears stable. Mediastinal and hilar
contours are unchanged, and the heart is mildly enlarged and stable.
There are degenerative changes diffusely throughout the thoracic
spine.
IMPRESSION: No active cardiopulmonary disease. Stable mild cardiomegaly and
biapical pleural thickening.

## 2015-02-23 ENCOUNTER — Other Ambulatory Visit: Payer: Self-pay | Admitting: Physician Assistant

## 2015-02-27 ENCOUNTER — Other Ambulatory Visit: Payer: Self-pay | Admitting: Family Medicine

## 2015-02-27 NOTE — Telephone Encounter (Signed)
Pt notified she will need appt Pt was angry that she would need appt for refill Tried to explain to pt that this is a controlled med and that appt is necessary for refill She declined appt

## 2015-02-27 NOTE — Telephone Encounter (Signed)
PT will need to be seen. I have never seen patient

## 2015-03-18 ENCOUNTER — Other Ambulatory Visit: Payer: Self-pay | Admitting: Family Medicine

## 2015-04-17 ENCOUNTER — Encounter: Payer: Self-pay | Admitting: Family Medicine

## 2015-04-17 ENCOUNTER — Ambulatory Visit (INDEPENDENT_AMBULATORY_CARE_PROVIDER_SITE_OTHER): Payer: Medicare Other | Admitting: Family Medicine

## 2015-04-17 ENCOUNTER — Other Ambulatory Visit: Payer: Self-pay | Admitting: Family Medicine

## 2015-04-17 VITALS — BP 123/56 | HR 68 | Temp 97.1°F | Ht 66.0 in | Wt 187.0 lb

## 2015-04-17 DIAGNOSIS — J449 Chronic obstructive pulmonary disease, unspecified: Secondary | ICD-10-CM | POA: Diagnosis not present

## 2015-04-17 DIAGNOSIS — E079 Disorder of thyroid, unspecified: Secondary | ICD-10-CM | POA: Diagnosis not present

## 2015-04-17 DIAGNOSIS — I10 Essential (primary) hypertension: Secondary | ICD-10-CM | POA: Diagnosis not present

## 2015-04-17 DIAGNOSIS — E538 Deficiency of other specified B group vitamins: Secondary | ICD-10-CM

## 2015-04-17 DIAGNOSIS — E119 Type 2 diabetes mellitus without complications: Secondary | ICD-10-CM | POA: Diagnosis not present

## 2015-04-17 DIAGNOSIS — E785 Hyperlipidemia, unspecified: Secondary | ICD-10-CM | POA: Diagnosis not present

## 2015-04-17 LAB — POCT GLYCOSYLATED HEMOGLOBIN (HGB A1C): HEMOGLOBIN A1C: 6

## 2015-04-17 MED ORDER — ALPRAZOLAM 0.5 MG PO TABS: ORAL_TABLET | ORAL | Status: DC

## 2015-04-17 MED ORDER — ALBUTEROL SULFATE HFA 108 (90 BASE) MCG/ACT IN AERS: 2.0000 | INHALATION_SPRAY | RESPIRATORY_TRACT | Status: DC | PRN

## 2015-04-17 MED ORDER — ESCITALOPRAM OXALATE 20 MG PO TABS: 20.0000 mg | ORAL_TABLET | Freq: Every day | ORAL | Status: DC

## 2015-04-17 MED ORDER — HYDROCODONE-HOMATROPINE 5-1.5 MG/5ML PO SYRP: 5.0000 mL | ORAL_SOLUTION | Freq: Two times a day (BID) | ORAL | Status: DC | PRN

## 2015-04-17 NOTE — Progress Notes (Signed)
Subjective:    Patient ID: Jordan [name], female    DOB: 1941-02-03, 75 y.o.   MRN: [id number]  HPI Pt here for follow up and management of chronic medical problems which includes diabetes, hyperlipidemia, and thyroid disease. She is taking medications regularly. For many years. It is difficult to say whether the staying in bed is related to shortness of breath or general apathy which may be related to depression, but there is also a very strong family history of Alzheimer's disease and this could be early Alzheimer's. She also complains of some night sweats and hot flashes that really sounds hormonal. We talked about Solu replacement. She also complains of general lack of energy. She felt that B-12 shots helped but also comments that they are of psychological benefit as much as anything. Patient has not been seen in some time. Regarding her diabetes she takes metformin. She does not check her sugars. Her last A1c done 1-1/2 years ago was 5.8. She is limited by her shortness of breath but she stays in bed most of the day. She has been on Paxil      Patient Active Problem List   Diagnosis Date Noted  . Anxiety   . Depression   . Diabetes mellitus without complication ([place])   . Hyperlipidemia   . Hypertension   . Thyroid disease   . Seasonal allergic rhinitis 08/18/2013  . Asthma with COPD ([place]) 10/06/2012   Outpatient Encounter Prescriptions as of 04/17/2015  Medication Sig  . albuterol (PROVENTIL HFA;VENTOLIN HFA) 108 (90 BASE) MCG/ACT inhaler Inhale 2 puffs into the lungs every 4 (four) hours as needed for wheezing or shortness of breath.  . ALPRAZolam (XANAX) 0.5 MG tablet 1-2 tablets twice a day as needed  . aspirin 81 MG tablet Take 81 mg by mouth daily.  . cetirizine (ZYRTEC) 10 MG tablet Take 10 mg by mouth daily. As needed  . cyanocobalamin (,VITAMIN B-12,) 1000 MCG/ML injection 1 ml IM daily for a week then weekly for 4 weeks then monthly  . fluticasone (FLONASE) 50 MCG/ACT  nasal spray Place 2 sprays into both nostrils daily.  Marland Kitchen ipratropium-albuterol (DUONEB) 0.5-2.5 (3) MG/3ML SOLN Take 3 mL by nebulization every 6 (six) hours as needed.  Marland Kitchen levothyroxine (SYNTHROID, LEVOTHROID) 112 MCG tablet Take 1 tablet (112 mcg total) by mouth daily.  . meclizine (ANTIVERT) 12.5 MG tablet Take 1 tablet (12.5 mg total) by mouth 3 (three) times daily as needed for dizziness.  . metFORMIN (GLUCOPHAGE) 500 MG tablet Take 1 tablet (500 mg total) by mouth 2 (two) times daily with a meal.  . metoprolol succinate (TOPROL-XL) 25 MG 24 hr tablet TAKE 1 TABLET (25 MG TOTAL) BY MOUTH DAILY.  . mometasone-formoterol (DULERA) 100-5 MCG/ACT AERO 2 puffs then rinse mouth, twice daily maintenance  . PARoxetine (PAXIL) 40 MG tablet TAKE 1 TABLET (40 MG TOTAL) BY MOUTH EVERY MORNING.  . [DISCONTINUED] ALPRAZolam (XANAX) 0.5 MG tablet 1-2 tablets twice a day as needed  . [DISCONTINUED] metFORMIN (GLUCOPHAGE) 500 MG tablet TAKE 1 TABLET (500 MG TOTAL) BY MOUTH 2 (TWO) TIMES DAILY WITH A MEAL.  . [DISCONTINUED] metoprolol succinate (TOPROL-XL) 25 MG 24 hr tablet TAKE 1 TABLET (25 MG TOTAL) BY MOUTH DAILY.  Marland Kitchen HYDROcodone-homatropine (HYCODAN) 5-1.5 MG/5ML syrup Take 5 mLs by mouth every 12 (twelve) hours as needed for cough.  . [DISCONTINUED] HYDROcodone-homatropine (HYCODAN) 5-1.5 MG/5ML syrup Take 5 mLs by mouth every 8 (eight) hours as needed for cough. (Patient not taking: Reported on  04/17/2015)   No facility-administered encounter medications on file as of 04/17/2015.     Review of Systems  Constitutional: Negative.   HENT: Negative.   Eyes: Negative.   Respiratory: Positive for shortness of breath.   Cardiovascular: Negative.   Gastrointestinal: Negative.   Endocrine: Negative.        Night sweats   Genitourinary: Negative.   Musculoskeletal: Negative.   Skin: Negative.   Allergic/Immunologic: Negative.   Neurological: Positive for dizziness (episode of vertigo).  Hematological:  Negative.   Psychiatric/Behavioral: Negative.        Objective:   Physical Exam  Constitutional: She is oriented to person, place, and time. She appears well-developed and well-nourished.  HENT:  Head: Normocephalic.  Cardiovascular: Normal rate, regular rhythm and normal heart sounds.   Pulmonary/Chest: Effort normal. She has wheezes.  Neurological: She is alert and oriented to person, place, and time.   BP 123/56 mmHg  Pulse 68  Temp(Src) 97.1 F (36.2 C) (Oral)  Ht 5' 6"  (1.676 m)  Wt 187 lb (84.823 kg)  BMI 30.20 kg/m2  SpO2 93%        Assessment & Plan:  1. Thyroid disease She takes thyroid supplement but TSH has not been checked in over one year.. It is possible that this dose seems to be adjusted to help her energy. Will discontinue Paxil in favor of Lexapro. If her apathy is related to depression this might make a difference. - Thyroid Panel With TSH  2. Hyperlipidemia Last LDL in 2014 was 184. She is not on any statin or other lipid lowering medicine - Lipid panel  3. Essential hypertension Blood pressure is well-controlled controlled with metoprolol. She is not on an ACE inhibitor. - CMP14+EGFR  4. Diabetes mellitus without complication (HCC) Last J2Q was great at 5.8 but that was over a year ago - Microalbumin / creatinine urine ratio - POCT glycosylated hemoglobin (Hb A1C)  5. Chronic obstructive pulmonary disease, unspecified COPD type ([place]) She uses a combination of Atrovent and albuterol in her nebulizer but still has to use  when necessary albuterol frequently. O2 sat this morning is 93% on room air  6. Vitamin B 12 deficiency Will check B12 level today and a flow some injections may help. - Vitamin B12

## 2015-04-17 NOTE — Patient Instructions (Signed)
Medicare Annual Wellness Visit  [facility] and the medical providers at [facility] strive to bring you the best medical care.  In doing so we not only want to address your current medical conditions and concerns but also to detect new conditions early and prevent illness, disease and health-related problems.    Medicare offers a yearly Wellness Visit which allows our clinical staff to assess your need for preventative services including immunizations, lifestyle education, counseling to decrease risk of preventable diseases and screening for fall risk and other medical concerns.    This visit is provided free of charge (no copay) for all Medicare recipients. The clinical pharmacists at [facility] have begun to conduct these Wellness Visits which will also include a thorough review of all your medications.    As you primary medical provider recommend that you make an appointment for your Annual Wellness Visit if you have not done so already this year.  You may set up this appointment before you leave today or you may call back [phone] and schedule an appointment.  Please make sure when you call that you mention that you are scheduling your Annual Wellness Visit with the clinical pharmacist so that the appointment may be made for the proper length of time.     PLAN:.pi

## 2015-04-18 ENCOUNTER — Other Ambulatory Visit: Payer: Self-pay | Admitting: *Deleted

## 2015-04-18 DIAGNOSIS — E875 Hyperkalemia: Secondary | ICD-10-CM

## 2015-04-18 LAB — LIPID PANEL
CHOLESTEROL TOTAL: 243 mg/dL — AB (ref 100–199)
Chol/HDL Ratio: 6.2 ratio units — ABNORMAL HIGH (ref 0.0–4.4)
HDL: 39 mg/dL — AB (ref >39)
LDL Calculated: 159 mg/dL — ABNORMAL HIGH (ref 0–99)
Triglycerides: 225 mg/dL — ABNORMAL HIGH (ref 0–149)
VLDL CHOLESTEROL CAL: 45 mg/dL — AB (ref 5–40)

## 2015-04-18 LAB — CMP14+EGFR
ALK PHOS: 74 IU/L (ref 39–117)
ALT: 13 IU/L (ref 0–32)
AST: 15 IU/L (ref 0–40)
Albumin/Globulin Ratio: 1.9 (ref 1.1–2.5)
Albumin: 4.5 g/dL (ref 3.5–4.8)
BILIRUBIN TOTAL: 0.6 mg/dL (ref 0.0–1.2)
BUN/Creatinine Ratio: 13 (ref 11–26)
BUN: 12 mg/dL (ref 8–27)
CHLORIDE: 99 mmol/L (ref 96–106)
CO2: 27 mmol/L (ref 18–29)
CREATININE: 0.91 mg/dL (ref 0.57–1.00)
Calcium: 9.9 mg/dL (ref 8.7–10.3)
GFR calc Af Amer: 71 mL/min/{1.73_m2} (ref >59)
GFR calc non Af Amer: 62 mL/min/{1.73_m2} (ref >59)
GLUCOSE: 97 mg/dL (ref 65–99)
Globulin, Total: 2.4 g/dL (ref 1.5–4.5)
Potassium: 5.9 mmol/L (ref 3.5–5.2)
Sodium: 142 mmol/L (ref 134–144)
TOTAL PROTEIN: 6.9 g/dL (ref 6.0–8.5)

## 2015-04-18 LAB — THYROID PANEL WITH TSH
Free Thyroxine Index: 3.2 (ref 1.2–4.9)
T3 Uptake Ratio: 27 % (ref 24–39)
T4, Total: 11.9 ug/dL (ref 4.5–12.0)
TSH: 1.01 u[IU]/mL (ref 0.450–4.500)

## 2015-04-18 LAB — MICROALBUMIN / CREATININE URINE RATIO
Creatinine, Urine: 144.6 mg/dL
MICROALB/CREAT RATIO: 14.7 mg/g{creat} (ref 0.0–30.0)
Microalbumin, Urine: 21.3 ug/mL

## 2015-04-18 LAB — VITAMIN B12: VITAMIN B 12: 354 pg/mL (ref 211–946)

## 2015-04-19 ENCOUNTER — Other Ambulatory Visit: Payer: Medicare Other

## 2015-04-19 DIAGNOSIS — E875 Hyperkalemia: Secondary | ICD-10-CM | POA: Diagnosis not present

## 2015-04-20 ENCOUNTER — Other Ambulatory Visit: Payer: Self-pay | Admitting: *Deleted

## 2015-04-20 ENCOUNTER — Other Ambulatory Visit: Payer: Medicare Other

## 2015-04-20 DIAGNOSIS — E875 Hyperkalemia: Secondary | ICD-10-CM | POA: Diagnosis not present

## 2015-04-20 LAB — POTASSIUM: POTASSIUM: 6.7 mmol/L — AB (ref 3.5–5.2)

## 2015-04-21 LAB — BMP8+EGFR
BUN/Creatinine Ratio: 12 (ref 11–26)
BUN: 9 mg/dL (ref 8–27)
CHLORIDE: 98 mmol/L (ref 96–106)
CO2: 25 mmol/L (ref 18–29)
CREATININE: 0.73 mg/dL (ref 0.57–1.00)
Calcium: 9.2 mg/dL (ref 8.7–10.3)
GFR calc Af Amer: 93 mL/min/{1.73_m2} (ref >59)
GFR calc non Af Amer: 81 mL/min/{1.73_m2} (ref >59)
GLUCOSE: 119 mg/dL — AB (ref 65–99)
Potassium: 4.8 mmol/L (ref 3.5–5.2)
SODIUM: 141 mmol/L (ref 134–144)

## 2015-04-24 ENCOUNTER — Encounter: Payer: Self-pay | Admitting: Family Medicine

## 2015-04-24 ENCOUNTER — Ambulatory Visit (INDEPENDENT_AMBULATORY_CARE_PROVIDER_SITE_OTHER): Payer: Medicare Other | Admitting: Family Medicine

## 2015-04-24 VITALS — BP 133/72 | HR 72 | Temp 97.0°F | Ht 66.0 in | Wt 186.0 lb

## 2015-04-24 DIAGNOSIS — F32A Depression, unspecified: Secondary | ICD-10-CM

## 2015-04-24 DIAGNOSIS — J449 Chronic obstructive pulmonary disease, unspecified: Secondary | ICD-10-CM | POA: Diagnosis not present

## 2015-04-24 DIAGNOSIS — F329 Major depressive disorder, single episode, unspecified: Secondary | ICD-10-CM | POA: Diagnosis not present

## 2015-04-24 DIAGNOSIS — J45909 Unspecified asthma, uncomplicated: Secondary | ICD-10-CM | POA: Diagnosis not present

## 2015-04-24 DIAGNOSIS — I10 Essential (primary) hypertension: Secondary | ICD-10-CM | POA: Diagnosis not present

## 2015-04-24 NOTE — Progress Notes (Signed)
Subjective:    Patient ID: Jordan [name], female    DOB: 1941/01/01, 75 y.o.   MRN: [id number]  HPI 75 year old female who has had elevated potassium since her visit here last week. Initial potassium was 5.9, repeat was 6.7, and finally repeated and was normal at 4.8. All a while, her renal function putting GFR BUN/creatinine are normal. This implies with normal renal function that her kidneys can handle potassium. Over the past 2 years she has had some elevated potassiums or come back to normal when repeated. I think the problem was with capillary fragility her and/or lab error on and not pursue this any further. I have explained this to her. At her last visit, Paxil was discontinued and she was started on Lexapro. She takes 10 mg and that seems to calm her and make her want to get out of bed more. That is the goal although I do not think we have seen its antidepressant effect. The issue here is trying to differentiate apathy of dementia from depression as a cause of her staying in bed.  Patient Active Problem List   Diagnosis Date Noted  . Anxiety   . Depression   . Diabetes mellitus without complication ([place])   . Hyperlipidemia   . Hypertension   . Thyroid disease   . Seasonal allergic rhinitis 08/18/2013  . Asthma with COPD ([place]) 10/06/2012   Outpatient Encounter Prescriptions as of 04/24/2015  Medication Sig  . albuterol (PROVENTIL HFA;VENTOLIN HFA) 108 (90 Base) MCG/ACT inhaler Inhale 2 puffs into the lungs every 4 (four) hours as needed for wheezing or shortness of breath.  . ALPRAZolam (XANAX) 0.5 MG tablet 1-2 tablets twice a day as needed  . aspirin 81 MG tablet Take 81 mg by mouth daily.  . cetirizine (ZYRTEC) 10 MG tablet Take 10 mg by mouth daily. As needed  . cyanocobalamin (,VITAMIN B-12,) 1000 MCG/ML injection 1 ml IM daily for a week then weekly for 4 weeks then monthly  . escitalopram (LEXAPRO) 20 MG tablet Take 1 tablet (20 mg total) by mouth daily.  . fluticasone (FLONASE)  50 MCG/ACT nasal spray Place 2 sprays into both nostrils daily.  Marland Kitchen HYDROcodone-homatropine (HYCODAN) 5-1.5 MG/5ML syrup Take 5 mLs by mouth every 12 (twelve) hours as needed for cough.  Marland Kitchen ipratropium-albuterol (DUONEB) 0.5-2.5 (3) MG/3ML SOLN Take 3 mL by nebulization every 6 (six) hours as needed.  Marland Kitchen levothyroxine (SYNTHROID, LEVOTHROID) 112 MCG tablet Take 1 tablet (112 mcg total) by mouth daily.  . meclizine (ANTIVERT) 12.5 MG tablet Take 1 tablet (12.5 mg total) by mouth 3 (three) times daily as needed for dizziness.  . metFORMIN (GLUCOPHAGE) 500 MG tablet Take 1 tablet (500 mg total) by mouth 2 (two) times daily with a meal.  . metoprolol succinate (TOPROL-XL) 25 MG 24 hr tablet TAKE 1 TABLET (25 MG TOTAL) BY MOUTH DAILY.  . mometasone-formoterol (DULERA) 100-5 MCG/ACT AERO 2 puffs then rinse mouth, twice daily maintenance  . PARoxetine (PAXIL) 40 MG tablet TAKE 1 TABLET (40 MG TOTAL) BY MOUTH EVERY MORNING.   No facility-administered encounter medications on file as of 04/24/2015.      Review of Systems  Constitutional: Positive for activity change.  Respiratory: Positive for shortness of breath and wheezing.   Cardiovascular: Negative.   Neurological: Negative.   Psychiatric/Behavioral: Positive for decreased concentration.       Objective:   Physical Exam  Constitutional: She is oriented to person, place, and time. She appears well-developed.  Cardiovascular: Normal  rate and regular rhythm.   Pulmonary/Chest: Effort normal. She has wheezes.  Neurological: She is alert and oriented to person, place, and time.          Assessment & Plan:   1. Essential hypertension Blood pressures are well controlled on metoprolol.  2. Depression Continue Lexapro 10 mg with the goal of making her more active and less apathetic  3. Asthma with COPD ([place]) Problem is stable. She uses ipratropium and albuterol as an amp as well as inhaled steroid and L AB  Wardell Honour MD

## 2015-05-18 ENCOUNTER — Encounter: Payer: Self-pay | Admitting: *Deleted

## 2015-05-29 ENCOUNTER — Other Ambulatory Visit: Payer: Self-pay | Admitting: Family Medicine

## 2015-06-09 ENCOUNTER — Other Ambulatory Visit: Payer: Self-pay | Admitting: Family Medicine

## 2015-06-12 ENCOUNTER — Encounter: Payer: Self-pay | Admitting: Family Medicine

## 2015-06-12 ENCOUNTER — Ambulatory Visit (INDEPENDENT_AMBULATORY_CARE_PROVIDER_SITE_OTHER): Payer: Medicare Other | Admitting: Family Medicine

## 2015-06-12 VITALS — BP 115/69 | HR 77 | Temp 97.1°F | Ht 66.0 in | Wt 186.0 lb

## 2015-06-12 DIAGNOSIS — E119 Type 2 diabetes mellitus without complications: Secondary | ICD-10-CM

## 2015-06-12 DIAGNOSIS — G609 Hereditary and idiopathic neuropathy, unspecified: Secondary | ICD-10-CM | POA: Diagnosis not present

## 2015-06-12 MED ORDER — GABAPENTIN 300 MG PO CAPS: 300.0000 mg | ORAL_CAPSULE | Freq: Every day | ORAL | Status: DC

## 2015-06-12 MED ORDER — CYANOCOBALAMIN 1000 MCG/ML IJ SOLN: INTRAMUSCULAR | Status: DC

## 2015-06-12 NOTE — Progress Notes (Signed)
Subjective:    Patient ID: Jordan [name], female    DOB: 1940-09-14, 75 y.o.   MRN: [id number]  HPI Patient here today for symptoms that has started since a change in her antidepressant. Symptoms really go back before we switched from Paxil to Lexapro. She is a diabetic and has been so for some time. Tingling and burning in her legs and feet are worse at night. Even though she thinks symptoms are related to Lexapro I suspect it's more a function of diabetic neuropathy.       Patient Active Problem List   Diagnosis Date Noted  . Anxiety   . Depression   . Diabetes mellitus without complication ([place])   . Hyperlipidemia   . Hypertension   . Thyroid disease   . Seasonal allergic rhinitis 08/18/2013  . Asthma with COPD ([place]) 10/06/2012   Outpatient Encounter Prescriptions as of 06/12/2015  Medication Sig  . albuterol (PROVENTIL HFA;VENTOLIN HFA) 108 (90 Base) MCG/ACT inhaler Inhale 2 puffs into the lungs every 4 (four) hours as needed for wheezing or shortness of breath.  . ALPRAZolam (XANAX) 0.5 MG tablet 1-2 tablets twice a day as needed  . aspirin 81 MG tablet Take 81 mg by mouth daily.  . cetirizine (ZYRTEC) 10 MG tablet Take 10 mg by mouth daily. As needed  . cyanocobalamin (,VITAMIN B-12,) 1000 MCG/ML injection 1 ml IM daily for a week then weekly for 4 weeks then monthly  . escitalopram (LEXAPRO) 20 MG tablet Take 1 tablet (20 mg total) by mouth daily.  . fluticasone (FLONASE) 50 MCG/ACT nasal spray Place 2 sprays into both nostrils daily.  Marland Kitchen HYDROcodone-homatropine (HYCODAN) 5-1.5 MG/5ML syrup Take 5 mLs by mouth every 12 (twelve) hours as needed for cough.  Marland Kitchen ipratropium-albuterol (DUONEB) 0.5-2.5 (3) MG/3ML SOLN Take 3 mL by nebulization every 6 (six) hours as needed.  Marland Kitchen levothyroxine (SYNTHROID, LEVOTHROID) 112 MCG tablet TAKE 1 TABLET (112 MCG TOTAL) BY MOUTH DAILY.  . meclizine (ANTIVERT) 12.5 MG tablet Take 1 tablet (12.5 mg total) by mouth 3 (three) times daily as needed  for dizziness.  . metFORMIN (GLUCOPHAGE) 500 MG tablet Take 1 tablet (500 mg total) by mouth 2 (two) times daily with a meal.  . metFORMIN (GLUCOPHAGE) 500 MG tablet TAKE 1 TABLET (500 MG TOTAL) BY MOUTH 2 (TWO) TIMES DAILY WITH A MEAL.  . metoprolol succinate (TOPROL-XL) 25 MG 24 hr tablet TAKE 1 TABLET (25 MG TOTAL) BY MOUTH DAILY.  . mometasone-formoterol (DULERA) 100-5 MCG/ACT AERO 2 puffs then rinse mouth, twice daily maintenance  . [DISCONTINUED] PARoxetine (PAXIL) 40 MG tablet TAKE 1 TABLET (40 MG TOTAL) BY MOUTH EVERY MORNING.   No facility-administered encounter medications on file as of 06/12/2015.     Review of Systems  Constitutional: Negative.   HENT: Negative.   Eyes: Negative.   Respiratory: Negative.   Cardiovascular: Negative.   Gastrointestinal: Positive for abdominal pain (bloating) and diarrhea.  Endocrine: Negative.   Genitourinary: Positive for pelvic pain (lower).  Musculoskeletal: Negative.        Tingle and burning in legs and feet  Skin: Negative.   Allergic/Immunologic: Negative.   Neurological: Negative.   Hematological: Negative.   Psychiatric/Behavioral: Negative.        Objective:   Physical Exam  Constitutional: She is oriented to person, place, and time. She appears well-developed and well-nourished.  Cardiovascular: Regular rhythm.   Pulmonary/Chest: Effort normal and breath sounds normal.  Neurological: She is alert and oriented to person, place,  and time.  There is decreased sensation in both feet with monofilament. Pulses are good bilaterally.    BP 115/69 mmHg  Pulse 77  Temp(Src) 97.1 F (36.2 C) (Oral)  Ht 5\' 6"  (1.676 m)  Wt 186 lb (84.369 kg)  BMI 30.04 kg/m2       Assessment & Plan:   1. Diabetes mellitus without complication ([place]) Leave her symptoms are related to diabetes. We'll begin gabapentin 300 mg at night if symptoms are not improved after 1 week increase to 600 mg at night. Have encouraged her to continue with  Lexapro rather than going back to Paxil which she suggested.  2. Hereditary and idiopathic peripheral neuropathy See above for discussion. I think her symptoms are related to diabetes  Wardell Honour MD

## 2015-06-13 ENCOUNTER — Other Ambulatory Visit: Payer: Self-pay | Admitting: Family Medicine

## 2015-07-24 ENCOUNTER — Ambulatory Visit: Payer: Medicare Other | Admitting: Family Medicine

## 2015-07-25 ENCOUNTER — Ambulatory Visit (INDEPENDENT_AMBULATORY_CARE_PROVIDER_SITE_OTHER): Payer: Medicare Other | Admitting: Internal Medicine

## 2015-07-25 ENCOUNTER — Encounter: Payer: Self-pay | Admitting: Internal Medicine

## 2015-07-25 VITALS — BP 120/82 | HR 67 | Ht 66.0 in | Wt 184.2 lb

## 2015-07-25 DIAGNOSIS — H6501 Acute serous otitis media, right ear: Secondary | ICD-10-CM

## 2015-07-25 DIAGNOSIS — R1310 Dysphagia, unspecified: Secondary | ICD-10-CM | POA: Diagnosis not present

## 2015-07-25 DIAGNOSIS — J302 Other seasonal allergic rhinitis: Secondary | ICD-10-CM

## 2015-07-25 DIAGNOSIS — H6690 Otitis media, unspecified, unspecified ear: Secondary | ICD-10-CM | POA: Insufficient documentation

## 2015-07-25 NOTE — Assessment & Plan Note (Signed)
Increased nasal stuffiness with increased postnasal drip. No distinct headache. Plan-treat this as possible early sinusitis with concern it may be contributing to her throat complaints.Zpak

## 2015-07-25 NOTE — Patient Instructions (Signed)
Script sent for Z pak antibiotic  Order- schedule Speech Therapy Modified Barium Swallow     Dx dysphagia

## 2015-07-25 NOTE — Progress Notes (Signed)
10/06/12- 34 yoF former smoler referred courtesy of Dr Laurance Flatten at [facility] for COPD.  Dr Morrie Sheldon; COPD[place] Friend [place] Childhood asthma and allergy managed at [facility] until resolved around age 75. Subsequently 3ppd smoker until quit in 2000. Treated episodically until hospitalized with acute COPD exacerbation Oct, 2013 and sent home with continuous O2 2L/ [facility]. Depends on nebulizer 4-5x/day. Moved here 3 weeks ago, into an older house, central air, no mold. Feeling more short of breath with ADLs and noting daily cough thick white phlegm. Feet swell at times. Gets sustained chest tight, and may note tachycardia after neb.No blood, fever, purulent or exertional chest pain.  Diabetic, hypertensive, but denies heart disease.  May have had pneumonia, never pneumovax.  10/22/12-  68 yoF former smoker referred courtesy of Dr Laurance Flatten at [facility] for COPD FOLLOWS [id number] much better since last visit!!! "Feels like a new person" Feeling much better. Still some shortness of breath in the shower. Has home oxygen from the hospital, using when necessary now at 2 L/ [facility]. She did not qualify for home oxygen during exertion with exercise test by Dr. Laurance Flatten. CXR 10/18/12 IMPRESSION:  No acute findings.  Original Report Authenticated By: Skipper Cliche, M.D.  11/19/12- 72 yoF former smoker referred courtesy of Dr Laurance Flatten at [facility] for COPD FOLLOWS FOR:  Breathing unchanged since last OV.  Having construction in home, so as of yesterday chest feeling heavy due the dust  Continues oxygen 2 L for sleep/ [place].  02/18/13- 35 yoF former smoker referred courtesy of Dr Laurance Flatten at [facility] for COPD FOLLOWS FOR: drainage at night causing a cough-feels like she is taking a cold Continues oxygen 2 L/[facility]. Breathing has been well-controlled with occasional mild wheeze. She only uses Dulera once or twice daily, keeping dose down to avoid shaking. Nebulizer twice  daily.  08/18/13- 36 yoF former smoker referred courtesy of Dr Laurance Flatten at [facility] for [place]: having to stay indoors more often-hot weather causing her to have trouble breathing. DME is [facility]-needs to have ONO repeated for [facility] documentation and pt states she uses her O2 2L during the day as well only as needed. Little cough, some postnasal drip. Daily zyrtec.Using neb 1-2x daily and occ rescue inhaler. She says control is adequate.  01/19/14- 11 yoF former smoker referred courtesy of Dr Laurance Flatten at [facility] for COPD FOLLOW FOR: Asthma w/ COPD; breathing doing better, no complaints.  O2 2L/ [facility]. Has Z pak on hold. [place] 08/23/13- Qualified for sleep O2 2L CXR 08/18/13-  IMPRESSION: No active cardiopulmonary disease. Stable mild cardiomegaly and biapical pleural thickening. Electronically Signed  By: Ivar Drape M.D.  On: 08/18/2013 10:20  07/19/14- 35 yoF former smoker followed for Asthma with COPD breathing do well until 2 mths ago, pt got congestion, wheezing, SOB, and prod cough w/white mucus O2 2L/ Advanced for sleep Nasal and chest congestion 2 months, postnasal drip at night. See pack from primary physician 3 weeks ago did help. Residual cough with clear mucus, no fever. CXR 06/09/14-I reviewed the images with her FINDINGS: Mild hyperinflation without pneumonia or edema. No effusion or pneumothorax. There is chronic and symmetric biapical pleural thickening consistent with scarring. Normal heart size and aortic contours. Calcified breast implants noted. IMPRESSION: No active cardiopulmonary disease. Chronic hyperinflation. Electronically Signed  By: Monte Fantasia M.D.  On: 06/09/2014 13:49  07/25/2015-75 year old female former smoker followed for Asthma/COPD complicated by allergic rhinitis, HBP, DM 2/peripheral neuropathy FOLLOWS FOR:  Pt states she is having trouble with swallowing-(feels as though throat is closed up and wont let anythig go down) started get choked  when eating about 4 months ago. Pt then has issues with breathing and allergies due to heat and pollen count; drainage. Asking for Prevnar vaccine Complains that for 4 months she is aware not swallowing easily, chokes with the eating, food doesn't go down right. Symptoms persistent. Minimal hoarseness, no pain. 1 recalled episode of reflux with heartburn. Aware of spring time pollen rhinitis and drainage but her throat and swollen complaints began a couple of months before that. Mild chest tightness and wheeze without acute attack. Uses rescue inhaler up to once or twice daily. CXR 12/18/14 IMPRESSION: Mild left base atelectatic change. No edema or consolidation. Electronically Signed  By: Lowella Grip III M.D.  On: 12/18/2014 11:34  ROS-see HPI Constitutional:   No-   weight loss, night sweats, fevers, chills, fatigue, lassitude. HEENT:   No- headaches, +difficulty swallowing, tooth/dental problems, sore throat,       No- sneezing,no- itching, ear ache, +nasal congestion, +post nasal drip,  CV:  No-chest pain, orthopnea, PND, no-swelling in lower extremities, anasarca, dizziness, +palpitations Resp: +shortness of breath with exertion or at rest.              No- productive cough,  + non-productive cough,  No- coughing up of blood.              No-   change in color of mucus.  + wheezing.   Skin: No-   rash or lesions. GI:  No-   heartburn, indigestion, abdominal pain, nausea, vomiting,  GU:  MS:  No-   joint pain or swelling.  . Neuro-  +tremor with sympathetic meds Psych:  No- change in mood or affect. +depression or anxiety.  No memory loss.  OBJ- Physical Exam  General- Alert, Oriented, Affect+mildly anxious, Distress- none acute,  Skin- rash-none, lesions- none, excoriation- none Lymphadenopathy- none Head- atraumatic            Eyes- Gross vision intact, PERRLA, conjunctivae and secretions clear            Ears- R TM redd            Nose- Clear, no-Septal dev, mucus,  polyps, erosion, perforation             Throat- Mallampati II I-IV, mucosa clear , drainage- none, tonsils- atrophic, not hoarse Neck- flexible , trachea midline, no stridor , thyroid nl, carotid no bruit Chest - symmetrical excursion , unlabored           Heart/CV- RRR , no murmur , no gallop  , no rub, nl s1 s2                           - JVD- none , edema-+trace, stasis changes- none, varices- none           Lung- +diminished/ trace wheeze/ clear, cough- none , dullness-none, rub- none           Chest wall-  Abd-  Br/ Gen/ Rectal- Not done, not indicated Extrem- cyanosis- none, clubbing, none, atrophy- none, strength- nl Neuro- grossly intact to observation

## 2015-07-25 NOTE — Assessment & Plan Note (Signed)
Persistent dysphagia described by this former smoker may be benign, or not. Plan-reflux precautions. Speech therapy supervised modified barium swallow.

## 2015-07-31 ENCOUNTER — Telehealth: Payer: Self-pay | Admitting: Internal Medicine

## 2015-07-31 MED ORDER — AZITHROMYCIN 250 MG PO TABS: ORAL_TABLET | ORAL | Status: DC

## 2015-07-31 NOTE — Telephone Encounter (Signed)
Patient stated that Dr. Annamaria Boots was to send in a Zpak for her, but the pharmacy did not receive it. Per Dr. Janee Morn last [place], patient was to get Zpak. Sent Rx. Patient aware. Nothing further needed.

## 2015-08-14 ENCOUNTER — Ambulatory Visit (INDEPENDENT_AMBULATORY_CARE_PROVIDER_SITE_OTHER): Payer: Medicare Other | Admitting: Family Medicine

## 2015-08-14 ENCOUNTER — Encounter: Payer: Self-pay | Admitting: Family Medicine

## 2015-08-14 VITALS — BP 118/70 | HR 78 | Temp 97.7°F | Ht 66.0 in | Wt 179.0 lb

## 2015-08-14 DIAGNOSIS — R062 Wheezing: Secondary | ICD-10-CM | POA: Diagnosis not present

## 2015-08-14 DIAGNOSIS — G44219 Episodic tension-type headache, not intractable: Secondary | ICD-10-CM

## 2015-08-14 DIAGNOSIS — J309 Allergic rhinitis, unspecified: Secondary | ICD-10-CM

## 2015-08-14 DIAGNOSIS — J449 Chronic obstructive pulmonary disease, unspecified: Secondary | ICD-10-CM | POA: Diagnosis not present

## 2015-08-14 DIAGNOSIS — R0602 Shortness of breath: Secondary | ICD-10-CM

## 2015-08-14 DIAGNOSIS — J45909 Unspecified asthma, uncomplicated: Secondary | ICD-10-CM | POA: Diagnosis not present

## 2015-08-14 DIAGNOSIS — S91002A Unspecified open wound, left ankle, initial encounter: Secondary | ICD-10-CM

## 2015-08-14 DIAGNOSIS — Z23 Encounter for immunization: Secondary | ICD-10-CM | POA: Diagnosis not present

## 2015-08-14 NOTE — Patient Instructions (Addendum)
Use BREO inhaler that we gave you - 1 puff once a day - rinse mouth afterward  Drink plenty of fluids Wear support hose regularly----but these on the first thing in the morning with arising Use Flonase regularly Use Tylenol as needed for aches, pains and fever Decrease the Lexapro to 10 mg a day - night time Use nasal saline in the day time Decrease Sodium intake

## 2015-08-14 NOTE — Progress Notes (Signed)
 Subjective:    Patient ID: Jordan [name], female    DOB: 05/07/1940, 75 y.o.   MRN: [id number]  HPI Patient here today for left ankle redness from a wound that happened 3-4 weeks ago when she hit it on a bed frame. The wound appears that it has actually healed. She says that her leg gets swollen during the day. She also had multiple other complaints and indicated that she had stopped the Lexapro that Dr. Miller has started her on. She says that she has headaches. She also had problems with the Paxil that she had been on prior to the Lexapro. She does see the pulmonologist regularly and has had a recent round of Zithromax. She denied any chest pain.    Patient Active Problem List   Diagnosis Date Noted  . Dysphagia 07/25/2015  . Otitis media, acute 07/25/2015  . 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 Anxiety   . Depression   . Diabetes mellitus without complication ([facility])   . Hyperlipidemia   . Hypertension   . Thyroid disease   . Seasonal allergic rhinitis 08/18/2013  . Asthma with COPD ([facility]) 10/06/2012   Outpatient Encounter Prescriptions as of 08/14/2015  Medication Sig  . albuterol (PROVENTIL HFA;VENTOLIN HFA) 108 (90 Base) MCG/ACT inhaler Inhale 2 puffs into the lungs every 4 (four) hours as needed for wheezing or shortness of breath.  . ALPRAZolam (XANAX) 0.5 MG tablet 1-2 tablets twice a day as needed  . aspirin 81 MG tablet Take 81 mg by mouth daily.  . cetirizine (ZYRTEC) 10 MG tablet Take 10 mg by mouth daily. As needed  . cyanocobalamin (,VITAMIN B-12,) 1000 MCG/ML injection 1 ml IM monthly  . escitalopram (LEXAPRO) 20 MG tablet Take 1 tablet (20 mg total) by mouth daily.  . fluticasone (FLONASE) 50 MCG/ACT nasal spray Place 2 sprays into both nostrils daily.  . gabapentin (NEURONTIN) 300 MG capsule Take 1 capsule (300 mg total) by mouth at bedtime.  . HYDROcodone-homatropine (HYCODAN) 5-1.5 MG/5ML syrup Take 5 mLs by mouth every 12 (twelve) hours as  needed for cough.  . ipratropium-albuterol (DUONEB) 0.5-2.5 (3) MG/3ML SOLN Take 3 mL by nebulization every 6 (six) hours as needed.  . levothyroxine (SYNTHROID, LEVOTHROID) 112 MCG tablet TAKE 1 TABLET (112 MCG TOTAL) BY MOUTH DAILY.  . meclizine (ANTIVERT) 12.5 MG tablet Take 1 tablet (12.5 mg total) by mouth 3 (three) times daily as needed for dizziness.  . metFORMIN (GLUCOPHAGE) 500 MG tablet Take 1 tablet (500 mg total) by mouth 2 (two) times daily with a meal.  . metoprolol succinate (TOPROL-XL) 25 MG 24 hr tablet TAKE 1 TABLET (25 MG TOTAL) BY MOUTH DAILY.  . mometasone-formoterol (DULERA) 100-5 MCG/ACT AERO 2 puffs then rinse mouth, twice daily maintenance  . [DISCONTINUED] azithromycin (ZITHROMAX) 250 MG tablet Take 2 tablets today, then 1 tablet daily until gone.   No facility-administered encounter medications on file as of 08/14/2015.      Review of Systems  Constitutional: Negative.   HENT: Negative.   Eyes: Negative.   Respiratory: Negative.   Cardiovascular: Negative.   Gastrointestinal: Negative.   Endocrine: Negative.   Genitourinary: Negative.   Musculoskeletal: Negative.   Skin: Positive for wound (left ankle - hit on a bed frame).  Allergic/Immunologic: Negative.   Neurological: Negative.   Hematological: Negative.   Psychiatric/Behavioral: Negative.        Objective:   Physical Exam  Constitutional: She is oriented to person, place, and time. She appears   well-developed and well-nourished. She appears distressed.  HENT:  Head: Normocephalic and atraumatic.  Right Ear: External ear normal.  Left Ear: External ear normal.  Mouth/Throat: Oropharynx is clear and moist.  Nasal congestion and turbinate swelling bilaterally  Eyes: Conjunctivae and EOM are normal. Pupils are equal, round, and reactive to light. Right eye exhibits no discharge. Left eye exhibits no discharge. No scleral icterus.  Neck: Normal range of motion. Neck supple. No thyromegaly present.    Cardiovascular: Normal rate, regular rhythm, normal heart sounds and intact distal pulses.  Exam reveals no gallop and no friction rub.   No murmur heard. The heart had a regular rate and rhythm at 72/m  Pulmonary/Chest: Effort normal. No respiratory distress. She has wheezes. She has no rales. She exhibits no tenderness.  There was some tightness and bronchospasm with sparse wheezes bilaterally  Musculoskeletal: Normal range of motion. She exhibits no edema or tenderness.  There was a healed tiny quarter inch wound over a blood vessel of the left lower leg above the lateral malleolus. There was no warmth to the area the area had no drainage it was completely healed. It was tender to palpation. There was no significant edema.  Lymphadenopathy:    She has no cervical adenopathy.  Neurological: She is alert and oriented to person, place, and time.  Skin: Skin is warm and dry. No rash noted. No erythema.  Psychiatric: She has a normal mood and affect. Her behavior is normal. Judgment and thought content normal.  Nursing note and vitals reviewed.  BP 118/70 mmHg  Pulse 78  Temp(Src) 97.7 F (36.5 C) (Oral)  Ht 5' 6" (1.676 m)  Wt 179 lb (81.194 kg)  BMI 28.91 kg/m2        Assessment & Plan:  1. Wound of left ankle, initial encounter -Wear support hose the should be placed on the legs upon first arising in the morning - Td : Tetanus/diphtheria >7yo Preservative  free - CBC with Differential/Platelet - BMP8+EGFR  2. Episodic tension-type headache, not intractable -Use Flonase regularly 1 spray each nostril daily  3. Wheezing -Continue to use albuterol inhaler as a rescue inhaler and use Breo sample 1 puff daily and rinse mouth after use  4. SOB (shortness of breath) -Use albuterol inhaler as a rescue inhaler and use Brio as directed 1 puff daily and rinse mouth after using  5. Asthma with COPD ([facility]) -Continue to follow-up with pulmonology  6. Allergic rhinitis, unspecified  allergic rhinitis type -Use Flonase 1 puff R1 spray each nostril at bedtime and use nasal saline frequently during the day  No orders of the defined types were placed in this encounter.   Patient Instructions  Use BREO inhaler that we gave you - 1 puff once a day - rinse mouth afterward  Drink plenty of fluids Wear support hose regularly----but these on the first thing in the morning with arising Use Flonase regularly Use Tylenol as needed for aches, pains and fever Decrease the Lexapro to 10 mg a day - night time Use nasal saline in the day time Decrease Sodium intake    Don W. Moore MD   

## 2015-08-15 LAB — CBC WITH DIFFERENTIAL/PLATELET
BASOS: 2 %
Basophils Absolute: 0.2 10*3/uL (ref 0.0–0.2)
EOS (ABSOLUTE): 0.8 10*3/uL — ABNORMAL HIGH (ref 0.0–0.4)
EOS: 8 %
HEMATOCRIT: 46.3 % (ref 34.0–46.6)
Hemoglobin: 14.8 g/dL (ref 11.1–15.9)
Immature Grans (Abs): 0 10*3/uL (ref 0.0–0.1)
Immature Granulocytes: 0 %
LYMPHS ABS: 1.8 10*3/uL (ref 0.7–3.1)
Lymphs: 18 %
MCH: 25.3 pg — ABNORMAL LOW (ref 26.6–33.0)
MCHC: 32 g/dL (ref 31.5–35.7)
MCV: 79 fL (ref 79–97)
MONOS ABS: 1 10*3/uL — AB (ref 0.1–0.9)
Monocytes: 10 %
Neutrophils Absolute: 6.3 10*3/uL (ref 1.4–7.0)
Neutrophils: 62 %
Platelets: 691 10*3/uL — ABNORMAL HIGH (ref 150–379)
RBC: 5.84 x10E6/uL — ABNORMAL HIGH (ref 3.77–5.28)
RDW: 15.9 % — AB (ref 12.3–15.4)
WBC: 10.1 10*3/uL (ref 3.4–10.8)

## 2015-08-15 LAB — BMP8+EGFR
BUN / CREAT RATIO: 11 — AB (ref 12–28)
BUN: 11 mg/dL (ref 8–27)
CO2: 23 mmol/L (ref 18–29)
Calcium: 9.8 mg/dL (ref 8.7–10.3)
Chloride: 96 mmol/L (ref 96–106)
Creatinine, Ser: 0.97 mg/dL (ref 0.57–1.00)
GFR, EST AFRICAN AMERICAN: 66 mL/min/{1.73_m2} (ref >59)
GFR, EST NON AFRICAN AMERICAN: 57 mL/min/{1.73_m2} — AB (ref >59)
Glucose: 98 mg/dL (ref 65–99)
POTASSIUM: 5.3 mmol/L — AB (ref 3.5–5.2)
SODIUM: 139 mmol/L (ref 134–144)

## 2015-08-15 NOTE — Addendum Note (Signed)
Addended by: Jamelle Haring on: 08/15/2015 10:04 AM   Modules accepted: Orders

## 2015-08-21 ENCOUNTER — Ambulatory Visit: Payer: Medicare Other | Admitting: Family Medicine

## 2015-08-21 DIAGNOSIS — M722 Plantar fascial fibromatosis: Secondary | ICD-10-CM | POA: Diagnosis not present

## 2015-08-21 DIAGNOSIS — M19071 Primary osteoarthritis, right ankle and foot: Secondary | ICD-10-CM | POA: Diagnosis not present

## 2015-08-22 ENCOUNTER — Ambulatory Visit (INDEPENDENT_AMBULATORY_CARE_PROVIDER_SITE_OTHER): Payer: Medicare Other | Admitting: Family Medicine

## 2015-08-22 ENCOUNTER — Encounter: Payer: Self-pay | Admitting: Family Medicine

## 2015-08-22 VITALS — BP 120/76 | HR 75 | Temp 97.2°F | Ht 66.0 in | Wt 182.6 lb

## 2015-08-22 DIAGNOSIS — F329 Major depressive disorder, single episode, unspecified: Secondary | ICD-10-CM

## 2015-08-22 DIAGNOSIS — E119 Type 2 diabetes mellitus without complications: Secondary | ICD-10-CM

## 2015-08-22 DIAGNOSIS — R1310 Dysphagia, unspecified: Secondary | ICD-10-CM

## 2015-08-22 DIAGNOSIS — F32A Depression, unspecified: Secondary | ICD-10-CM

## 2015-08-22 NOTE — Progress Notes (Signed)
Subjective:    Patient ID: Jordan [name], female    DOB: 11/13/1940, 75 y.o.   MRN: [id number]  HPI 75 year old female here to follow-up wound on her ankle and her breathing. She was seen last week and had a small area on the left lateral heel where she had hit a bed frame. That is healed. Yesterday she was seen at an orthopedic office in [place]. She was placed in a walking boot for reasons that are not really clear apparently she had some fracture of her ankle previously but was told yesterday she had plantar fasciitis. She received an injection in the ankle joint yesterday.  She had been spending lots of time in bed. There was a question of whether or not this was related to depression versus dementia. She had been on Paxil for a long time and Lexapro was started and even though she was taking only half a dose she complained of hallucinations and other side effects to the Lexapro and prefers to go back to the Paxil.  Also saw Dr. Annamaria Boots her pulmonary specialist who treated her for a sinus infection. She thinks that has not resolved and she is requesting a refill of her Zithromax.  Patient Active Problem List   Diagnosis Date Noted  . Dysphagia 07/25/2015  . Otitis media, acute 07/25/2015  . 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 Anxiety   . Depression   . Diabetes mellitus without complication ([place])   . Hyperlipidemia   . Hypertension   . Thyroid disease   . Seasonal allergic rhinitis 08/18/2013  . Asthma with COPD ([place]) 10/06/2012   Outpatient Encounter Prescriptions as of 08/22/2015  Medication Sig  . albuterol (PROVENTIL HFA;VENTOLIN HFA) 108 (90 Base) MCG/ACT inhaler Inhale 2 puffs into the lungs every 4 (four) hours as needed for wheezing or shortness of breath.  . ALPRAZolam (XANAX) 0.5 MG tablet 1-2 tablets twice a day as needed  . aspirin 81 MG tablet Take 81 mg by mouth daily.  . cetirizine (ZYRTEC) 10 MG tablet Take 10 mg by mouth daily. As needed  .  cyanocobalamin (,VITAMIN B-12,) 1000 MCG/ML injection 1 ml IM monthly  . escitalopram (LEXAPRO) 20 MG tablet Take 1 tablet (20 mg total) by mouth daily.  . fluticasone (FLONASE) 50 MCG/ACT nasal spray Place 2 sprays into both nostrils daily.  Marland Kitchen gabapentin (NEURONTIN) 300 MG capsule Take 1 capsule (300 mg total) by mouth at bedtime.  Marland Kitchen HYDROcodone-homatropine (HYCODAN) 5-1.5 MG/5ML syrup Take 5 mLs by mouth every 12 (twelve) hours as needed for cough.  Marland Kitchen ipratropium-albuterol (DUONEB) 0.5-2.5 (3) MG/3ML SOLN Take 3 mL by nebulization every 6 (six) hours as needed.  Marland Kitchen levothyroxine (SYNTHROID, LEVOTHROID) 112 MCG tablet TAKE 1 TABLET (112 MCG TOTAL) BY MOUTH DAILY.  . meclizine (ANTIVERT) 12.5 MG tablet Take 1 tablet (12.5 mg total) by mouth 3 (three) times daily as needed for dizziness.  . metFORMIN (GLUCOPHAGE) 500 MG tablet Take 1 tablet (500 mg total) by mouth 2 (two) times daily with a meal.  . metoprolol succinate (TOPROL-XL) 25 MG 24 hr tablet TAKE 1 TABLET (25 MG TOTAL) BY MOUTH DAILY.   No facility-administered encounter medications on file as of 08/22/2015.      Review of Systems  Constitutional: Negative.   HENT: Positive for congestion and sinus pressure.   Respiratory: Positive for shortness of breath.   Cardiovascular: Negative.   Psychiatric/Behavioral: Positive for confusion and decreased concentration.       Objective:  Physical Exam  Constitutional: She is oriented to person, place, and time. She appears well-developed and well-nourished.  Cardiovascular: Normal rate and regular rhythm.   Pulmonary/Chest: Effort normal. She has wheezes.  Neurological: She is alert and oriented to person, place, and time.  Psychiatric: She has a normal mood and affect.   BP 120/76 mmHg  Pulse 75  Temp(Src) 97.2 F (36.2 C) (Oral)  Ht 5\' 6"  (1.676 m)  Wt 182 lb 9.6 oz (82.827 kg)  BMI 29.49 kg/m2  SpO2 94%        Assessment & Plan:  1. Dysphagia Pulmonary referred her to  speech therapy for some dysphagia and hoarseness  2. Diabetes mellitus without complication ([facility]) Last 123456 was 6.0. Continue with metformin  3. Depression At her request will discontinue Lexapro and restart Paxil 20 mg. She thinks she felt better while taking Paxil.  Wardell Honour MD

## 2015-08-23 ENCOUNTER — Telehealth: Payer: Self-pay | Admitting: Family Medicine

## 2015-08-23 MED ORDER — PAROXETINE HCL 20 MG PO TABS: 20.0000 mg | ORAL_TABLET | Freq: Every day | ORAL | Status: DC

## 2015-08-23 MED ORDER — AZITHROMYCIN 250 MG PO TABS
ORAL_TABLET | ORAL | Status: DC
Start: 2015-08-23 — End: 2015-11-22

## 2015-08-23 NOTE — Telephone Encounter (Signed)
Patient aware that medications have been sent in

## 2015-10-31 DIAGNOSIS — H353111 Nonexudative age-related macular degeneration, right eye, early dry stage: Secondary | ICD-10-CM | POA: Diagnosis not present

## 2015-10-31 DIAGNOSIS — H524 Presbyopia: Secondary | ICD-10-CM | POA: Diagnosis not present

## 2015-10-31 DIAGNOSIS — H26493 Other secondary cataract, bilateral: Secondary | ICD-10-CM | POA: Diagnosis not present

## 2015-10-31 DIAGNOSIS — H52223 Regular astigmatism, bilateral: Secondary | ICD-10-CM | POA: Diagnosis not present

## 2015-10-31 DIAGNOSIS — H04123 Dry eye syndrome of bilateral lacrimal glands: Secondary | ICD-10-CM | POA: Diagnosis not present

## 2015-10-31 DIAGNOSIS — H353122 Nonexudative age-related macular degeneration, left eye, intermediate dry stage: Secondary | ICD-10-CM | POA: Diagnosis not present

## 2015-10-31 DIAGNOSIS — H5213 Myopia, bilateral: Secondary | ICD-10-CM | POA: Diagnosis not present

## 2015-11-20 ENCOUNTER — Other Ambulatory Visit: Payer: Self-pay | Admitting: Family Medicine

## 2015-11-22 ENCOUNTER — Ambulatory Visit (INDEPENDENT_AMBULATORY_CARE_PROVIDER_SITE_OTHER): Payer: Medicare Other | Admitting: Physician Assistant

## 2015-11-22 ENCOUNTER — Encounter: Payer: Self-pay | Admitting: Physician Assistant

## 2015-11-22 VITALS — BP 138/72 | HR 58 | Temp 97.4°F | Ht 66.0 in | Wt 172.8 lb

## 2015-11-22 DIAGNOSIS — T7840XS Allergy, unspecified, sequela: Secondary | ICD-10-CM

## 2015-11-22 DIAGNOSIS — R112 Nausea with vomiting, unspecified: Secondary | ICD-10-CM | POA: Diagnosis not present

## 2015-11-22 DIAGNOSIS — R51 Headache: Secondary | ICD-10-CM | POA: Diagnosis not present

## 2015-11-22 DIAGNOSIS — Z888 Allergy status to other drugs, medicaments and biological substances status: Secondary | ICD-10-CM | POA: Diagnosis not present

## 2015-11-22 DIAGNOSIS — H5712 Ocular pain, left eye: Secondary | ICD-10-CM | POA: Diagnosis not present

## 2015-11-22 DIAGNOSIS — R519 Headache, unspecified: Secondary | ICD-10-CM

## 2015-11-22 MED ORDER — PROMETHAZINE HCL 25 MG/ML IJ SOLN
25.0000 mg | Freq: Once | INTRAMUSCULAR | Status: AC
  Administered 2015-11-22: 25 mg via INTRAMUSCULAR

## 2015-11-22 MED ORDER — PROMETHAZINE HCL 12.5 MG PO TABS: 12.5000 mg | ORAL_TABLET | Freq: Four times a day (QID) | ORAL | 0 refills | Status: DC | PRN

## 2015-11-22 NOTE — Patient Instructions (Signed)

## 2015-11-22 NOTE — Progress Notes (Signed)
BP 138/72 (BP Location: Left Arm, Patient Position: Sitting, Cuff Size: Large)   Pulse (!) 58   Temp 97.4 F (36.3 C) (Oral)   Ht 5\' 6"  (1.676 m)   Wt 172 lb 12.8 oz (78.4 kg)   BMI 27.89 kg/m    Subjective:    Patient ID: Jordan [name], female    DOB: 05-09-1940, 75 y.o.   MRN: [id number]  HPI: Jordan [name] is a 75 y.o. female presenting on 11/22/2015 for Nausea; Fatigue; not eating; Not thinking clear; Eye Pain (left x 2 weeks ); Diarrhea; and Headache  2 weeks ago this patient was seen by her ophthalmologist in [place]. All she was having her eye exam he was found that she had a hair in her left eye. He was able to remove the hair from the eye. She was placed on several medications but she did not bring today. She knows that one was Systane, another Acular, and possibly an antibiotic drop. She has continued with eye pain since then. She denies any acuity changes. She denies any severe mucus or drainage at this time. She denies any fever or chills. She states the headache has gotten worse around the left thigh and into the temple region. The pain is bad enough that she has not been eating or drinking anything for a couple of days. She has been vomiting. She has been able to keep some tea down. Her husband is with her in the room. She notes that she does have a lot of allergies to medications. I have discussed the possibility of using some prednisone for the inflammation. She does not want to take this because she reacted to it. All of her conditions and medications are reviewed today.   Relevant past medical, surgical, family and social history reviewed and updated as indicated. Interim medical history since our last visit reviewed. Allergies and medications reviewed and updated. DATA REVIEWED: CHART IN EPIC  Review of Systems  Constitutional: Positive for fatigue. Negative for activity change and fever.  HENT: Negative.   Eyes: Positive for photophobia and pain. Negative for discharge,  redness, itching and visual disturbance.  Respiratory: Negative.  Negative for cough.   Cardiovascular: Negative.  Negative for chest pain.  Gastrointestinal: Positive for nausea and vomiting. Negative for abdominal pain, constipation and diarrhea.  Endocrine: Negative.   Genitourinary: Negative.  Negative for dysuria.  Musculoskeletal: Negative.   Skin: Negative.   Neurological: Negative.     Per HPI unless specifically indicated above    Medication List       Accurate as of 11/22/15  2:05 PM. Always use your most recent med list.          albuterol 108 (90 Base) MCG/ACT inhaler Commonly known as:  PROVENTIL HFA;VENTOLIN HFA Inhale 2 puffs into the lungs every 4 (four) hours as needed for wheezing or shortness of breath.   ALPRAZolam 0.5 MG tablet Commonly known as:  XANAX 1-2 tablets twice a day as needed   aspirin 81 MG tablet Take 81 mg by mouth daily.   cetirizine 10 MG tablet Commonly known as:  ZYRTEC Take 10 mg by mouth daily. As needed   cyanocobalamin 1000 MCG/ML injection Commonly known as:  (VITAMIN B-12) 1 ml IM monthly   fluticasone 50 MCG/ACT nasal spray Commonly known as:  FLONASE Place 2 sprays into both nostrils daily.   ipratropium-albuterol 0.5-2.5 (3) MG/3ML Soln Commonly known as:  DUONEB Take 3 mL by nebulization every 6 (six) hours as needed.  levothyroxine 112 MCG tablet Commonly known as:  SYNTHROID, LEVOTHROID TAKE 1 TABLET (112 MCG TOTAL) BY MOUTH DAILY.   metFORMIN 500 MG tablet Commonly known as:  GLUCOPHAGE Take 1 tablet (500 mg total) by mouth 2 (two) times daily with a meal.   metoprolol succinate 25 MG 24 hr tablet Commonly known as:  TOPROL-XL TAKE 1 TABLET (25 MG TOTAL) BY MOUTH DAILY.   PARoxetine 20 MG tablet Commonly known as:  PAXIL TAKE 1 TABLET (20 MG TOTAL) BY MOUTH DAILY.   promethazine 12.5 MG tablet Commonly known as:  PHENERGAN Take 1-2 tablets (12.5-25 mg total) by mouth every 6 (six) hours as needed  for nausea or vomiting.          Objective:    BP 138/72 (BP Location: Left Arm, Patient Position: Sitting, Cuff Size: Large)   Pulse (!) 58   Temp 97.4 F (36.3 C) (Oral)   Ht 5\' 6"  (1.676 m)   Wt 172 lb 12.8 oz (78.4 kg)   BMI 27.89 kg/m   Allergies  Allergen Reactions  . Bee Venom Anaphylaxis  . Codeine Itching  . Lipitor [Atorvastatin]     Extreme Dry Mouth  . Penicillins Hives  . Morphine And Related Rash  . Tetracyclines & Related Rash    Wt Readings from Last 3 Encounters:  11/22/15 172 lb 12.8 oz (78.4 kg)  08/22/15 182 lb 9.6 oz (82.8 kg)  08/14/15 179 lb (81.2 kg)    Physical Exam  Constitutional: She is oriented to person, place, and time. She appears well-developed and well-nourished.  HENT:  Head: Normocephalic and atraumatic.  Eyes: EOM are normal. Pupils are equal, round, and reactive to light. Lids are everted and swept, no foreign bodies found. Right eye exhibits no discharge. Left eye exhibits no discharge, no exudate and no hordeolum. No foreign body present in the left eye. Left conjunctiva is injected. Left conjunctiva has no hemorrhage. No scleral icterus. Left eye exhibits normal extraocular motion and no nystagmus. Left pupil is reactive.  Swelling of the periorbital tissues  Neck: Normal range of motion. Neck supple.  Cardiovascular: Normal rate, regular rhythm, normal heart sounds and intact distal pulses.   Pulmonary/Chest: Effort normal and breath sounds normal.  Abdominal: Soft. Bowel sounds are normal.  Neurological: She is alert and oriented to person, place, and time. She has normal reflexes.  Skin: Skin is warm and dry. No rash noted.  Psychiatric: She has a normal mood and affect. Her behavior is normal. Judgment and thought content normal.  Nursing note and vitals reviewed.       Assessment & Plan:   1. Allergic reaction, sequela - CBC with Differential/Platelet  2. Nausea and vomiting, intractability of vomiting not specified,  unspecified vomiting type Stop ALL eye medications - CBC with Differential/Platelet - promethazine (PHENERGAN) 12.5 MG tablet; Take 1-2 tablets (12.5-25 mg total) by mouth every 6 (six) hours as needed for nausea or vomiting.  Dispense: 30 tablet; Refill: 0 - promethazine (PHENERGAN) injection 25 mg; Inject 1 mL (25 mg total) into the muscle once.  3. Headache in front of head - CBC with Differential/Platelet  4. Eye pain, left   Continue all other maintenance medications as listed above.  Follow up plan: Return in about 4 days (around 11/26/2015).   Orders Placed This Encounter  Procedures  . CBC with Differential/Platelet    Educational handout given for allergic reaction  Terald Sleeper PA-C [facility] [address]  [place], [place] [address] [phone]   11/22/2015, 2:06 PM

## 2015-11-23 ENCOUNTER — Telehealth: Payer: Self-pay

## 2015-11-23 LAB — CBC WITH DIFFERENTIAL/PLATELET
BASOS ABS: 0.1 10*3/uL (ref 0.0–0.2)
Basos: 1 %
EOS (ABSOLUTE): 0.5 10*3/uL — AB (ref 0.0–0.4)
Eos: 5 %
HEMOGLOBIN: 14.8 g/dL (ref 11.1–15.9)
Hematocrit: 46.7 % — ABNORMAL HIGH (ref 34.0–46.6)
Immature Grans (Abs): 0 10*3/uL (ref 0.0–0.1)
Immature Granulocytes: 0 %
LYMPHS ABS: 1.6 10*3/uL (ref 0.7–3.1)
Lymphs: 15 %
MCH: 23.3 pg — AB (ref 26.6–33.0)
MCHC: 31.7 g/dL (ref 31.5–35.7)
MCV: 73 fL — ABNORMAL LOW (ref 79–97)
MONOS ABS: 1 10*3/uL — AB (ref 0.1–0.9)
Monocytes: 9 %
NEUTROS ABS: 7.3 10*3/uL — AB (ref 1.4–7.0)
Neutrophils: 70 %
PLATELETS: 598 10*3/uL — AB (ref 150–379)
RBC: 6.36 x10E6/uL — ABNORMAL HIGH (ref 3.77–5.28)
RDW: 18.7 % — ABNORMAL HIGH (ref 12.3–15.4)
WBC: 10.5 10*3/uL (ref 3.4–10.8)

## 2015-11-23 NOTE — Telephone Encounter (Signed)
Per Glenard Haring, I contacted the patient to see how she is feeling today.  No answer, left message on voicemail to return our call.

## 2015-11-26 ENCOUNTER — Encounter: Payer: Self-pay | Admitting: Physician Assistant

## 2015-11-26 ENCOUNTER — Ambulatory Visit: Payer: Medicare Other | Admitting: Physician Assistant

## 2015-11-26 ENCOUNTER — Ambulatory Visit (INDEPENDENT_AMBULATORY_CARE_PROVIDER_SITE_OTHER): Payer: Medicare Other | Admitting: Physician Assistant

## 2015-11-26 VITALS — BP 130/76 | HR 88 | Temp 97.0°F | Ht 66.0 in | Wt 172.0 lb

## 2015-11-26 DIAGNOSIS — K219 Gastro-esophageal reflux disease without esophagitis: Secondary | ICD-10-CM

## 2015-11-26 DIAGNOSIS — R140 Abdominal distension (gaseous): Secondary | ICD-10-CM

## 2015-11-26 MED ORDER — RANITIDINE HCL 150 MG PO TABS: 150.0000 mg | ORAL_TABLET | Freq: Two times a day (BID) | ORAL | 2 refills | Status: DC

## 2015-11-26 NOTE — Progress Notes (Signed)
BP 130/76 (BP Location: Left Arm, Patient Position: Sitting, Cuff Size: Large)   Pulse 88   Temp 97 F (36.1 C) (Oral)   Ht 5\' 6"  (1.676 m)   Wt 172 lb (78 kg)   BMI 27.76 kg/m    Subjective:    Patient ID: Jordan [name], female    DOB: Nov 16, 1940, 75 y.o.   MRN: [id number]  HPI: Jordan [name] is a 75 y.o. female presenting on 11/26/2015 for Follow-up (4 day recheck. Patient states the nausea and diarrhea is not any better. Patient did state that her eye is no longer hurting. ) Her Left eye is feeling much better since stopping the eyedrops. She has continued however with some nausea with occasional vomiting. She did episode of reflux last week that was different for her. Her stools today were light in color. Her urine was also white in color. She has an extensive amount of bloating throughout the day. She is trying to keep fluids and regularly. She has not been able to keep much food down. She is primarily eating crackers and potatoes. Last colonoscopy at [facility] and was completely normal.  She has never had GERD symptoms over the years but last week had reflux into the esophagus that she was able to taste in the back of her throat. She does not recall if she was laying down and in regards to what she had eaten.  Relevant past medical, surgical, family and social history reviewed and updated as indicated. Interim medical history since our last visit reviewed. Allergies and medications reviewed and updated. DATA REVIEWED: CHART IN EPIC  Review of Systems  Constitutional: Positive for fatigue. Negative for activity change and fever.  HENT: Negative.   Eyes: Negative.   Respiratory: Negative.  Negative for cough.   Cardiovascular: Negative.  Negative for chest pain.  Gastrointestinal: Positive for abdominal distention, abdominal pain, nausea and vomiting. Negative for anal bleeding, blood in stool, constipation, diarrhea and rectal pain.  Endocrine: Negative.   Genitourinary: Negative.   Negative for dysuria.  Musculoskeletal: Negative.   Skin: Negative.   Neurological: Negative.     Per HPI unless specifically indicated above    Medication List       Accurate as of 11/26/15  9:58 AM. Always use your most recent med list.          albuterol 108 (90 Base) MCG/ACT inhaler Commonly known as:  PROVENTIL HFA;VENTOLIN HFA Inhale 2 puffs into the lungs every 4 (four) hours as needed for wheezing or shortness of breath.   ALPRAZolam 0.5 MG tablet Commonly known as:  XANAX 1-2 tablets twice a day as needed   aspirin 81 MG tablet Take 81 mg by mouth daily.   cetirizine 10 MG tablet Commonly known as:  ZYRTEC Take 10 mg by mouth daily. As needed   cyanocobalamin 1000 MCG/ML injection Commonly known as:  (VITAMIN B-12) 1 ml IM monthly   fluticasone 50 MCG/ACT nasal spray Commonly known as:  FLONASE Place 2 sprays into both nostrils daily.   ipratropium-albuterol 0.5-2.5 (3) MG/3ML Soln Commonly known as:  DUONEB Take 3 mL by nebulization every 6 (six) hours as needed.   levothyroxine 112 MCG tablet Commonly known as:  SYNTHROID, LEVOTHROID TAKE 1 TABLET (112 MCG TOTAL) BY MOUTH DAILY.   metFORMIN 500 MG tablet Commonly known as:  GLUCOPHAGE Take 1 tablet (500 mg total) by mouth 2 (two) times daily with a meal.   metoprolol succinate 25 MG 24 hr tablet Commonly  known as:  TOPROL-XL TAKE 1 TABLET (25 MG TOTAL) BY MOUTH DAILY.   PARoxetine 20 MG tablet Commonly known as:  PAXIL TAKE 1 TABLET (20 MG TOTAL) BY MOUTH DAILY.   promethazine 12.5 MG tablet Commonly known as:  PHENERGAN Take 1-2 tablets (12.5-25 mg total) by mouth every 6 (six) hours as needed for nausea or vomiting.   ranitidine 150 MG tablet Commonly known as:  ZANTAC Take 1 tablet (150 mg total) by mouth 2 (two) times daily.          Objective:    BP 130/76 (BP Location: Left Arm, Patient Position: Sitting, Cuff Size: Large)   Pulse 88   Temp 97 F (36.1 C) (Oral)   Ht 5\' 6"   (1.676 m)   Wt 172 lb (78 kg)   BMI 27.76 kg/m   Allergies  Allergen Reactions  . Bee Venom Anaphylaxis  . Codeine Itching  . Lipitor [Atorvastatin]     Extreme Dry Mouth  . Penicillins Hives  . Morphine And Related Rash  . Tetracyclines & Related Rash    Wt Readings from Last 3 Encounters:  11/26/15 172 lb (78 kg)  11/22/15 172 lb 12.8 oz (78.4 kg)  08/22/15 182 lb 9.6 oz (82.8 kg)    Physical Exam  Constitutional: She is oriented to person, place, and time. She appears well-developed and well-nourished.  HENT:  Head: Normocephalic and atraumatic.  Eyes: Conjunctivae and EOM are normal. Pupils are equal, round, and reactive to light.  Cardiovascular: Normal rate, regular rhythm, normal heart sounds and intact distal pulses.   Pulmonary/Chest: Effort normal and breath sounds normal.  Abdominal: Soft. She exhibits distension. She exhibits no mass. Bowel sounds are decreased. There is tenderness in the epigastric area. There is no rigidity, no rebound, no guarding, no CVA tenderness, no tenderness at McBurney's point and negative Murphy's sign.  Neurological: She is alert and oriented to person, place, and time. She has normal reflexes.  Skin: Skin is warm and dry. No rash noted.  Psychiatric: She has a normal mood and affect. Her behavior is normal. Judgment and thought content normal.  Nursing note and vitals reviewed.   Results for orders placed or performed in visit on 11/22/15  CBC with Differential/Platelet  Result Value Ref Range   WBC 10.5 3.4 - 10.8 x10E3/uL   RBC 6.36 (H) 3.77 - 5.28 x10E6/uL   Hemoglobin 14.8 11.1 - 15.9 g/dL   Hematocrit 46.7 (H) 34.0 - 46.6 %   MCV 73 (L) 79 - 97 fL   MCH 23.3 (L) 26.6 - 33.0 pg   MCHC 31.7 31.5 - 35.7 g/dL   RDW 18.7 (H) 12.3 - 15.4 %   Platelets 598 (H) 150 - 379 x10E3/uL   Neutrophils 70 %   Lymphs 15 %   Monocytes 9 %   Eos 5 %   Basos 1 %   Neutrophils Absolute 7.3 (H) 1.4 - 7.0 x10E3/uL   Lymphocytes Absolute 1.6  0.7 - 3.1 x10E3/uL   Monocytes Absolute 1.0 (H) 0.1 - 0.9 x10E3/uL   EOS (ABSOLUTE) 0.5 (H) 0.0 - 0.4 x10E3/uL   Basophils Absolute 0.1 0.0 - 0.2 x10E3/uL   Immature Granulocytes 0 %   Immature Grans (Abs) 0.0 0.0 - 0.1 x10E3/uL      Assessment & Plan:   1. Gastroesophageal reflux disease without esophagitis - ranitidine (ZANTAC) 150 MG tablet; Take 1 tablet (150 mg total) by mouth 2 (two) times daily.  Dispense: 60 tablet; Refill: 2 -  Ambulatory referral to Gastroenterology  2. Abdominal bloating  Continue all other maintenance medications as listed above.  Follow up plan: Return if symptoms worsen or fail to improve.   Orders Placed This Encounter  Procedures  . Ambulatory referral to Gastroenterology    Educational handout given for GERD diet  Terald Sleeper PA-C [facility] [address]  [place], [place] [address] [phone]   11/26/2015, 9:58 AM

## 2015-11-26 NOTE — Patient Instructions (Signed)
Food Choices for Gastroesophageal Reflux Disease, Adult When you have gastroesophageal reflux disease (GERD), the foods you eat and your eating habits are very important. Choosing the right foods can help ease the discomfort of GERD. WHAT GENERAL GUIDELINES DO I NEED TO FOLLOW?  Choose fruits, vegetables, whole grains, low-fat dairy products, and low-fat meat, fish, and poultry.  Limit fats such as oils, salad dressings, butter, nuts, and avocado.  Keep a food diary to identify foods that cause symptoms.  Avoid foods that cause reflux. These may be different for different people.  Eat frequent small meals instead of three large meals each day.  Eat your meals slowly, in a relaxed setting.  Limit fried foods.  Cook foods using methods other than frying.  Avoid drinking alcohol.  Avoid drinking large amounts of liquids with your meals.  Avoid bending over or lying down until 2-3 hours after eating. WHAT FOODS ARE NOT RECOMMENDED? The following are some foods and drinks that may worsen your symptoms: Vegetables Tomatoes. Tomato juice. Tomato and spaghetti sauce. Chili peppers. Onion and garlic. Horseradish. Fruits Oranges, grapefruit, and lemon (fruit and juice). Meats High-fat meats, fish, and poultry. This includes hot dogs, ribs, ham, sausage, salami, and bacon. Dairy Whole milk and chocolate milk. Sour cream. Cream. Butter. Ice cream. Cream cheese.  Beverages Coffee and tea, with or without caffeine. Carbonated beverages or energy drinks. Condiments Hot sauce. Barbecue sauce.  Sweets/Desserts Chocolate and cocoa. Donuts. Peppermint and spearmint. Fats and Oils High-fat foods, including French fries and potato chips. Other Vinegar. Strong spices, such as black pepper, white pepper, red pepper, cayenne, curry powder, cloves, ginger, and chili powder. The items listed above may not be a complete list of foods and beverages to avoid. Contact your dietitian for more  information.   This information is not intended to replace advice given to you by your health care provider. Make sure you discuss any questions you have with your health care provider.   Document Released: 02/24/2005 Document Revised: 03/17/2014 Document Reviewed: 12/29/2012 Elsevier Interactive Patient Education 2016 [employer]  

## 2015-11-27 ENCOUNTER — Telehealth: Payer: Self-pay | Admitting: *Deleted

## 2015-11-27 ENCOUNTER — Ambulatory Visit: Payer: Medicare Other | Admitting: Family Medicine

## 2015-11-27 NOTE — Telephone Encounter (Signed)
Lm for patient to see how she was feeling today and to also verify her eye dr's name.

## 2015-11-28 ENCOUNTER — Encounter: Payer: Self-pay | Admitting: Family Medicine

## 2015-11-29 ENCOUNTER — Encounter: Payer: Self-pay | Admitting: Gastroenterology

## 2015-12-10 ENCOUNTER — Other Ambulatory Visit (INDEPENDENT_AMBULATORY_CARE_PROVIDER_SITE_OTHER): Payer: Medicare Other

## 2015-12-10 ENCOUNTER — Encounter: Payer: Self-pay | Admitting: Gastroenterology

## 2015-12-10 ENCOUNTER — Ambulatory Visit (INDEPENDENT_AMBULATORY_CARE_PROVIDER_SITE_OTHER): Payer: Medicare Other | Admitting: Gastroenterology

## 2015-12-10 ENCOUNTER — Other Ambulatory Visit: Payer: Self-pay

## 2015-12-10 VITALS — BP 111/62 | HR 59 | Temp 98.2°F | Ht 66.0 in | Wt 171.2 lb

## 2015-12-10 DIAGNOSIS — D696 Thrombocytopenia, unspecified: Secondary | ICD-10-CM | POA: Diagnosis not present

## 2015-12-10 DIAGNOSIS — R490 Dysphonia: Secondary | ICD-10-CM | POA: Diagnosis not present

## 2015-12-10 DIAGNOSIS — K219 Gastro-esophageal reflux disease without esophagitis: Secondary | ICD-10-CM

## 2015-12-10 DIAGNOSIS — R197 Diarrhea, unspecified: Secondary | ICD-10-CM | POA: Diagnosis not present

## 2015-12-10 DIAGNOSIS — J329 Chronic sinusitis, unspecified: Secondary | ICD-10-CM

## 2015-12-10 DIAGNOSIS — D473 Essential (hemorrhagic) thrombocythemia: Secondary | ICD-10-CM

## 2015-12-10 DIAGNOSIS — D75839 Thrombocytosis, unspecified: Secondary | ICD-10-CM | POA: Insufficient documentation

## 2015-12-10 NOTE — Progress Notes (Addendum)
REVIEWED-NO ADDITIONAL RECOMMENDATIONS.  Primary Care Physician:  Wardell Honour, MD  Primary Gastroenterologist:  Barney Drain, MD   Chief Complaint  Patient presents with  . Follow-up    reflux    HPI:  Jordan [name] is a 75 y.o. female here for further evaluation of possible reflux at the request of her PCP. Patient states she's never had issues with acid reflux. Recently she went to the eye doctor for routine visit and she had a hair removed from the back of her eye. She was placed on 2-3 different eyedrops at the time. She started having burning sensation behind her eyes, headache, vomiting. She notes that she is not one that easily throws up. Now she is having a lot of burning in the throat area. Denies swallowing concerns. She reports she does not typically have headaches but has had headache constantly since her eye issues. Now present for approximally 6 weeks. Reports memory issues for the past one year which has been addressed by her PCP. She has a family history of strokes. Her head CT was normal in October 2016. She does note that she had an episode of tingling in her left leg, right sided facial pain and ultimately her left arm went "dead". It was felt to be related to a stroke. This was while she was living in [facility]. No persistent symptoms. With her recent illness she also had diarrhea for 2 weeks. She reports losing 1214 pounds. States her bowel function is mostly back to normal. No blood in the stool. No recent oral antibiotics.   She states she's had issues with sinusitis but has not been given antibiotics. This is been going on for couple of months. Complains of frontal headaches, burning in the nose. Continues to have green discharge but not as bad as before.  Recently placed on ranitidine for heartburn which is helped some. She is no longer vomiting. Complains of hoarseness. Heavy smoker in the past but has quit. Continues to have diminished appetite, a lot of bloating  with meals and upper abdominal discomfort.     Current Outpatient Prescriptions  Medication Sig Dispense Refill  . aspirin 81 MG tablet Take 81 mg by mouth daily.    . cetirizine (ZYRTEC) 10 MG tablet Take 10 mg by mouth daily. As needed    . cyanocobalamin (,VITAMIN B-12,) 1000 MCG/ML injection 1 ml IM monthly 30 mL 1  . fluticasone (FLONASE) 50 MCG/ACT nasal spray Place 2 sprays into both nostrils daily. 16 g 6  . ipratropium-albuterol (DUONEB) 0.5-2.5 (3) MG/3ML SOLN Take 3 mL by nebulization every 6 (six) hours as needed. 270 mL prn  . levothyroxine (SYNTHROID, LEVOTHROID) 112 MCG tablet TAKE 1 TABLET (112 MCG TOTAL) BY MOUTH DAILY. 90 tablet 3  . metFORMIN (GLUCOPHAGE) 500 MG tablet Take 1 tablet (500 mg total) by mouth 2 (two) times daily with a meal. 60 tablet 0  . metoprolol succinate (TOPROL-XL) 25 MG 24 hr tablet TAKE 1 TABLET (25 MG TOTAL) BY MOUTH DAILY. 90 tablet 2  . PARoxetine (PAXIL) 20 MG tablet TAKE 1 TABLET (20 MG TOTAL) BY MOUTH DAILY. 30 tablet 2  . promethazine (PHENERGAN) 12.5 MG tablet Take 1-2 tablets (12.5-25 mg total) by mouth every 6 (six) hours as needed for nausea or vomiting. 30 tablet 0  . ranitidine (ZANTAC) 150 MG tablet Take 1 tablet (150 mg total) by mouth 2 (two) times daily. 60 tablet 2  . albuterol (PROVENTIL HFA;VENTOLIN HFA) 108 (90 Base) MCG/ACT inhaler  Inhale 2 puffs into the lungs every 4 (four) hours as needed for wheezing or shortness of breath. 1 Inhaler 3  . ALPRAZolam (XANAX) 0.5 MG tablet 1-2 tablets twice a day as needed 60 tablet 2   No current facility-administered medications for this visit.     Allergies as of 12/10/2015 - Review Complete 12/10/2015  Allergen Reaction Noted  . Bee venom Anaphylaxis 10/18/2012  . Codeine Itching 10/04/2012  . Lipitor [atorvastatin]  10/06/2012  . Penicillins Hives 10/04/2012  . Morphine and related Rash 10/04/2012  . Tetracyclines & related Rash 10/04/2012    Past Medical History:  Diagnosis Date   . Anxiety   . Asthma   . Cataract   . Cataracts, both eyes 05/2014  . COPD (chronic obstructive pulmonary disease) ([place])   . Depression   . Diabetes mellitus without complication ([place])   . Hyperlipidemia   . Hypertension   . Stroke ([place])   . Thyroid disease     Past Surgical History:  Procedure Laterality Date  . ABDOMINAL HYSTERECTOMY    . ANKLE SURGERY Right   . BREAST SURGERY     Bx, none malignant  . COLONOSCOPY  2013   normal per patient  . ROTATOR CUFF REPAIR Right   . Skin Cancers    . TONSILLECTOMY      Family History  Problem Relation Age of Onset  . Alzheimer's disease Mother   . Arthritis Father   . Stroke Maternal Aunt     Social History   Social History  . Marital status: Married    Spouse name: N/A  . Number of children: N/A  . Years of education: N/A   Occupational History  . Not on file.   Social History Main Topics  . Smoking status: Former Smoker    Types: Cigarettes    Quit date: 03/10/2000  . Smokeless tobacco: Never Used  . Alcohol use No  . Drug use: No  . Sexual activity: No   Other Topics Concern  . Not on file   Social History Narrative  . No narrative on file      ROS:  General: Negative  , fever, chills, fatigue, weakness. See hpi Eyes: Negative for vision changes.  ENT: Negative for hoarseness, difficulty swallowing , nasal congestion. CV: Negative for chest pain, angina, palpitations, dyspnea on exertion, peripheral edema.  Respiratory: Negative for dyspnea at rest, dyspnea on exertion, cough, sputum, wheezing.  GI: See history of present illness. GU:  Negative for dysuria, hematuria, urinary incontinence, urinary frequency, nocturnal urination.  MS: Negative for joint pain, low back pain.  Derm: Negative for rash or itching.  Neuro: Negative for weakness, abnormal sensation, seizure,   confusion. See hpi Psych: Negative for anxiety, depression, suicidal ideation, hallucinations.  Endo: see hpi  Heme: Negative for  bruising or bleeding. Allergy: Negative for rash or hives.    Physical Examination:  BP 111/62   Pulse (!) 59   Temp 98.2 F (36.8 C) (Oral)   Ht 5\' 6"  (1.676 m)   Wt 171 lb 3.2 oz (77.7 kg)   BMI 27.63 kg/m    General: Well-nourished, well-developed in no acute distress. Accompanied by husband Head: Normocephalic, atraumatic.   Eyes: Conjunctiva pink, no icterus. Mouth: Oropharyngeal mucosa moist and pink , no lesions erythema or exudate. No frontal sinus tenderness with percussion. Neck: Supple without thyromegaly, masses, or lymphadenopathy.  Lungs: Clear to auscultation bilaterally.  Heart: Regular rate and rhythm, no murmurs rubs or gallops.  Abdomen: Bowel sounds are normal, epig tenderness, nondistended, no hepatosplenomegaly or masses, no abdominal bruits or    hernia , no rebound or guarding.   Rectal: not performed Extremities: No lower extremity edema. No clubbing or deformities.  Neuro: Alert and oriented x 4 , grossly normal neurologically.  Skin: Warm and dry, no rash or jaundice.   Psych: Alert and cooperative, normal mood and affect.  Labs: Lab Results  Component Value Date   WBC 10.5 11/22/2015   HGB  14.8 11/22/2015   HCT 46.7 (H) 11/22/2015   MCV 73 (L) 11/22/2015   PLT 598 (H) 11/22/2015   Lab Results  Component Value Date   CREATININE 0.97 08/14/2015   BUN 11 08/14/2015   NA 139 08/14/2015   K 5.3 (H) 08/14/2015   CL 96 08/14/2015   CO2 23 08/14/2015   Lab Results  Component Value Date   ALT 13 04/17/2015   AST 15 04/17/2015   ALKPHOS 74 04/17/2015   BILITOT 0.6 04/17/2015   No results found for: IRON, TIBC, FERRITIN  Lab Results  Component Value Date   TSH 1.010 04/17/2015     Imaging Studies: No results found.

## 2015-12-10 NOTE — Patient Instructions (Addendum)
1. Please have your labs done.  2. We did not have samples of probiotics today. I recommend eating Dannon yogurt twice daily for two weeks or purchase one of the following over the counter. [employer] OR Align. 3. We will contact you with further information once your results are back.

## 2015-12-11 LAB — IRON AND TIBC
Iron Saturation: 17 % (ref 15–55)
Iron: 63 ug/dL (ref 27–139)
Total Iron Binding Capacity: 364 ug/dL (ref 250–450)
UIBC: 301 ug/dL (ref 118–369)

## 2015-12-11 LAB — SEDIMENTATION RATE: Sed Rate: 2 mm/hr (ref 0–40)

## 2015-12-11 LAB — FERRITIN: FERRITIN: 15 ng/mL (ref 15–150)

## 2015-12-12 ENCOUNTER — Encounter: Payer: Self-pay | Admitting: Gastroenterology

## 2015-12-12 ENCOUNTER — Other Ambulatory Visit: Payer: Self-pay

## 2015-12-12 DIAGNOSIS — D473 Essential (hemorrhagic) thrombocythemia: Secondary | ICD-10-CM

## 2015-12-12 DIAGNOSIS — D75839 Thrombocytosis, unspecified: Secondary | ICD-10-CM

## 2015-12-12 NOTE — Progress Notes (Signed)
cc'ed to pcp °

## 2015-12-12 NOTE — Assessment & Plan Note (Signed)
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 Will check sed rate. May require hematology referral. Further recommendations to follow.

## 2015-12-12 NOTE — Progress Notes (Signed)
Pt is aware of the plan. OK to refer to Hematology and OK to schedule the upper endoscopy after 12/31/2015 ( her husband is leaving for a hunting trip and will be back then).   She did not want to switch her Zantac at this time.   She is aware to see PCP for the sinus, headaches and memory issues.

## 2015-12-12 NOTE — Assessment & Plan Note (Signed)
Recent onset GERD-like symptoms. She's been having frequent heartburn, burning in the back for throat in the setting of recent vomiting/diarrhea/headaches. Symptoms all began after sinusitis followed by eye issues as outlined above. It was suspected that she had some sort of allergic reaction to her eyedrops when she presented with periorbital edema to her PCP. At this point her vomiting has resolved. She continues to have significant headaches, diminished appetite, upper abd discomfort, heartburn. She is on Zantac. No prior upper endoscopy.  Of note she has chronic thrombocytosis which needs further evaluation as outlined.  We'll check labs initially. She may require hematology referral for chronic thrombocytosis. I discussed possibility of upper endoscopy for new onset heartburn, anorexia, upper abdominal tenderness and patient is agreeable. Await labs prior to scheduling.  I have discussed the risks, alternatives, benefits with regards to but not limited to the risk of reaction to medication, bleeding, infection, perforation and the patient is agreeable to proceed. Written consent to be obtained.

## 2015-12-12 NOTE — Progress Notes (Signed)
Hematology referral entered in EPIC. LMOM to return call to scheduled EGD.

## 2015-12-12 NOTE — Progress Notes (Signed)
Please let patient know her labs do not point to any significant underlying inflammation that would explain chronically elevated platelet count. ----->Therefore, I would like for her to be seen by a hematologist for chronic thrombocytosis.   Please ask patient if she is willing to switch her zantac to something stronger for heartburn and upper abd issues. --->would recommend pantoprazole 40mg  once daily. #30, 3 refills.  Please offer her an upper endoscopy with SL[facility] to evaluate upper gi symptoms. Phenergan 12.5 mg to be given 30 minutes before procedure. Dx: new onset upper abd pain, vomiting, heartburn  Please ask her to follow up with PCP for sinus issues, headaches, memory concerns.

## 2015-12-13 ENCOUNTER — Telehealth: Payer: Self-pay

## 2015-12-13 ENCOUNTER — Other Ambulatory Visit: Payer: Self-pay

## 2015-12-13 DIAGNOSIS — R112 Nausea with vomiting, unspecified: Secondary | ICD-10-CM

## 2015-12-13 DIAGNOSIS — R1010 Upper abdominal pain, unspecified: Secondary | ICD-10-CM

## 2015-12-13 DIAGNOSIS — R12 Heartburn: Secondary | ICD-10-CM

## 2015-12-13 NOTE — Telephone Encounter (Signed)
Patient needs to sign a release of medical information for [place] to discuss her health issues with her daughter.

## 2015-12-13 NOTE — Telephone Encounter (Signed)
Pt's daughter has called several times today wanting to speak with provider or nurse about the patient's medication change. I told her the nurse was triaging a patient at the moment and would have to call her back. [id number]

## 2015-12-13 NOTE — Telephone Encounter (Signed)
Called Jordan [name]. Informed that EGD had been set-up for 01/14/16 at 8:30am, arrive at 7:30am. Also, gave instructions to her. Advised her mom was aware and instructions were mailed.

## 2015-12-13 NOTE — Telephone Encounter (Signed)
I lm for the daughter to call me so I can make her aware she will need to have her mother add her to the Designated Party release, prior to [place] giving her medical information

## 2015-12-13 NOTE — Telephone Encounter (Signed)
Noted  

## 2015-12-13 NOTE — Progress Notes (Signed)
Called pt and set-up EGD for 01/14/16 at 8:30am. Instructions given to pt and also mailed instructions.

## 2015-12-13 NOTE — Telephone Encounter (Signed)
Spoke with pt's daughter, [name]. She is concerned about her mom and said her mom was concerned about what I had told her yesterday about the Protonix.  I told her that her mom chose not to take the Protonix at this time.  She is aware the referral will be made to Hematology and pt will be scheduled for the EGD.  She said can we please call her at [phone] or [phone] and let her know.  Her mom and step dad live with her and she is trying to help them with their medical appts, etc.

## 2015-12-13 NOTE — Telephone Encounter (Signed)
Patient's daughter called in stating she will have her mother sign a release to speak with [place]

## 2015-12-24 ENCOUNTER — Ambulatory Visit: Payer: Medicare Other | Admitting: Gastroenterology

## 2015-12-25 ENCOUNTER — Ambulatory Visit (HOSPITAL_COMMUNITY): Payer: Medicare Other | Admitting: Hematology & Oncology

## 2016-01-01 ENCOUNTER — Ambulatory Visit (HOSPITAL_COMMUNITY): Payer: Medicare Other | Admitting: Oncology

## 2016-01-11 ENCOUNTER — Encounter (HOSPITAL_COMMUNITY): Payer: Medicare Other

## 2016-01-11 ENCOUNTER — Encounter (HOSPITAL_COMMUNITY): Payer: Self-pay | Admitting: Hematology & Oncology

## 2016-01-11 ENCOUNTER — Other Ambulatory Visit (HOSPITAL_COMMUNITY): Payer: Self-pay | Admitting: Hematology & Oncology

## 2016-01-11 ENCOUNTER — Encounter (HOSPITAL_COMMUNITY): Payer: Medicare Other | Attending: Hematology & Oncology | Admitting: Hematology & Oncology

## 2016-01-11 ENCOUNTER — Encounter (HOSPITAL_COMMUNITY): Payer: Self-pay | Admitting: Lab

## 2016-01-11 VITALS — BP 136/39 | HR 59 | Temp 97.8°F | Resp 16 | Ht 65.0 in | Wt 174.5 lb

## 2016-01-11 DIAGNOSIS — D473 Essential (hemorrhagic) thrombocythemia: Secondary | ICD-10-CM | POA: Diagnosis not present

## 2016-01-11 DIAGNOSIS — E611 Iron deficiency: Secondary | ICD-10-CM

## 2016-01-11 DIAGNOSIS — Z87891 Personal history of nicotine dependence: Secondary | ICD-10-CM | POA: Diagnosis not present

## 2016-01-11 DIAGNOSIS — J449 Chronic obstructive pulmonary disease, unspecified: Secondary | ICD-10-CM

## 2016-01-11 DIAGNOSIS — Z8673 Personal history of transient ischemic attack (TIA), and cerebral infarction without residual deficits: Secondary | ICD-10-CM

## 2016-01-11 DIAGNOSIS — H579 Unspecified disorder of eye and adnexa: Secondary | ICD-10-CM | POA: Diagnosis not present

## 2016-01-11 DIAGNOSIS — H029 Unspecified disorder of eyelid: Secondary | ICD-10-CM

## 2016-01-11 DIAGNOSIS — D75839 Thrombocytosis, unspecified: Secondary | ICD-10-CM

## 2016-01-11 LAB — CBC WITH DIFFERENTIAL/PLATELET
BASOS ABS: 0.2 10*3/uL — AB (ref 0.0–0.1)
Basophils Relative: 2 %
EOS PCT: 9 %
Eosinophils Absolute: 1.1 10*3/uL — ABNORMAL HIGH (ref 0.0–0.7)
HCT: 47.5 % — ABNORMAL HIGH (ref 36.0–46.0)
Hemoglobin: 14.6 g/dL (ref 12.0–15.0)
LYMPHS PCT: 19 %
Lymphs Abs: 2.1 10*3/uL (ref 0.7–4.0)
MCH: 23.6 pg — ABNORMAL LOW (ref 26.0–34.0)
MCHC: 30.7 g/dL (ref 30.0–36.0)
MCV: 76.9 fL — AB (ref 78.0–100.0)
Monocytes Absolute: 0.9 10*3/uL (ref 0.1–1.0)
Monocytes Relative: 8 %
Neutro Abs: 7.1 10*3/uL (ref 1.7–7.7)
Neutrophils Relative %: 62 %
PLATELETS: 880 10*3/uL — AB (ref 150–400)
RBC: 6.18 MIL/uL — AB (ref 3.87–5.11)
RDW: 20.7 % — ABNORMAL HIGH (ref 11.5–15.5)
WBC: 11.3 10*3/uL — AB (ref 4.0–10.5)

## 2016-01-11 LAB — COMPREHENSIVE METABOLIC PANEL
ALBUMIN: 4.6 g/dL (ref 3.5–5.0)
ALT: 9 U/L — AB (ref 14–54)
AST: 19 U/L (ref 15–41)
Alkaline Phosphatase: 65 U/L (ref 38–126)
Anion gap: 7 (ref 5–15)
BUN: 14 mg/dL (ref 6–20)
CHLORIDE: 103 mmol/L (ref 101–111)
CO2: 29 mmol/L (ref 22–32)
CREATININE: 0.85 mg/dL (ref 0.44–1.00)
Calcium: 9.3 mg/dL (ref 8.9–10.3)
GFR calc Af Amer: >60 mL/min (ref >60)
GLUCOSE: 95 mg/dL (ref 65–99)
Potassium: 4.5 mmol/L (ref 3.5–5.1)
SODIUM: 139 mmol/L (ref 135–145)
Total Bilirubin: 0.7 mg/dL (ref 0.3–1.2)
Total Protein: 7.5 g/dL (ref 6.5–8.1)

## 2016-01-11 LAB — FERRITIN: Ferritin: 11 ng/mL (ref 11–307)

## 2016-01-11 LAB — IRON AND TIBC
Iron: 72 ug/dL (ref 28–170)
SATURATION RATIOS: 16 % (ref 10.4–31.8)
TIBC: 448 ug/dL (ref 250–450)
UIBC: 376 ug/dL

## 2016-01-11 LAB — VITAMIN B12: Vitamin B-12: 908 pg/mL (ref 180–914)

## 2016-01-11 NOTE — Progress Notes (Unsigned)
Referral to Dr [facility] in [place], appt / at 11/20 @1130 . Seeing [place] PA.  Patient aware of appt.

## 2016-01-11 NOTE — Patient Instructions (Addendum)
[place] at [facility] Discharge Instructions  RECOMMENDATIONS MADE BY THE CONSULTANT AND ANY TEST RESULTS WILL BE SENT TO YOUR REFERRING PHYSICIAN.  You saw Dr.Penland today. You are being referred to Dr. Danella Penton, dermatologist, for the lesion under your left eye. Labs today. Follow up in 2-2 1/2 weeks. See Amy at checkout for appointments.  Thank you for choosing [place] at [facility] to provide your oncology and hematology care.  To afford each patient quality time with our provider, please arrive at least 15 minutes before your scheduled appointment time.   Beginning January 23rd 2017 lab work for the [facility] will be done in the  Main lab at [facility] on 1st floor. If you have a lab appointment with the [facility] please come in thru the  Main Entrance and check in at the main information desk  You need to re-schedule your appointment should you arrive 10 or more minutes late.  We strive to give you quality time with our providers, and arriving late affects you and other patients whose appointments are after yours.  Also, if you no show three or more times for appointments you may be dismissed from the clinic at the providers discretion.     Again, thank you for choosing [facility].  Our hope is that these requests will decrease the amount of time that you wait before being seen by our physicians.       _____________________________________________________________  Should you have questions after your visit to [facility], please contact our office at [phone] between the hours of 8:30 a.m. and 4:30 p.m.  Voicemails left after 4:30 p.m. will not be returned until the following business day.  For prescription refill requests, have your pharmacy contact our office.         Resources For Cancer Patients and their Caregivers ? American Cancer Society: Can assist with transportation, wigs,  general needs, runs Look Good Feel Better.        231-788-6843 ? Cancer Care: Provides financial assistance, online support groups, medication/co-pay assistance.  1-800-813-HOPE 220-175-0164) ? [place] Assists [place] cancer patients and their families through emotional , educational and financial support.  203-272-7965 ? [place] DSS Where to apply for food stamps, Medicaid and utility assistance. 6415998742 ? [facility]: Transportation to medical appointments. 208-401-6030 ? Social Security Administration: May apply for disability if have a Stage IV cancer. 714-599-8448 (705)336-9062 ? [employer], Disability and Transit Services: Assists with nutrition, care and transit needs. [place] Support Programs: @10RELATIVEDAYS @ > Cancer Support Group  2nd Tuesday of the month 1pm-2pm, Journey Room  > Creative Journey  3rd Tuesday of the month 1130am-1pm, Journey Room  > Look Good Feel Better  1st Wednesday of the month 10am-12 noon, Journey Room (Call [place] to register [phone])

## 2016-01-11 NOTE — Progress Notes (Signed)
[name] NOTE  Patient Care Team: Jordan Honour, MD as PCP - General (Family Medicine) Jordan Binder, MD as Consulting Physician (Gastroenterology)  CHIEF COMPLAINTS/PURPOSE OF CONSULTATION:  Chronic thrombocytosis  HISTORY OF PRESENTING ILLNESS:  Jordan [name] 75 y.o. female is here because of a referral from Jordan [name] at [facility] for chronic thrombocytosis.   Results for KAYAL, Kortne (MRN [id number])   Ref. Range 01/17/2014 14:55 12/18/2014 09:49 08/14/2015 15:27 11/22/2015 11:23  Platelets Latest Ref Range: 150 - 379 x10E3/uL 630 (H) 725 (H) 691 (H) 598 (H)    Jordan [name] is here today accompanied by her husband, Jordan [name]. She presents in a wheel chair, but also uses a cane at home for ambulation.   Jordan [name] believes her mother may have had elevated platelets. The patient personally recalls being iron deficient during pregnancy. She states she is intolerant of oral iron, it caused vomiting.   I have reviewed the labs with the patient.   She has an EGD scheduled for Monday.  She states that sometimes she'll walk into a room and forget what she was walking in there for, so she can't work or drive.   Her appetite has low. She doesn't want food when she first wakes up. She first eats around noon. She has lost weight but she says that she "needed to".   She states that she has been told that her potassium is high.   Her breathing is "not good". She has COPD. She says that she gets a lot of wheezing, which she uses an inhaler for.   She has been experiencing some dizziness.  She states that her "voice is going" within the past 6-7 months. She was a smoker.  She denies any nosebleeds or easy bruising.   MEDICAL HISTORY:  Past Medical History:  Diagnosis Date  . Anxiety   . Asthma   . Cataract   . Cataracts, both eyes 05/2014  . COPD (chronic obstructive pulmonary disease) ([place])   . Depression   . Diabetes mellitus without complication ([place])   .  Hyperlipidemia   . Hypertension   . Stroke ([place])   . Thyroid disease     SURGICAL HISTORY: Past Surgical History:  Procedure Laterality Date  . ABDOMINAL HYSTERECTOMY    . ANKLE SURGERY Right   . BREAST SURGERY     Bx, none malignant  . COLONOSCOPY  2013   normal per patient  . ROTATOR CUFF REPAIR Right   . Skin Cancers    . TONSILLECTOMY      SOCIAL HISTORY: Social History   Social History  . Marital status: Married    Spouse name: N/A  . Number of children: N/A  . Years of education: N/A   Occupational History  . Not on file.   Social History Main Topics  . Smoking status: Former Smoker    Types: Cigarettes    Quit date: 03/10/2000  . Smokeless tobacco: Never Used  . Alcohol use No  . Drug use: No  . Sexual activity: No   Other Topics Concern  . Not on file   Social History Narrative  . 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 Alzheimer's disease Mother   . Arthritis Father   . Stroke Maternal Aunt    Mom passed in mid 69s from alzheimers and anorexia. She had thrombocytosis.  Dad passed in mid 51s from heart failure. He had arthritis and gout (couldn't walk)  ALLERGIES:  is allergic to bee venom; codeine; lipitor [atorvastatin]; penicillins; morphine and related; and tetracyclines & related.  MEDICATIONS:  Current Outpatient Prescriptions  Medication Sig Dispense Refill  . aspirin 81 MG tablet Take 81 mg by mouth daily.    . cyanocobalamin (,VITAMIN B-12,) 1000 MCG/ML injection 1 ml IM monthly 30 mL 1  . gabapentin (NEURONTIN) 300 MG capsule Take 1 capsule by mouth at bedtime.  3  . levothyroxine (SYNTHROID,  LEVOTHROID) 112 MCG tablet TAKE 1 TABLET (112 MCG TOTAL) BY MOUTH DAILY. 90 tablet 3  . metFORMIN (GLUCOPHAGE) 500 MG tablet Take 1 tablet (500 mg total) by mouth 2 (two) times daily with a meal. 60 tablet 0  . metoprolol succinate (TOPROL-XL) 25 MG 24 hr tablet TAKE 1 TABLET (25 MG TOTAL) BY MOUTH DAILY. 90 tablet 2  . Multiple Vitamins-Minerals (ICAPS AREDS 2 PO) Take 1 capsule by mouth 2 (two) times daily.    Marland Kitchen PARoxetine (PAXIL) 20 MG tablet TAKE 1 TABLET (20 MG TOTAL) BY MOUTH DAILY. 30 tablet 2  . ranitidine (ZANTAC) 150 MG tablet Take 1 tablet (150 mg total) by mouth 2 (two) times daily. 60 tablet 2  . albuterol (PROVENTIL HFA;VENTOLIN HFA) 108 (90 Base) MCG/ACT inhaler Inhale 2 puffs into the lungs every 4 (four) hours as needed for wheezing or shortness of breath. (Patient not taking: Reported on 01/11/2016) 1 Inhaler 3  . ALPRAZolam (XANAX) 0.5 MG tablet 1-2 tablets twice a day as needed (Patient not taking: Reported on 01/11/2016) 60 tablet 2  . cetirizine (ZYRTEC) 10 MG tablet Take 10 mg by mouth daily. As needed    . fluticasone (FLONASE) 50 MCG/ACT nasal spray Place 2 sprays into both nostrils daily. (Patient not taking: Reported on 01/11/2016) 16 g 6  . ipratropium-albuterol (DUONEB) 0.5-2.5 (3) MG/3ML SOLN Take 3 mL by nebulization every 6 (six) hours as needed. (Patient not taking: Reported on 01/11/2016) 270 mL prn  . promethazine (PHENERGAN) 12.5 MG tablet Take 1-2 tablets (12.5-25 mg total) by mouth every 6 (six) hours as needed for nausea or vomiting. (Patient not taking: Reported on 01/11/2016) 30 tablet 0   No current facility-administered medications for this visit.     Review of Systems  Constitutional: Negative.   HENT: Negative.        Hoarseness  Eyes: Negative.   Respiratory: Positive for shortness of breath and wheezing.   Cardiovascular: Negative.   Gastrointestinal: Negative.   Genitourinary: Negative.   Musculoskeletal: Negative.   Skin: Negative.        Lesion  under the left eye  Neurological: Positive for dizziness.  Endo/Heme/Allergies: Negative.   Psychiatric/Behavioral: Negative.   All other systems reviewed and are negative. 14 point ROS was done and is otherwise as detailed above or in HPI   PHYSICAL EXAMINATION: ECOG PERFORMANCE STATUS: 2 - Symptomatic, <50% confined to bed  Vitals:   01/11/16 1506  BP: (!) 136/39  Pulse: (!) 59  Resp: 16  Temp: 97.8 F (36.6 C)   Filed Weights   01/11/16 1506  Weight: 174 lb 8 oz (79.2 kg)     Physical Exam  Constitutional: She  is oriented to person, place, and time and well-developed, well-nourished, and in no distress.  She is in a wheel chair, but is able to get on the exam table with  assistance and a cane.   HENT:  Head: Normocephalic and atraumatic.  Mouth/Throat: No oropharyngeal exudate.  Eyes: EOM are normal. Pupils are equal, round, and reactive to light. No scleral icterus.  Lesion under left eye  Neck: Normal range of motion. Neck supple. No thyromegaly present.  Cardiovascular: Normal rate, regular rhythm and normal heart sounds.   Pulmonary/Chest: Effort normal. She has wheezes.  Abdominal: Soft. Bowel sounds are normal. She exhibits no distension. There is no tenderness. There is no rebound and no guarding.  Musculoskeletal: Normal range of motion. She exhibits no tenderness.  Lymphadenopathy:    She has no cervical adenopathy.  Neurological: She is alert and oriented to person, place, and time. No cranial nerve deficit.  Skin: Skin is warm and dry.  Lesion under left eye  Psychiatric: Mood, affect and judgment normal.  Nursing note and vitals reviewed.   LABORATORY DATA:  I have reviewed the data as listed Lab Results  Component Value Date   WBC 10.5 11/22/2015   HGB 14.3 12/18/2014   HCT 46.7 (H) 11/22/2015   MCV 73 (L) 11/22/2015   PLT 598 (H) 11/22/2015   CMP     Component Value Date/Time   NA 139 08/14/2015 1527   K 5.3 (H) 08/14/2015 1527   CL 96  08/14/2015 1527   CO2 23 08/14/2015 1527   GLUCOSE 98 08/14/2015 1527   GLUCOSE 150 (H) 12/18/2014 0949   BUN 11 08/14/2015 1527   CREATININE 0.97 08/14/2015 1527   CALCIUM 9.8 08/14/2015 1527   PROT 6.9 04/17/2015 0916   ALBUMIN 4.5 04/17/2015 0916   AST 15 04/17/2015 0916   ALT 13 04/17/2015 0916   ALKPHOS 74 04/17/2015 0916   BILITOT 0.6 04/17/2015 0916   GFRNONAA 57 (L) 08/14/2015 1527   GFRAA 66 08/14/2015 1527     ASSESSMENT & PLAN:  Thrombocytosis ([place]) Iron deficiency Hx CVA  We discussed and reviewed her blood work. Labs are noted above and under HPI. We reviewed the potential causes of thrombocytosis including iron deficiency and MPD. She is agreeable to proceeding with an evaluation today.   I have written a referral for dermatology for a lesion under the left eye.   She will return for a follow up in 2 weeks to review lab results. If MPD evaluation returns positive will start her on hydrea with additional teaching at follow-up.  ORDERS PLACED FOR THIS ENCOUNTER: No orders of the defined types were placed in this encounter.  Orders Placed This Encounter  Procedures  . CBC with Differential    Standing Status:   Future    Number of Occurrences:   1    Standing Expiration Date:   01/10/2017  . Comprehensive metabolic panel    Standing Status:   Future    Number of Occurrences:   1    Standing Expiration Date:   01/10/2017  . Vitamin B12    Standing Status:   Future    Number of Occurrences:   1    Standing Expiration Date:   01/10/2017  . Ferritin    Standing Status:   Future    Number of Occurrences:   1    Standing Expiration Date:   01/10/2017  . Iron and TIBC    Standing Status:   Future  Number of Occurrences:   1    Standing Expiration Date:   01/10/2017  . JAK2 V617F, w Reflex to CALR/E12/MPL  . BCR-ABL1, CML/ALL, PCR, QUANT  . Ambulatory referral to Social Work    Referral Priority:   Routine    Referral Type:   Consultation    Referral Reason:    Specialty Services Required    Number of Visits Requested:   1    This document serves as a record of services personally performed by Ancil Linsey, MD. It was created on her behalf by Martinique Casey, a trained medical scribe. The creation of this record is based on the scribe's personal observations and the provider's statements to them. This document has been checked and approved by the attending provider.  I have reviewed the above documentation for accuracy and completeness and I agree with the above.  This note was electronically signed.    Molli Hazard, MD  01/11/2016 3:13 PM

## 2016-01-14 ENCOUNTER — Ambulatory Visit (HOSPITAL_COMMUNITY)
Admission: RE | Admit: 2016-01-14 | Discharge: 2016-01-14 | Disposition: A | Discharge status: Home / Self Care | Payer: Medicare Other | Source: Ambulatory Visit | Attending: Gastroenterology | Admitting: Gastroenterology

## 2016-01-14 ENCOUNTER — Encounter (HOSPITAL_COMMUNITY)
Admission: RE | Disposition: A | Discharge status: Home / Self Care | Payer: Self-pay | Source: Ambulatory Visit | Attending: Gastroenterology

## 2016-01-14 ENCOUNTER — Encounter (HOSPITAL_COMMUNITY): Payer: Self-pay | Admitting: *Deleted

## 2016-01-14 ENCOUNTER — Other Ambulatory Visit: Payer: Self-pay | Admitting: Family Medicine

## 2016-01-14 DIAGNOSIS — J449 Chronic obstructive pulmonary disease, unspecified: Secondary | ICD-10-CM | POA: Diagnosis not present

## 2016-01-14 DIAGNOSIS — K317 Polyp of stomach and duodenum: Secondary | ICD-10-CM | POA: Insufficient documentation

## 2016-01-14 DIAGNOSIS — Z8673 Personal history of transient ischemic attack (TIA), and cerebral infarction without residual deficits: Secondary | ICD-10-CM | POA: Insufficient documentation

## 2016-01-14 DIAGNOSIS — M1990 Unspecified osteoarthritis, unspecified site: Secondary | ICD-10-CM | POA: Diagnosis not present

## 2016-01-14 DIAGNOSIS — E119 Type 2 diabetes mellitus without complications: Secondary | ICD-10-CM | POA: Diagnosis not present

## 2016-01-14 DIAGNOSIS — F419 Anxiety disorder, unspecified: Secondary | ICD-10-CM | POA: Insufficient documentation

## 2016-01-14 DIAGNOSIS — I10 Essential (primary) hypertension: Secondary | ICD-10-CM | POA: Insufficient documentation

## 2016-01-14 DIAGNOSIS — E785 Hyperlipidemia, unspecified: Secondary | ICD-10-CM | POA: Insufficient documentation

## 2016-01-14 DIAGNOSIS — Z87891 Personal history of nicotine dependence: Secondary | ICD-10-CM | POA: Diagnosis not present

## 2016-01-14 DIAGNOSIS — Z880 Allergy status to penicillin: Secondary | ICD-10-CM | POA: Diagnosis not present

## 2016-01-14 DIAGNOSIS — Z7982 Long term (current) use of aspirin: Secondary | ICD-10-CM | POA: Diagnosis not present

## 2016-01-14 DIAGNOSIS — K219 Gastro-esophageal reflux disease without esophagitis: Secondary | ICD-10-CM | POA: Insufficient documentation

## 2016-01-14 DIAGNOSIS — Z8261 Family history of arthritis: Secondary | ICD-10-CM | POA: Diagnosis not present

## 2016-01-14 DIAGNOSIS — R1010 Upper abdominal pain, unspecified: Secondary | ICD-10-CM

## 2016-01-14 DIAGNOSIS — R1013 Epigastric pain: Secondary | ICD-10-CM | POA: Diagnosis present

## 2016-01-14 DIAGNOSIS — Z7984 Long term (current) use of oral hypoglycemic drugs: Secondary | ICD-10-CM | POA: Diagnosis not present

## 2016-01-14 DIAGNOSIS — R112 Nausea with vomiting, unspecified: Secondary | ICD-10-CM

## 2016-01-14 DIAGNOSIS — F329 Major depressive disorder, single episode, unspecified: Secondary | ICD-10-CM | POA: Diagnosis not present

## 2016-01-14 DIAGNOSIS — Z881 Allergy status to other antibiotic agents status: Secondary | ICD-10-CM | POA: Diagnosis not present

## 2016-01-14 DIAGNOSIS — Z888 Allergy status to other drugs, medicaments and biological substances status: Secondary | ICD-10-CM | POA: Diagnosis not present

## 2016-01-14 DIAGNOSIS — Z823 Family history of stroke: Secondary | ICD-10-CM | POA: Insufficient documentation

## 2016-01-14 DIAGNOSIS — Z885 Allergy status to narcotic agent status: Secondary | ICD-10-CM | POA: Diagnosis not present

## 2016-01-14 DIAGNOSIS — K2960 Other gastritis without bleeding: Secondary | ICD-10-CM | POA: Insufficient documentation

## 2016-01-14 DIAGNOSIS — K259 Gastric ulcer, unspecified as acute or chronic, without hemorrhage or perforation: Secondary | ICD-10-CM | POA: Diagnosis not present

## 2016-01-14 DIAGNOSIS — Z91030 Bee allergy status: Secondary | ICD-10-CM | POA: Insufficient documentation

## 2016-01-14 DIAGNOSIS — R12 Heartburn: Secondary | ICD-10-CM

## 2016-01-14 DIAGNOSIS — Z85828 Personal history of other malignant neoplasm of skin: Secondary | ICD-10-CM | POA: Diagnosis not present

## 2016-01-14 HISTORY — PX: ESOPHAGOGASTRODUODENOSCOPY: SHX5428

## 2016-01-14 HISTORY — DX: Unspecified convulsions: R56.9

## 2016-01-14 HISTORY — DX: Malignant (primary) neoplasm, unspecified: C80.1

## 2016-01-14 HISTORY — DX: Gastro-esophageal reflux disease without esophagitis: K21.9

## 2016-01-14 HISTORY — DX: Unspecified osteoarthritis, unspecified site: M19.90

## 2016-01-14 LAB — GLUCOSE, CAPILLARY: GLUCOSE-CAPILLARY: 98 mg/dL (ref 65–99)

## 2016-01-14 SURGERY — EGD (ESOPHAGOGASTRODUODENOSCOPY)
Anesthesia: Moderate Sedation

## 2016-01-14 MED ORDER — LIDOCAINE VISCOUS 2 % MT SOLN
OROMUCOSAL | Status: AC
  Filled 2016-01-14: qty 15

## 2016-01-14 MED ORDER — SIMETHICONE 40 MG/0.6ML PO SUSP
ORAL | Status: DC | PRN
  Administered 2016-01-14: 09:00:00

## 2016-01-14 MED ORDER — SODIUM CHLORIDE 0.9 % IV SOLN
INTRAVENOUS | Status: DC
  Administered 2016-01-14: 1000 mL via INTRAVENOUS

## 2016-01-14 MED ORDER — MEPERIDINE HCL 100 MG/ML IJ SOLN
INTRAMUSCULAR | Status: AC
  Filled 2016-01-14: qty 2

## 2016-01-14 MED ORDER — OMEPRAZOLE 20 MG PO CPDR: DELAYED_RELEASE_CAPSULE | ORAL | 11 refills | Status: DC

## 2016-01-14 MED ORDER — LIDOCAINE VISCOUS 2 % MT SOLN
OROMUCOSAL | Status: DC | PRN
  Administered 2016-01-14: 1 via OROMUCOSAL

## 2016-01-14 MED ORDER — SODIUM CHLORIDE 0.9% FLUSH
INTRAVENOUS | Status: AC
  Filled 2016-01-14: qty 10

## 2016-01-14 MED ORDER — PROMETHAZINE HCL 25 MG/ML IJ SOLN
INTRAMUSCULAR | Status: AC
  Filled 2016-01-14: qty 1

## 2016-01-14 MED ORDER — MEPERIDINE HCL 100 MG/ML IJ SOLN
INTRAMUSCULAR | Status: DC | PRN
  Administered 2016-01-14 (×2): 25 mg via INTRAVENOUS

## 2016-01-14 MED ORDER — MIDAZOLAM HCL 5 MG/5ML IJ SOLN
INTRAMUSCULAR | Status: AC
  Filled 2016-01-14: qty 10

## 2016-01-14 MED ORDER — PROMETHAZINE HCL 25 MG/ML IJ SOLN
12.5000 mg | Freq: Once | INTRAMUSCULAR | Status: AC
  Administered 2016-01-14: 12.5 mg via INTRAVENOUS

## 2016-01-14 MED ORDER — MIDAZOLAM HCL 5 MG/5ML IJ SOLN
INTRAMUSCULAR | Status: DC | PRN
  Administered 2016-01-14: 2 mg via INTRAVENOUS
  Administered 2016-01-14: 1 mg via INTRAVENOUS

## 2016-01-14 NOTE — Op Note (Signed)
[facility] Patient Name: Jordan [name] Procedure Date: 01/14/2016 8:14 AM MRN: [id number] Date of Birth: 1940/03/15 Attending MD: Barney Drain , MD CSN: [id number] Age: 75 Admit Type: Outpatient Procedure:                Upper GI endoscopy WITH COLD FORCEPS BIOPSY AND                            SNARE POLYPECTOMY Indications:              Epigastric abdominal pain, Diarrhea, Nausea with                            vomiting Providers:                Barney Drain, MD, Rosina Lowenstein, RN, Charlyne Petrin RN, RN, Randa Spike, Technician Referring MD:             Lillette Boxer. Miller Medicines:                Promethazine 12.5 mg IV, Meperidine 50 mg IV,                            Midazolam 3 mg IV Complications:            No immediate complications. Estimated Blood Loss:     Estimated blood loss was minimal. Procedure:                Pre-Anesthesia Assessment:                           - Prior to the procedure, a History and Physical                            was performed, and patient medications and                            allergies were reviewed. The patient's tolerance of                            previous anesthesia was also reviewed. The risks                            and benefits of the procedure and the sedation                            options and risks were discussed with the patient.                            All questions were answered, and informed consent                            was obtained. Prior Anticoagulants: The patient has  taken aspirin, last dose was day of procedure. ASA                            Grade Assessment: II - A patient with mild systemic                            disease. After reviewing the risks and benefits,                            the patient was deemed in satisfactory condition to                            undergo the procedure. After obtaining informed     consent, the endoscope was passed under direct                            vision. Throughout the procedure, the patient's                            blood pressure, pulse, and oxygen saturations were                            monitored continuously. The [id number]                            scope was introduced through the mouth, and                            advanced to the second part of duodenum. The upper                            GI endoscopy was accomplished without difficulty.                            The patient tolerated the procedure well. Scope In: 9:00:05 AM Scope Out: 9:16:55 AM Total Procedure Duration: 0 hours 16 minutes 50 seconds  Findings:      The examined esophagus was normal.      A single 10 mm semi-sessile polyp with bleeding and no stigmata of       recent bleeding was found on the greater curvature of the stomach. The       polyp was removed with a hot snare. Resection and retrieval were       complete. To prevent bleeding post-intervention, two hemostatic clips       were successfully placed (MR conditional).      The examined duodenum was normal. Biopsies for histology were taken with       a cold forceps for evaluation of celiac disease.      Patchy mild inflammation characterized by congestion (edema) and       erythema was found in the gastric antrum. Biopsies were taken with a       cold forceps for Helicobacter pylori testing. Impression:               - Normal esophagus.                           -  A single gastric polyp. Resected and retrieved.                            Clips (MR conditional) were placed.                           - Normal examined duodenum. Biopsied.                           - DYSPEPSIA MOST LIKELY DUE TO GERD/GASTRITIS Moderate Sedation:      Moderate (conscious) sedation was administered by the endoscopy nurse       and supervised by the endoscopist. The following parameters were       monitored: oxygen saturation,  heart rate, blood pressure, and response       to care. Total physician intraservice time was 29 minutes. Recommendation:           - Await pathology results.                           - Low fat diet and lactose free diet.                           - Continue present medications.                           - Await pathology results.                           - Return to my office in 3 months.                           - NO MRI FOR 30 DAYS.                           - HOLD ASPIRIN FOR 3 DAYS                           - START OMEPRAZOLE DAILY TO PREVENT ULCERS AND                            TREAT GERD/GASTRITIS                           - Patient has a contact number available for                            emergencies. The signs and symptoms of potential                            delayed complications were discussed with the                            patient. Return to normal activities tomorrow.                            Written discharge instructions were  provided to the                            patient. Procedure Code(s):        --- Professional ---                           [id number], Esophagogastroduodenoscopy, flexible,                            transoral; with removal of tumor(s), polyp(s), or                            other lesion(s) by snare technique                           43239, 59, Esophagogastroduodenoscopy, flexible,                            transoral; with biopsy, single or multiple                           99152, Moderate sedation services provided by the                            same physician or other qualified health care                            professional performing the diagnostic or                            therapeutic service that the sedation supports,                            requiring the presence of an independent trained                            observer to assist in the monitoring of the                            patient's level of  consciousness and physiological                            status; initial 15 minutes of intraservice time,                            patient age 45 years or older                           [phone], Moderate sedation services; each additional                            15 minutes intraservice time Diagnosis Code(s):        --- Professional ---  K31.7, Polyp of stomach and duodenum                           R10.13, Epigastric pain                           R19.7, Diarrhea, unspecified                           R11.2, Nausea with vomiting, unspecified CPT copyright 2016 [employer]. All rights reserved. The codes documented in this report are preliminary and upon coder review may  be revised to meet current compliance requirements. Barney Drain, MD Barney Drain, MD 01/14/2016 9:39:41 AM This report has been signed electronically. Number of Addenda: 0

## 2016-01-14 NOTE — Discharge Instructions (Addendum)
YOUR ABDOMINAL PAIN IS MOST LIKELY DUE TO GASTRITIS AND REFLUX. You have gastritis due to aspirin use. YOU HAD ONE STOMACH POLYP REMOVED. YOU HAD SOME BLEEDING AT THE POLYP BASE. I PLACED TWO CLIPS TO PREVENT BLEEDING.  I biopsied your stomach AND SMALL BOWEL.   NO MRI FOR 30 DAYS DUE TO METAL CLIP PLACEMENT IN THE STOMACH.  HOLD ASPIRIN FOR 3 DAYS.  TAKE OMEPRAZOLE 30 MINUTES PRIOR TO YOUR FIRST MEAL EVERY DAY TO PREVENT ULCERS AND TREAT GASTRITIS.  AVOID TRIGGERS FOR GASTRITIS. SEE INFO BELOW.  FOLLOW A LOW FAT/DIARY FREE DIET. SEE INFO BELOW.  FOLLOW UP IN 3 MOS.  UPPER ENDOSCOPY AFTER CARE Read the instructions outlined below and refer to this sheet in the next week. These discharge instructions provide you with general information on caring for yourself after you leave the hospital. While your treatment has been planned according to the most current medical practices available, unavoidable complications occasionally occur. If you have any problems or questions after discharge, call DR. Zamantha Strebel, [phone].  ACTIVITY  You may resume your regular activity, but move at a slower pace for the next 24 hours.   Take frequent rest periods for the next 24 hours.   Walking will help get rid of the air and reduce the bloated feeling in your belly (abdomen).   No driving for 24 hours (because of the medicine (anesthesia) used during the test).   You may shower.   Do not sign any important legal documents or operate any machinery for 24 hours (because of the anesthesia used during the test).    NUTRITION  Drink plenty of fluids.   You may resume your normal diet as instructed by your doctor.   Begin with a light meal and progress to your normal diet. Heavy or fried foods are harder to digest and may make you feel sick to your stomach (nauseated).   Avoid alcoholic beverages for 24 hours or as instructed.    MEDICATIONS  You may resume your normal medications.   WHAT YOU CAN  EXPECT TODAY  Some feelings of bloating in the abdomen.   Passage of more gas than usual.    IF YOU HAD A BIOPSY TAKEN DURING THE UPPER ENDOSCOPY:  Eat a soft diet IF YOU HAVE NAUSEA, BLOATING, ABDOMINAL PAIN, OR VOMITING.    FINDING OUT THE RESULTS OF YOUR TEST Not all test results are available during your visit. DR. Oneida Alar WILL CALL YOU WITHIN 14 DAYS OF YOUR PROCEDUE WITH YOUR RESULTS. Do not assume everything is normal if you have not heard from DR. Massie Mees, CALL HER OFFICE AT [phone].  SEEK IMMEDIATE MEDICAL ATTENTION AND CALL THE OFFICE: [phone] IF:  You have more than a spotting of blood in your stool.   Your belly is swollen (abdominal distention).   You are nauseated or vomiting.   You have a temperature over 101F.   You have abdominal pain or discomfort that is severe or gets worse throughout the day.   Gastritis  Gastritis is an inflammation (the body's way of reacting to injury and/or infection) of the stomach. It is often caused by viral or bacterial (germ) infections. It can also be caused BY ASPIRIN, BC/GOODY POWDER'S, (IBUPROFEN) MOTRIN, OR ALEVE (NAPROXEN), chemicals (including alcohol), SPICY FOODS, and medications. This illness may be associated with generalized malaise (feeling tired, not well), UPPER ABDOMINAL STOMACH cramps, and fever. One common bacterial cause of gastritis is an organism known as H. Pylori. This can be treated with  antibiotics.    Low-Fat Diet  BREADS, CEREALS, PASTA, RICE, DRIED PEAS, AND BEANS These products are high in carbohydrates and most are low in fat. Therefore, they can be increased in the diet as substitutes for fatty foods. They too, however, contain calories and should not be eaten in excess. Cereals can be eaten for snacks as well as for breakfast.  Include foods that contain fiber (fruits, vegetables, whole grains, and legumes). Research shows that fiber may lower blood cholesterol levels, especially the  water-soluble fiber found in fruits, vegetables, oat products, and legumes.  FRUITS AND VEGETABLES It is good to eat fruits and vegetables. Besides being sources of fiber, both are rich in vitamins and some minerals. They help you get the daily allowances of these nutrients. Fruits and vegetables can be used for snacks and desserts.  MEATS Limit lean meat, chicken, [place], and fish to no more than 6 ounces per day.  Beef, Pork, and Lamb Use lean cuts of beef, pork, and lamb. Lean cuts include:  Extra-lean ground beef.  Arm roast.  Sirloin tip.  Center-cut ham.  Round steak.  Loin chops.  Rump roast.  Tenderloin.  Trim all fat off the outside of meats before cooking. It is not necessary to severely decrease the intake of red meat, but lean choices should be made. Lean meat is rich in protein and contains a highly absorbable form of iron. Premenopausal women, in particular, should avoid reducing lean red meat because this could increase the risk for low red blood cells (iron-deficiency anemia).  Chicken and [place] These are good sources of protein. The fat of poultry can be reduced by removing the skin and underlying fat layers before cooking. Chicken and [place] can be substituted for lean red meat in the diet. Poultry should not be fried or covered with high-fat sauces.  Fish and Shellfish Fish is a good source of protein. Shellfish contain cholesterol, but they usually are low in saturated fatty acids. The preparation of fish is important. Like chicken and [place], they should not be fried or covered with high-fat sauces.  EGGS Egg whites contain no fat or cholesterol. They can be eaten often. Try 1 to 2 egg whites instead of whole eggs in recipes or use egg substitutes that do not contain yolk.  MILK AND DAIRY PRODUCTS Use skim or 1% milk instead of 2% or whole milk. Decrease whole milk, natural, and processed cheeses. Use nonfat or low-fat (2%) cottage cheese or low-fat cheeses made  from vegetable oils. Choose nonfat or low-fat (1 to 2%) yogurt. Experiment with evaporated skim milk in recipes that call for heavy cream. Substitute low-fat yogurt or low-fat cottage cheese for sour cream in dips and salad dressings. Have at least 2 servings of low-fat dairy products, such as 2 glasses of skim (or 1%) milk each day to help get your daily calcium intake.  FATS AND OILS Reduce the total intake of fats, especially saturated fat. Butterfat, lard, and beef fats are high in saturated fat and cholesterol. These should be avoided as much as possible. Vegetable fats do not contain cholesterol, but certain vegetable fats, such as coconut oil, palm oil, and palm kernel oil are very high in saturated fats. These should be limited. These fats are often used in bakery goods, processed foods, popcorn, oils, and nondairy creamers. Vegetable shortenings and some peanut butters contain hydrogenated oils, which are also saturated fats. Read the labels on these foods and check for saturated vegetable oils.  Unsaturated vegetable  oils and fats do not raise blood cholesterol. However, they should be limited because they are fats and are high in calories. Total fat should still be limited to 30% of your daily caloric intake. Desirable liquid vegetable oils are corn oil, cottonseed oil, olive oil, canola oil, safflower oil, soybean oil, and sunflower oil. Peanut oil is not as good, but small amounts are acceptable. Buy a heart-healthy tub margarine that has no partially hydrogenated oils in the ingredients. Mayonnaise and salad dressings often are made from unsaturated fats, but they should also be limited because of their high calorie and fat content. Seeds, nuts, peanut butter, olives, and avocados are high in fat, but the fat is mainly the unsaturated type. These foods should be limited mainly to avoid excess calories and fat.  OTHER EATING TIPS Snacks  Most sweets should be limited as snacks. They tend to be  rich in calories and fats, and their caloric content outweighs their nutritional value. Some good choices in snacks are graham crackers, melba toast, soda crackers, bagels (no egg), English muffins, fruits, and vegetables. These snacks are preferable to snack crackers, [place] fries, and chips. Popcorn should be air-popped or cooked in small amounts of liquid vegetable oil.  Desserts Eat fruit, low-fat yogurt, and fruit ices instead of pastries, cake, and cookies. Sherbet, angel food cake, gelatin dessert, frozen low-fat yogurt, or other frozen products that do not contain saturated fat (pure fruit juice bars, frozen ice pops) are also acceptable.   COOKING METHODS Choose those methods that use little or no fat. They include: Poaching.  Braising.  Steaming.  Grilling.  Baking.  Stir-frying.  Broiling.  Microwaving.  Foods can be cooked in a nonstick pan without added fat, or use a nonfat cooking spray in regular cookware. Limit fried foods and avoid frying in saturated fat. Add moisture to lean meats by using water, broth, cooking wines, and other nonfat or low-fat sauces along with the cooking methods mentioned above. Soups and stews should be chilled after cooking. The fat that forms on top after a few hours in the refrigerator should be skimmed off. When preparing meals, avoid using excess salt. Salt can contribute to raising blood pressure in some people.  EATING AWAY FROM HOME Order entres, potatoes, and vegetables without sauces or butter. When meat exceeds the size of a deck of cards (3 to 4 ounces), the rest can be taken home for another meal. Choose vegetable or fruit salads and ask for low-calorie salad dressings to be served on the side. Use dressings sparingly. Limit high-fat toppings, such as bacon, crumbled eggs, cheese, sunflower seeds, and olives. Ask for heart-healthy tub margarine instead of butter.   Lactose Free Diet Lactose is a carbohydrate that is found mainly in milk  and milk products, as well as in foods with added milk or whey. Lactose must be digested by the enzyme in order to be used by the body. Lactose intolerance occurs when there is a shortage of lactase. When your body is not able to digest lactose, you may feel sick to your stomach (nausea), bloating, cramping, gas and diarrhea.  There are many dairy products that may be tolerated better than milk by some people:  The use of cultured dairy products such as yogurt, buttermilk, cottage cheese, and sweet acidophilus milk (Kefir) for lactase-deficient individuals is usually well tolerated. This is because the healthy bacteria help digest lactose.   Lactose-hydrolyzed milk (Lactaid) contains 40-90% less lactose than milk and may also be well  tolerated.    SPECIAL NOTES  Lactose is a carbohydrates. The major food source is dairy products. Reading food labels is important. Many products contain lactose even when they are not made from milk. Look for the following words: whey, milk solids, dry milk solids, nonfat dry milk powder. Typical sources of lactose other than dairy products include breads, candies, cold cuts, prepared and processed foods, and commercial sauces and gravies.   All foods must be prepared without milk, cream, or other dairy foods.   Soy milk and lactose-free supplements (LACTASE) may be used as an alternative to milk.   FOOD GROUP ALLOWED/RECOMMENDED AVOID/USE SPARINGLY  BREADS / STARCHES 4 servings or more* Breads and rolls made without milk. [place], [place], or [place] bread. Breads and rolls that contain milk. Prepared mixes such as muffins, biscuits, waffles, pancakes. Sweet rolls, donuts, [place] toast (if made with milk or lactose).  Crackers: Soda crackers, graham crackers. Any crackers prepared without lactose. Zwieback crackers, corn curls, or any that contain lactose.  Cereals: Cooked or dry cereals prepared without lactose (read labels). Cooked or dry cereals prepared with  lactose (read labels). Total, Cocoa Krispies. Special K.  Potatoes / Pasta / Rice: Any prepared without milk or lactose. Popcorn. Instant potatoes, frozen [place] fries, scalloped or au gratin potatoes.  VEGETABLES 2 servings or more Fresh, frozen, and canned vegetables. Creamed or breaded vegetables. Vegetables in a cheese sauce or with lactose-containing margarines.  FRUIT 2 servings or more All fresh, canned, or frozen fruits that are not processed with lactose. Any canned or frozen fruits processed with lactose.  MEAT & SUBSTITUTES 2 servings or more (4 to 6 oz. total per day) Plain beef, chicken, fish, [place], lamb, veal, pork, or ham. Kosher prepared meat products. Strained or junior meats that do not contain milk. Eggs, soy meat substitutes, nuts. Scrambled eggs, omelets, and souffles that contain milk. Creamed or breaded meat, fish, or fowl. Sausage products such as wieners, liver sausage, or cold cuts that contain milk solids. Cheese, cottage cheese, or cheese spreads.  MILK None. (See BEVERAGES for milk substitutes. See DESSERTS for ice cream and frozen desserts.) Milk (whole, 2%, skim, or chocolate). Evaporated, powdered, or condensed milk; malted milk.  SOUPS & COMBINATION FOODS Bouillon, broth, vegetable soups, clear soups, consomms. Homemade soups made with allowed ingredients. Combination or prepared foods that do not contain milk or milk products (read labels). Cream soups, chowders, commercially prepared soups containing lactose. Macaroni and cheese, pizza. Combination or prepared foods that contain milk or milk products.  DESSERTS & SWEETS In moderation Water and fruit ices; gelatin; angel food cake. Homemade cookies, pies, or cakes made from allowed ingredients. Pudding (if made with water or a milk substitute). Lactose-free tofu desserts. Sugar, honey, corn syrup, jam, jelly; marmalade; molasses (beet sugar); Pure sugar candy; marshmallows. Ice cream, ice milk, sherbet,  custard, pudding, frozen yogurt. Commercial cake and cookie mixes. Desserts that contain chocolate. Pie crust made with milk-containing margarine; reduced-calorie desserts made with a sugar substitute that contains lactose. Toffee, peppermint, butterscotch, chocolate, caramels.  FATS & OILS In moderation Butter (as tolerated; contains very small amounts of lactose). Margarines and dressings that do not contain milk, Vegetable oils, shortening, Miracle Whip, mayonnaise, nondairy cream & whipped toppings without lactose or milk solids added (examples: Coffee Rich, Carnation Coffeemate, Rich's Whipped Topping, PolyRich). Berniece Salines. Margarines and salad dressings containing milk; cream, cream cheese; peanut butter with added milk solids, sour cream, chip dips, made with sour cream.  BEVERAGES Carbonated drinks; tea;  coffee and freeze-dried coffee; some instant coffees (check labels). Fruit drinks; fruit and vegetable juice; Rice or Soy milk. Ovaltine, hot chocolate. Some cocoas; some instant coffees; instant iced teas; powdered fruit drinks (read labels).   CONDIMENTS / MISCELLANEOUS Soy sauce, carob powder, olives, gravy made with water, baker's cocoa, pickles, pure seasonings and spices, wine, pure monosodium glutamate, catsup, mustard. Some chewing gums, chocolate, some cocoas. Certain antibiotics and vitamin / mineral preparations. Spice blends if they contain milk products. MSG extender. Artificial sweeteners that contain lactose such as Equal (Nutra-Sweet) and Sweet 'n Low. Some nondairy creamers (read labels).   SAMPLE MENU*  Breakfast   Orange Juice.  Banana.   Bran flakes.   Nondairy Creamer.  [place] Bread (toasted).   Butter or milk-free margarine.   Coffee or tea.    Noon Meal   Chicken Breast.  Rice.   Green beans.   Butter or milk-free margarine.  Fresh melon.   Coffee or tea.    Evening Meal   Roast Beef.  Baked potato.   Butter or milk-free margarine.    Broccoli.   Lettuce salad with vinegar and oil dressing.  [employer]   Coffee or tea.

## 2016-01-14 NOTE — H&P (Signed)
Primary Care Physician:  Wardell Honour, MD Primary Gastroenterologist:  Dr. Oneida Alar  Pre-Procedure History & Physical: HPI:  Jordan [name] is a 75 y.o. female here for  ABDOMINAL PAIN/DYSPEPSIA.  Past Medical History:  Diagnosis Date  . Anxiety   . Arthritis   . Asthma   . Cancer [facility])    Skin cancer  . Cataract   . Cataracts, both eyes 05/2014  . COPD (chronic obstructive pulmonary disease) ([place])   . Depression   . Diabetes mellitus without complication ([place])   . GERD (gastroesophageal reflux disease)   . Hyperlipidemia   . Hypertension   . Seizures ([place])    From RH Negative bvlood when pregnant  . Stroke ([place])   . Thyroid disease     Past Surgical History:  Procedure Laterality Date  . ABDOMINAL HYSTERECTOMY    . ANKLE SURGERY Right   . BREAST SURGERY     Bx, none malignant  . COLONOSCOPY  2013   normal per patient  . ROTATOR CUFF REPAIR Right   . Skin Cancers    . TONSILLECTOMY      Prior to Admission medications   Medication Sig Start Date End Date Taking? Authorizing Provider  albuterol (PROVENTIL HFA;VENTOLIN HFA) 108 (90 Base) MCG/ACT inhaler Inhale 2 puffs into the lungs every 4 (four) hours as needed for wheezing or shortness of breath. 04/17/15  Yes Wardell Honour, MD  aspirin 81 MG tablet Take 81 mg by mouth daily.   Yes Historical Provider, MD  cetirizine (ZYRTEC) 10 MG tablet Take 10 mg by mouth daily. As needed   Yes Historical Provider, MD  cyanocobalamin (,VITAMIN B-12,) 1000 MCG/ML injection 1 ml IM monthly 06/12/15  Yes Wardell Honour, MD  gabapentin (NEURONTIN) 300 MG capsule Take 1 capsule by mouth at bedtime. 12/18/15  Yes Historical Provider, MD  levothyroxine (SYNTHROID, LEVOTHROID) 112 MCG tablet TAKE 1 TABLET (112 MCG TOTAL) BY MOUTH DAILY. 05/30/15  Yes Wardell Honour, MD  metFORMIN (GLUCOPHAGE) 500 MG tablet Take 1 tablet (500 mg total) by mouth 2 (two) times daily with a meal. 12/15/14  Yes Wardell Honour, MD  metoprolol succinate  (TOPROL-XL) 25 MG 24 hr tablet TAKE 1 TABLET (25 MG TOTAL) BY MOUTH DAILY. 12/20/14  Yes Fransisca Kaufmann Dettinger, MD  Multiple Vitamins-Minerals (ICAPS AREDS 2 PO) Take 1 capsule by mouth 2 (two) times daily.   Yes Historical Provider, MD  PARoxetine (PAXIL) 20 MG tablet TAKE 1 TABLET (20 MG TOTAL) BY MOUTH DAILY. 11/20/15  Yes Wardell Honour, MD  promethazine (PHENERGAN) 12.5 MG tablet Take 1-2 tablets (12.5-25 mg total) by mouth every 6 (six) hours as needed for nausea or vomiting. 11/22/15  Yes Terald Sleeper, PA-C  ranitidine (ZANTAC) 150 MG tablet Take 1 tablet (150 mg total) by mouth 2 (two) times daily. 11/26/15  Yes Terald Sleeper, PA-C  ALPRAZolam Duanne Moron) 0.5 MG tablet 1-2 tablets twice a day as needed Patient not taking: Reported on 01/14/2016 04/17/15   Wardell Honour, MD  fluticasone [facility]) 50 MCG/ACT nasal spray Place 2 sprays into both nostrils daily. Patient not taking: Reported on 01/14/2016 06/15/14   Tiffany A Gann, PA-C  ipratropium-albuterol (DUONEB) 0.5-2.5 (3) MG/3ML SOLN Take 3 mL by nebulization every 6 (six) hours as needed. Patient not taking: Reported on 01/14/2016 10/22/12   Deneise Lever, MD    Allergies as of 12/13/2015 - Review Complete 12/10/2015  Allergen Reaction Noted  . Bee venom Anaphylaxis 10/18/2012  .  Codeine Itching 10/04/2012  . Lipitor [atorvastatin]  10/06/2012  . Penicillins Hives 10/04/2012  . Morphine and related Rash 10/04/2012  . Tetracyclines & related Rash 10/04/2012    Family History  Problem Relation Age of Onset  . Alzheimer's disease Mother   . Arthritis Father   . Stroke Maternal Aunt     Social History   Social History  . Marital status: Married    Spouse name: N/A  . Number of children: N/A  . Years of education: N/A   Occupational History  . Not on file.   Social History Main Topics  . Smoking status: Former Smoker    Packs/day: 3.00    Years: 36.00    Types: Cigarettes    Quit date: 03/10/2000  . Smokeless tobacco: Never  Used  . Alcohol use No  . Drug use: No  . Sexual activity: No   Other Topics Concern  . Not on file   Social History Narrative  . No narrative on file    Review of Systems: See HPI, otherwise negative ROS   Physical Exam: BP 123/88   Pulse 60   Temp 97.3 F (36.3 C) (Oral)   Resp 14   SpO2 98%  General:   Alert,  pleasant and cooperative in NAD Head:  Normocephalic and atraumatic. Neck:  Supple; Lungs:  Clear throughout to auscultation.    Heart:  Regular rate and rhythm. Abdomen:  Soft, nontender and nondistended. Normal bowel sounds, without guarding, and without rebound.   Neurologic:  Alert and  oriented x4;  grossly normal neurologically.  Impression/Plan:     DYSPEPSIA  PLAN:  EGD TODA. DISCUSSED PROCEDURE, BENEFITS, & RISKS: < 1% chance of medication reaction, bleeding, or perforation.

## 2016-01-15 ENCOUNTER — Encounter: Payer: Self-pay | Admitting: *Deleted

## 2016-01-15 NOTE — Progress Notes (Signed)
[place] Psychosocial Distress Screening Clinical Social Work  Clinical Social Work was referred by distress screening protocol.  The patient scored a 7 on the Psychosocial Distress Thermometer which indicates severe distress.  Clinical Social Worker reviewed chart to assess for distress and other psychosocial needs. Pt does not have cancer and was screened in error. Pt is currently on medication for her depression.   ONCBCN DISTRESS SCREENING 01/11/2016  Screening Type Initial Screening  Distress experienced in past week (1-10) 7  Emotional problem type Depression  Physical Problem type Pain;Breathing;Talking;Changes in urination;Skin dry/itchy  Physician notified of physical symptoms Yes  Referral to clinical social work Yes    Clinical Social Worker follow up needed: No.  If yes, follow up plan:  Jordan [name], [place] Tuesdays   [phone]

## 2016-01-16 LAB — JAK2 V617F, W REFLEX TO CALR/E12/MPL

## 2016-01-16 LAB — BCR-ABL1, CML/ALL, PCR, QUANT

## 2016-01-18 ENCOUNTER — Other Ambulatory Visit (HOSPITAL_COMMUNITY): Payer: Self-pay | Admitting: *Deleted

## 2016-01-18 ENCOUNTER — Encounter (HOSPITAL_COMMUNITY): Payer: Self-pay | Admitting: Gastroenterology

## 2016-01-18 DIAGNOSIS — D471 Chronic myeloproliferative disease: Secondary | ICD-10-CM

## 2016-01-18 MED ORDER — HYDROXYUREA 500 MG PO CAPS: 500.0000 mg | ORAL_CAPSULE | Freq: Every day | ORAL | 1 refills | Status: DC

## 2016-01-28 DIAGNOSIS — D239 Other benign neoplasm of skin, unspecified: Secondary | ICD-10-CM | POA: Diagnosis not present

## 2016-01-28 DIAGNOSIS — L570 Actinic keratosis: Secondary | ICD-10-CM | POA: Diagnosis not present

## 2016-01-28 DIAGNOSIS — L219 Seborrheic dermatitis, unspecified: Secondary | ICD-10-CM | POA: Diagnosis not present

## 2016-01-29 ENCOUNTER — Telehealth: Payer: Self-pay | Admitting: Gastroenterology

## 2016-01-29 ENCOUNTER — Other Ambulatory Visit (HOSPITAL_COMMUNITY): Payer: Medicare Other

## 2016-01-29 NOTE — Telephone Encounter (Signed)
Please call pt. HER stomach Bx shows gastritis AND A BENIGN STOMACH POLYP.   NO MRI FOR 30 DAYS DUE TO METAL CLIP PLACEMENT IN THE STOMACH.  TAKE OMEPRAZOLE 30 MINUTES PRIOR TO YOUR FIRST MEAL EVERY DAY TO PREVENT ULCERS AND TREAT GASTRITIS.  FOLLOW A LOW FAT/DIARY FREE DIET.   FOLLOW UP IN 3 MOS E30 GASTRITIS/GERD.

## 2016-01-29 NOTE — Telephone Encounter (Signed)
Pt is aware.  

## 2016-01-30 ENCOUNTER — Encounter (HOSPITAL_COMMUNITY): Payer: Self-pay | Admitting: Hematology & Oncology

## 2016-01-30 ENCOUNTER — Encounter (HOSPITAL_COMMUNITY): Payer: Medicare Other

## 2016-01-30 ENCOUNTER — Encounter (HOSPITAL_BASED_OUTPATIENT_CLINIC_OR_DEPARTMENT_OTHER): Payer: Medicare Other | Admitting: Hematology & Oncology

## 2016-01-30 VITALS — BP 161/56 | HR 66 | Temp 97.5°F | Resp 16 | Wt 170.1 lb

## 2016-01-30 DIAGNOSIS — D471 Chronic myeloproliferative disease: Secondary | ICD-10-CM

## 2016-01-30 DIAGNOSIS — E611 Iron deficiency: Secondary | ICD-10-CM

## 2016-01-30 DIAGNOSIS — C946 Myelodysplastic disease, not classified: Secondary | ICD-10-CM | POA: Diagnosis not present

## 2016-01-30 DIAGNOSIS — D75839 Thrombocytosis, unspecified: Secondary | ICD-10-CM

## 2016-01-30 DIAGNOSIS — D473 Essential (hemorrhagic) thrombocythemia: Secondary | ICD-10-CM

## 2016-01-30 DIAGNOSIS — R110 Nausea: Secondary | ICD-10-CM | POA: Diagnosis not present

## 2016-01-30 LAB — COMPREHENSIVE METABOLIC PANEL
ALBUMIN: 4.6 g/dL (ref 3.5–5.0)
ALT: 9 U/L — ABNORMAL LOW (ref 14–54)
ANION GAP: 8 (ref 5–15)
AST: 19 U/L (ref 15–41)
Alkaline Phosphatase: 64 U/L (ref 38–126)
BUN: 16 mg/dL (ref 6–20)
CALCIUM: 9.5 mg/dL (ref 8.9–10.3)
CO2: 28 mmol/L (ref 22–32)
Chloride: 102 mmol/L (ref 101–111)
Creatinine, Ser: 0.9 mg/dL (ref 0.44–1.00)
GFR calc non Af Amer: >60 mL/min (ref >60)
GLUCOSE: 130 mg/dL — AB (ref 65–99)
POTASSIUM: 5 mmol/L (ref 3.5–5.1)
SODIUM: 138 mmol/L (ref 135–145)
Total Bilirubin: 0.7 mg/dL (ref 0.3–1.2)
Total Protein: 7.3 g/dL (ref 6.5–8.1)

## 2016-01-30 LAB — CBC WITH DIFFERENTIAL/PLATELET
BASOS ABS: 0.2 10*3/uL — AB (ref 0.0–0.1)
BASOS PCT: 1 %
Eosinophils Absolute: 0.9 10*3/uL — ABNORMAL HIGH (ref 0.0–0.7)
Eosinophils Relative: 8 %
HEMATOCRIT: 46.1 % — AB (ref 36.0–46.0)
HEMOGLOBIN: 14.3 g/dL (ref 12.0–15.0)
LYMPHS PCT: 20 %
Lymphs Abs: 2.1 10*3/uL (ref 0.7–4.0)
MCH: 24.2 pg — ABNORMAL LOW (ref 26.0–34.0)
MCHC: 31 g/dL (ref 30.0–36.0)
MCV: 78 fL (ref 78.0–100.0)
MONO ABS: 1 10*3/uL (ref 0.1–1.0)
MONOS PCT: 9 %
NEUTROS ABS: 6.6 10*3/uL (ref 1.7–7.7)
NEUTROS PCT: 62 %
Platelets: 709 10*3/uL — ABNORMAL HIGH (ref 150–400)
RBC: 5.91 MIL/uL — ABNORMAL HIGH (ref 3.87–5.11)
RDW: 21.5 % — AB (ref 11.5–15.5)
WBC: 10.7 10*3/uL — ABNORMAL HIGH (ref 4.0–10.5)

## 2016-01-30 NOTE — Progress Notes (Signed)
Jordan [name] NOTE  Patient Care Team: Jordan Honour, MD as PCP - General (Family Medicine) Jordan Binder, MD as Consulting Physician (Gastroenterology)  CHIEF COMPLAINTS/PURPOSE OF CONSULTATION:  Chronic thrombocytosis  HISTORY OF PRESENTING ILLNESS:  Jordan [name] 75 y.o. female is here because of a referral from Jordan [name] at [facility] for chronic thrombocytosis.   Results for Jordan [name] (MRN [id number])   Ref. Range 01/17/2014 14:55 12/18/2014 09:49 08/14/2015 15:27 11/22/2015 11:23  Platelets Latest Ref Range: 150 - 379 x10E3/uL 630 (H) 725 (H) 691 (H) 598 (H)    Ms. Jordan [name] is here today accompanied by her husband, Jordan [name]. She presents in a wheel chair, but also uses a cane at home for ambulation.   Jordan [name] believes her mother may have had elevated platelets. The patient personally recalls being iron deficient during pregnancy. She states she is intolerant of oral iron, it caused vomiting.   I have reviewed the labs with the patient.   Dr. Oneida Alar did patient's upper endoscopy. Patient is unsure if Dr. Oneida Alar will also perform her colonoscopy.  Lexee has started Hydrea. She denies nausea since starting Hydrea. However, she would like to have a prescription for nausea in case she does ever experience it while on Hydrea. She currently denies any bleeding or bruising. 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   MEDICAL HISTORY:  Past Medical History:  Diagnosis Date  . Anxiety   . Arthritis   . Asthma   . Cancer [facility])    Skin cancer  . Cataract   . Cataracts, both eyes 05/2014  . COPD (chronic obstructive pulmonary disease) ([place])   . Depression   . Diabetes mellitus without complication ([place])   . GERD (gastroesophageal reflux disease)   . Hyperlipidemia   . Hypertension   . Seizures ([place])    From RH Negative bvlood when pregnant  . Stroke ([place])   . Thyroid disease     SURGICAL HISTORY: Past Surgical History:  Procedure Laterality  Date  . ABDOMINAL HYSTERECTOMY    . ANKLE SURGERY Right   . BREAST SURGERY     Bx, none malignant  . COLONOSCOPY  2013   normal per patient  . ESOPHAGOGASTRODUODENOSCOPY N/A 01/14/2016   Procedure: ESOPHAGOGASTRODUODENOSCOPY (EGD);  Surgeon: Jordan Binder, MD;  Location: [facility];  Service: Endoscopy;  Laterality: N/A;  8:30 am  . ROTATOR CUFF REPAIR Right   . Skin Cancers    . TONSILLECTOMY      SOCIAL HISTORY: Social History   Social History  . Marital status: Married    Spouse name: N/A  . Number of children: N/A  . Years of education: N/A   Occupational History  . Not on file.   Social History Main Topics  . Smoking status: Former Smoker    Packs/day: 3.00    Years: 36.00    Types: Cigarettes    Quit date: 03/10/2000  . Smokeless tobacco: Never Used  . Alcohol use No  . Drug use: No  . Sexual activity: No   Other Topics Concern  . Not on file   Social History Narrative  . 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 Alzheimer's disease Mother   . Arthritis Father   . Stroke Maternal Aunt    Mom passed in mid 16s from alzheimers and anorexia. She had thrombocytosis.  Dad passed in mid 55s from heart failure. He had arthritis and gout (couldn't walk)  ALLERGIES:  is allergic to bee venom; codeine; lipitor [atorvastatin]; penicillins; morphine and related; and tetracyclines & related.  MEDICATIONS:  Current Outpatient Prescriptions  Medication Sig Dispense Refill  . albuterol (PROVENTIL HFA;VENTOLIN HFA) 108 (90 Base) MCG/ACT inhaler Inhale [name] puffs into the lungs every 4 (four) hours as needed for wheezing or shortness  of breath. 1 Inhaler 3  . ALPRAZolam (XANAX) 0.5 MG tablet 1-[name] tablets twice a day as needed 60 tablet [name]  . betamethasone, augmented, (DIPROLENE) 0.05 % lotion     . cetirizine (ZYRTEC) 10 MG tablet Take 10 mg by mouth daily. As needed    . cyanocobalamin (,VITAMIN B-12,) 1000 MCG/ML injection 1 ml IM monthly 30 mL 1  . fluticasone (FLONASE) 50 MCG/ACT nasal spray Place [name] sprays into both nostrils daily. 16 g 6  . gabapentin (NEURONTIN) 300 MG capsule Take 1 capsule by mouth at bedtime.  3  . gabapentin (NEURONTIN) 300 MG capsule TAKE 1 CAPSULE (300 MG TOTAL) BY MOUTH AT BEDTIME. 30 capsule 1  . hydroxyurea (HYDREA) 500 MG capsule Take 1 capsule (500 mg total) by mouth daily. May take with food to minimize GI side effects. 30 capsule 1  . ipratropium-albuterol (DUONEB) 0.5-[name].5 (3) MG/3ML SOLN Take 3 mL by nebulization every 6 (six) hours as needed. 270 mL prn  . levothyroxine (SYNTHROID, LEVOTHROID) 112 MCG tablet TAKE 1 TABLET (112 MCG TOTAL) BY MOUTH DAILY. 90 tablet 3  . metFORMIN (GLUCOPHAGE) 500 MG tablet Take 1 tablet (500 mg total) by mouth [name] (two) times daily with a meal. 60 tablet 0  . metoprolol succinate (TOPROL-XL) 25 MG 24 hr tablet TAKE 1 TABLET (25 MG TOTAL) BY MOUTH DAILY. 90 tablet [name]  . Multiple Vitamins-Minerals (ICAPS AREDS [name] PO) Take 1 capsule by mouth [name] (two) times daily.    Marland Kitchen omeprazole (PRILOSEC) 20 MG capsule 1 po every morning 30 minutes prior to your first meal. 31 capsule 11  . PARoxetine (PAXIL) 20 MG tablet TAKE 1 TABLET (20 MG TOTAL) BY MOUTH DAILY. 30 tablet [name]  . promethazine (PHENERGAN) 12.5 MG tablet Take 1-[name] tablets (12.5-25 mg total) by mouth every 6 (six) hours as needed for nausea or vomiting. 30 tablet 0  . ranitidine (ZANTAC) 150 MG tablet Take 1 tablet (150 mg total) by mouth [name] (two) times daily. 60 tablet [name]   No current facility-administered medications for this visit.     Review of Systems  Constitutional: Negative.   HENT: Negative.   Eyes:  Negative.   Cardiovascular: Negative.   Gastrointestinal: Negative.   Genitourinary: Negative.   Musculoskeletal: Negative.   Skin: Negative.        Lesion under the left eye  Endo/Heme/Allergies: Negative.   Psychiatric/Behavioral: Negative.   All other systems reviewed and are negative. 14 point ROS was done and is otherwise as detailed above or in HPI   PHYSICAL EXAMINATION: ECOG PERFORMANCE STATUS: 1 - Symptomatic but completely ambulatory  Vitals:   01/30/16 1131  BP: (!) 161/56  Pulse: 66  Resp: 16  Temp: 97.5 F (36.4 C)   Filed Weights   01/30/16 1131  Weight:  170 lb 1.6 oz (77.[name] kg)     Physical Exam  Constitutional: She is oriented to person, place, and time and well-developed, well-nourished, and in no distress.  Patient is able to get on exam table with assistance and a cane.  HENT:  Head: Normocephalic and atraumatic.  Mouth/Throat: No oropharyngeal exudate.  Eyes: EOM are normal. Pupils are equal, round, and reactive to light. No scleral icterus.  Neck: Normal range of motion. Neck supple.  Cardiovascular: Normal rate, regular rhythm and normal heart sounds.   Pulmonary/Chest: Effort normal. She has wheezes.  Abdominal: Soft. Bowel sounds are normal. She exhibits no distension and no mass. There is no tenderness. There is no rebound and no guarding.  Musculoskeletal: Normal range of motion.  Lymphadenopathy:    She has no cervical adenopathy.  Neurological: She is alert and oriented to person, place, and time. Gait normal.  Skin: Skin is warm and dry.  Lesion under left eye  Psychiatric: Mood, memory, affect and judgment normal.  Nursing note and vitals reviewed.   LABORATORY DATA:  I have reviewed the data as listed Lab Results  Component Value Date   WBC 10.7 (H) 01/30/2016   HGB 14.3 01/30/2016   HCT 46.1 (H) 01/30/2016   MCV 78.0 01/30/2016   PLT 709 (H) 01/30/2016   CMP     Component Value Date/Time   NA 138 01/30/2016 0941   NA 139  08/14/2015 1527   K 5.0 01/30/2016 0941   CL 102 01/30/2016 0941   CO2 28 01/30/2016 0941   GLUCOSE 130 (H) 01/30/2016 0941   BUN 16 01/30/2016 0941   BUN 11 08/14/2015 1527   CREATININE 0.90 01/30/2016 0941   CALCIUM 9.5 01/30/2016 0941   PROT 7.3 01/30/2016 0941   PROT 6.9 04/17/2015 0916   ALBUMIN 4.6 01/30/2016 0941   ALBUMIN 4.5 04/17/2015 0916   AST 19 01/30/2016 0941   ALT 9 (L) 01/30/2016 0941   ALKPHOS 64 01/30/2016 0941   BILITOT 0.7 01/30/2016 0941   BILITOT 0.6 04/17/2015 0916   GFRNONAA >60 01/30/2016 0941   GFRAA >60 01/30/2016 0941     RADIOGRAPHIC STUDIES: I have personally reviewed the radiological images as listed and agreed with the findings in the report. No results found.   PATHOLOGY:   ASSESSMENT & PLAN:  No matching staging information was found for the patient. No problem-specific Assessment & Plan notes found for this encounter. Thrombocytosis (HCC) Jordan [name] positive MPD Iron deficiency Hydrea  We discussed MPD, Jordan [name] positivity, Essential thrombocytosis. We reviewed side effects and benefits of hydrea therapy. Ginger has already started hydrea and is doing well. Labs reviewed and noted above   She is iron deficient, I would not replace her iron as I suspect she may become polycythemic as well. I have referred her to GI for her iron deficiency, it may be secondary to her MPD but certainly GI blood loss needs to be ruled out.    Kiahra was given information on myeloproliferative disease from the leukemia and lymphoma society.   I have written her a new schedule for hydrea. She is to take 1000 mg Monday, Wednesday, and Friday. All other days, she is to take 500 mg.   I will give her a lab schedule so she will know when to come in for blood work. She will have CBC and CMP every two weeks. I will tell Amy to set up Kandra's lab appointments as early as possible per patient request.  I  have written her a prescription for nausea.   Follow up with patient the  first week of January.   ORDERS PLACED FOR THIS ENCOUNTER: Orders Placed This Encounter  Procedures  . CBC with Differential    Standing Status:   Standing    Number of Occurrences:   20    Standing Expiration Date:   01/29/2017  . Comprehensive metabolic panel    Standing Status:   Standing    Number of Occurrences:   20    Standing Expiration Date:   01/29/2017    This document serves as a record of services personally performed by Ancil Linsey, MD. It was created on her behalf by Elmyra Ricks, a trained medical scribe. The creation of this record is based on the scribe's personal observations and the provider's statements to them. This document has been checked and approved by the attending provider. . I have reviewed the above documentation for accuracy and completeness and I agree with the above.  This note was electronically signed.    Molli Hazard, MD  01/30/2016 12:32 PM

## 2016-01-30 NOTE — Telephone Encounter (Signed)
PATIENT SCHEDULED FOR OV

## 2016-01-30 NOTE — Patient Instructions (Addendum)
[place] at [facility] Discharge Instructions  RECOMMENDATIONS MADE BY THE CONSULTANT AND ANY TEST RESULTS WILL BE SENT TO YOUR REFERRING PHYSICIAN.  You saw Dr.Penland today.  Come back for standing labs every 2 weeks.  Follow up first week of January.  Take Hydrea 1000 mg on Monday- Wednesday and Friday.  Take Hydrea 500 mg on Tuesday- Thursday- Saturday and Sunday.  See Amy at checkout for appointments.  Thank you for choosing [place] at [facility] to provide your oncology and hematology care.  To afford each patient quality time with our provider, please arrive at least 15 minutes before your scheduled appointment time.   Beginning January 23rd 2017 lab work for the [facility] will be done in the  Main lab at [facility] on 1st floor. If you have a lab appointment with the [facility] please come in thru the  Main Entrance and check in at the main information desk  You need to re-schedule your appointment should you arrive 10 or more minutes late.  We strive to give you quality time with our providers, and arriving late affects you and other patients whose appointments are after yours.  Also, if you no show three or more times for appointments you may be dismissed from the clinic at the providers discretion.     Again, thank you for choosing [facility].  Our hope is that these requests will decrease the amount of time that you wait before being seen by our physicians.       _____________________________________________________________  Should you have questions after your visit to [facility], please contact our office at [phone] between the hours of 8:30 a.m. and 4:30 p.m.  Voicemails left after 4:30 p.m. will not be returned until the following business day.  For prescription refill requests, have your pharmacy contact our office.         Resources For Cancer Patients and their  Caregivers ? American Cancer Society: Can assist with transportation, wigs, general needs, runs Look Good Feel Better.        917-600-5988 ? Cancer Care: Provides financial assistance, online support groups, medication/co-pay assistance.  1-800-813-HOPE (682)199-8411) ? [employer] Assists [place] cancer patients and their families through emotional , educational and financial support.  585-048-8501 ? [place] DSS Where to apply for food stamps, Medicaid and utility assistance. 402 434 1496 ? [facility]: Transportation to medical appointments. 708-436-5533 ? Social Security Administration: May apply for disability if have a Stage IV cancer. 867-054-5951 727-328-4611 ? [employer], Disability and Transit Services: Assists with nutrition, care and transit needs. [place] Support Programs: @10RELATIVEDAYS @ > Cancer Support Group  2nd Tuesday of the month 1pm-2pm, Journey Room  > Creative Journey  3rd Tuesday of the month 1130am-1pm, Journey Room  > Look Good Feel Better  1st Wednesday of the month 10am-12 noon, Journey Room (Call [place] to register [phone])

## 2016-02-07 DIAGNOSIS — L723 Sebaceous cyst: Secondary | ICD-10-CM | POA: Diagnosis not present

## 2016-02-08 ENCOUNTER — Telehealth (HOSPITAL_COMMUNITY): Payer: Self-pay | Admitting: *Deleted

## 2016-02-08 NOTE — Telephone Encounter (Signed)
What do you want me to call in for her?

## 2016-02-11 ENCOUNTER — Other Ambulatory Visit (HOSPITAL_COMMUNITY): Payer: Self-pay | Admitting: Oncology

## 2016-02-11 DIAGNOSIS — D471 Chronic myeloproliferative disease: Secondary | ICD-10-CM

## 2016-02-11 MED ORDER — HYDROXYUREA 500 MG PO CAPS: ORAL_CAPSULE | ORAL | 1 refills | Status: DC

## 2016-02-11 NOTE — Telephone Encounter (Signed)
We do not treat toothaches as we do not know the cause.  She is on a medication called Hydrea.  Her dentist can manage the toothache as deemed appropriate.  TK

## 2016-02-12 ENCOUNTER — Encounter (HOSPITAL_COMMUNITY): Payer: Medicare Other | Attending: Hematology & Oncology

## 2016-02-12 DIAGNOSIS — D471 Chronic myeloproliferative disease: Secondary | ICD-10-CM | POA: Diagnosis not present

## 2016-02-12 LAB — CBC WITH DIFFERENTIAL/PLATELET
BASOS ABS: 0.2 10*3/uL — AB (ref 0.0–0.1)
Basophils Relative: 2 %
EOS ABS: 0.9 10*3/uL — AB (ref 0.0–0.7)
EOS PCT: 11 %
HCT: 47.3 % — ABNORMAL HIGH (ref 36.0–46.0)
Hemoglobin: 14.9 g/dL (ref 12.0–15.0)
LYMPHS PCT: 21 %
Lymphs Abs: 1.8 10*3/uL (ref 0.7–4.0)
MCH: 25.1 pg — ABNORMAL LOW (ref 26.0–34.0)
MCHC: 31.5 g/dL (ref 30.0–36.0)
MCV: 79.6 fL (ref 78.0–100.0)
MONO ABS: 0.7 10*3/uL (ref 0.1–1.0)
Monocytes Relative: 8 %
Neutro Abs: 4.9 10*3/uL (ref 1.7–7.7)
Neutrophils Relative %: 58 %
PLATELETS: 583 10*3/uL — AB (ref 150–400)
RBC: 5.94 MIL/uL — ABNORMAL HIGH (ref 3.87–5.11)
RDW: 23.1 % — AB (ref 11.5–15.5)
WBC: 8.4 10*3/uL (ref 4.0–10.5)

## 2016-02-12 LAB — COMPREHENSIVE METABOLIC PANEL
ALT: 9 U/L — ABNORMAL LOW (ref 14–54)
AST: 16 U/L (ref 15–41)
Albumin: 4.2 g/dL (ref 3.5–5.0)
Alkaline Phosphatase: 62 U/L (ref 38–126)
Anion gap: 8 (ref 5–15)
BUN: 14 mg/dL (ref 6–20)
CHLORIDE: 102 mmol/L (ref 101–111)
CO2: 29 mmol/L (ref 22–32)
Calcium: 9 mg/dL (ref 8.9–10.3)
Creatinine, Ser: 0.8 mg/dL (ref 0.44–1.00)
Glucose, Bld: 128 mg/dL — ABNORMAL HIGH (ref 65–99)
POTASSIUM: 4.5 mmol/L (ref 3.5–5.1)
SODIUM: 139 mmol/L (ref 135–145)
Total Bilirubin: 0.6 mg/dL (ref 0.3–1.2)
Total Protein: 7.1 g/dL (ref 6.5–8.1)

## 2016-02-12 NOTE — Telephone Encounter (Signed)
Called patient who states she was able to get her tooth taken care of.

## 2016-02-17 ENCOUNTER — Encounter (HOSPITAL_COMMUNITY): Payer: Self-pay | Admitting: Hematology & Oncology

## 2016-02-17 ENCOUNTER — Other Ambulatory Visit: Payer: Self-pay | Admitting: Physician Assistant

## 2016-02-17 DIAGNOSIS — K219 Gastro-esophageal reflux disease without esophagitis: Secondary | ICD-10-CM

## 2016-02-26 ENCOUNTER — Encounter (HOSPITAL_COMMUNITY): Payer: Medicare Other

## 2016-02-26 DIAGNOSIS — D471 Chronic myeloproliferative disease: Secondary | ICD-10-CM

## 2016-02-26 LAB — COMPREHENSIVE METABOLIC PANEL
ALT: 8 U/L — ABNORMAL LOW (ref 14–54)
ANION GAP: 5 (ref 5–15)
AST: 16 U/L (ref 15–41)
Albumin: 4 g/dL (ref 3.5–5.0)
Alkaline Phosphatase: 58 U/L (ref 38–126)
BILIRUBIN TOTAL: 0.6 mg/dL (ref 0.3–1.2)
BUN: 10 mg/dL (ref 6–20)
CO2: 30 mmol/L (ref 22–32)
Calcium: 8.9 mg/dL (ref 8.9–10.3)
Chloride: 103 mmol/L (ref 101–111)
Creatinine, Ser: 0.77 mg/dL (ref 0.44–1.00)
Glucose, Bld: 112 mg/dL — ABNORMAL HIGH (ref 65–99)
POTASSIUM: 4.8 mmol/L (ref 3.5–5.1)
Sodium: 138 mmol/L (ref 135–145)
TOTAL PROTEIN: 6.7 g/dL (ref 6.5–8.1)

## 2016-02-26 LAB — CBC WITH DIFFERENTIAL/PLATELET
BASOS PCT: 2 %
Basophils Absolute: 0.2 10*3/uL — ABNORMAL HIGH (ref 0.0–0.1)
EOS PCT: 9 %
Eosinophils Absolute: 0.9 10*3/uL — ABNORMAL HIGH (ref 0.0–0.7)
HEMATOCRIT: 45.6 % (ref 36.0–46.0)
Hemoglobin: 14.2 g/dL (ref 12.0–15.0)
LYMPHS ABS: 2 10*3/uL (ref 0.7–4.0)
Lymphocytes Relative: 21 %
MCH: 25.6 pg — ABNORMAL LOW (ref 26.0–34.0)
MCHC: 31.1 g/dL (ref 30.0–36.0)
MCV: 82.3 fL (ref 78.0–100.0)
MONO ABS: 0.9 10*3/uL (ref 0.1–1.0)
Monocytes Relative: 9 %
Neutro Abs: 5.5 10*3/uL (ref 1.7–7.7)
Neutrophils Relative %: 59 %
Platelets: 592 10*3/uL — ABNORMAL HIGH (ref 150–400)
RBC: 5.54 MIL/uL — ABNORMAL HIGH (ref 3.87–5.11)
RDW: 25.1 % — AB (ref 11.5–15.5)
WBC: 9.5 10*3/uL (ref 4.0–10.5)

## 2016-02-27 ENCOUNTER — Other Ambulatory Visit (HOSPITAL_COMMUNITY): Payer: Medicare Other

## 2016-03-03 ENCOUNTER — Other Ambulatory Visit: Payer: Self-pay | Admitting: Family Medicine

## 2016-03-09 ENCOUNTER — Other Ambulatory Visit: Payer: Self-pay | Admitting: Family Medicine

## 2016-03-13 ENCOUNTER — Other Ambulatory Visit (HOSPITAL_COMMUNITY): Payer: Medicare Other

## 2016-03-13 ENCOUNTER — Ambulatory Visit (HOSPITAL_COMMUNITY): Payer: Medicare Other | Admitting: Hematology & Oncology

## 2016-03-25 ENCOUNTER — Encounter (HOSPITAL_COMMUNITY): Payer: Self-pay | Admitting: Hematology & Oncology

## 2016-03-25 ENCOUNTER — Encounter (HOSPITAL_COMMUNITY): Payer: Medicare Other | Attending: Hematology & Oncology | Admitting: Hematology & Oncology

## 2016-03-25 ENCOUNTER — Encounter (HOSPITAL_COMMUNITY): Payer: Medicare Other

## 2016-03-25 DIAGNOSIS — E611 Iron deficiency: Secondary | ICD-10-CM

## 2016-03-25 DIAGNOSIS — D471 Chronic myeloproliferative disease: Secondary | ICD-10-CM | POA: Diagnosis not present

## 2016-03-25 DIAGNOSIS — Z8673 Personal history of transient ischemic attack (TIA), and cerebral infarction without residual deficits: Secondary | ICD-10-CM | POA: Diagnosis not present

## 2016-03-25 DIAGNOSIS — J449 Chronic obstructive pulmonary disease, unspecified: Secondary | ICD-10-CM | POA: Diagnosis not present

## 2016-03-25 DIAGNOSIS — D473 Essential (hemorrhagic) thrombocythemia: Secondary | ICD-10-CM | POA: Diagnosis not present

## 2016-03-25 LAB — COMPREHENSIVE METABOLIC PANEL
ALBUMIN: 4.2 g/dL (ref 3.5–5.0)
ALT: 7 U/L — ABNORMAL LOW (ref 14–54)
ANION GAP: 10 (ref 5–15)
AST: 17 U/L (ref 15–41)
Alkaline Phosphatase: 50 U/L (ref 38–126)
BILIRUBIN TOTAL: 0.9 mg/dL (ref 0.3–1.2)
BUN: 17 mg/dL (ref 6–20)
CO2: 27 mmol/L (ref 22–32)
Calcium: 9.4 mg/dL (ref 8.9–10.3)
Chloride: 100 mmol/L — ABNORMAL LOW (ref 101–111)
Creatinine, Ser: 0.94 mg/dL (ref 0.44–1.00)
GFR calc Af Amer: >60 mL/min (ref >60)
GFR calc non Af Amer: 57 mL/min — ABNORMAL LOW (ref >60)
GLUCOSE: 119 mg/dL — AB (ref 65–99)
POTASSIUM: 5.1 mmol/L (ref 3.5–5.1)
SODIUM: 137 mmol/L (ref 135–145)
TOTAL PROTEIN: 7.1 g/dL (ref 6.5–8.1)

## 2016-03-25 LAB — CBC WITH DIFFERENTIAL/PLATELET
BASOS ABS: 0.2 10*3/uL — AB (ref 0.0–0.1)
BASOS PCT: 3 %
EOS PCT: 8 %
Eosinophils Absolute: 0.7 10*3/uL (ref 0.0–0.7)
HCT: 46.1 % — ABNORMAL HIGH (ref 36.0–46.0)
Hemoglobin: 14.4 g/dL (ref 12.0–15.0)
Lymphocytes Relative: 23 %
Lymphs Abs: 1.9 10*3/uL (ref 0.7–4.0)
MCH: 26.8 pg (ref 26.0–34.0)
MCHC: 31.2 g/dL (ref 30.0–36.0)
MCV: 85.7 fL (ref 78.0–100.0)
MONO ABS: 0.7 10*3/uL (ref 0.1–1.0)
Monocytes Relative: 9 %
Neutro Abs: 4.6 10*3/uL (ref 1.7–7.7)
Neutrophils Relative %: 57 %
PLATELETS: 598 10*3/uL — AB (ref 150–400)
RBC: 5.38 MIL/uL — AB (ref 3.87–5.11)
RDW: 26.5 % — ABNORMAL HIGH (ref 11.5–15.5)
WBC: 8.1 10*3/uL (ref 4.0–10.5)

## 2016-03-25 MED ORDER — HYDROXYUREA 500 MG PO CAPS: ORAL_CAPSULE | ORAL | 1 refills | Status: DC

## 2016-03-25 NOTE — Progress Notes (Signed)
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 female is here for follow-up of JAK 2 positive Essential thrombocytosis.  Jordan [name] is here today accompanied by her husband, [name]. She uses a cane at home for ambulation.   I have reviewed the labs with the patient.   States her left eye feels so much better since she got the lesion under the L eyelid drained. She said that the doctor said the lesion wouldn't go away, and the only way to get rid of it would be to go in and surgically remove the "sac."   She asked if she could donate her platelets to the [employer].  States she doesn't eat liver, but has been eating ground meat, the lean kind.   States she isn't wearing a bra today because it is hard to breath when she wears a tight bra.   She notes she doesn't remember if she has chest pain when her COPD acts up because she only focused on her SOB. She has SOB when it happens and she uses her breath pump to alleviate it. She reports wheezing.  States she has a chest flutter. She doesn't call it pain, but just pressure.She refused to be referred to a cardiologist for her heart.   States she gets bad coughs at night. She uses peppermint candy to alleviate her coughs.   She is tolerating hydrea without difficulty. No nausea or vomiting. She notes no problems with the medication at all.   MEDICAL HISTORY:  Past Medical History:  Diagnosis Date  . Anxiety   . Arthritis   . Asthma   . Cancer [facility])    Skin cancer  . Cataract   . Cataracts, both eyes 05/2014  . COPD (chronic obstructive pulmonary disease) ([place])   . Depression   . Diabetes mellitus without complication ([place])   . GERD (gastroesophageal  reflux disease)   . Hyperlipidemia   . Hypertension   . Seizures ([place])    From RH Negative bvlood when pregnant  . Stroke ([place])   . Thyroid disease     SURGICAL HISTORY: Past Surgical History:  Procedure Laterality Date  . ABDOMINAL HYSTERECTOMY    . ANKLE SURGERY Right   . BREAST SURGERY     Bx, none malignant  . COLONOSCOPY  2013   normal per patient  . ESOPHAGOGASTRODUODENOSCOPY N/A 01/14/2016   Procedure: ESOPHAGOGASTRODUODENOSCOPY (EGD);  Surgeon: Danie Binder, MD;  Location: [facility];  Service: Endoscopy;  Laterality: N/A;  8:30 am  . ROTATOR CUFF REPAIR Right   . Skin Cancers    . TONSILLECTOMY      SOCIAL HISTORY: Social History   Social History  . Marital status: Married    Spouse name: N/A  . Number of children: N/A  . Years of education: N/A   Occupational History  . Not on file.   Social History Main Topics  . Smoking status: Former Smoker    Packs/day: 3.00    Years: 36.00    Types: Cigarettes    Quit date: 03/10/2000  . Smokeless tobacco: Never Used  . Alcohol use No  . Drug use: No  . Sexual activity: No   Other Topics Concern  . Not on file  Social History Narrative  . 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 Alzheimer's disease Mother   . Arthritis Father   . Stroke Maternal Aunt    Mom passed in mid 49s from alzheimers and anorexia. She had thrombocytosis.  Dad passed in mid 67s from heart failure. He had arthritis and gout (couldn't walk)  ALLERGIES:  is allergic to bee venom; codeine; lipitor [atorvastatin]; penicillins; morphine and related; and tetracyclines & related.  MEDICATIONS:    Current Outpatient Prescriptions  Medication Sig Dispense Refill  . albuterol (PROVENTIL HFA;VENTOLIN HFA) 108 (90 Base) MCG/ACT inhaler Inhale 2 puffs into the lungs every 4 (four) hours as needed for wheezing or shortness of breath. 1 Inhaler 3  . ALPRAZolam (XANAX) 0.5 MG tablet 1-2 tablets twice a day as needed 60 tablet 2  . betamethasone, augmented, (DIPROLENE) 0.05 % lotion     . cetirizine (ZYRTEC) 10 MG tablet Take 10 mg by mouth daily. As needed    . cyanocobalamin (,VITAMIN B-12,) 1000 MCG/ML injection 1 ml IM monthly 30 mL 1  . gabapentin (NEURONTIN) 300 MG capsule Take 1 capsule by mouth at bedtime.  3  . gabapentin (NEURONTIN) 300 MG capsule TAKE 1 CAPSULE (300 MG TOTAL) BY MOUTH AT BEDTIME. 30 capsule 0  . hydroxyurea (HYDREA) 500 MG capsule 1000 mg PO on Mon-Wed-Fri and 500 mg PO all other days.  May take with food to minimize GI side effects. 30 capsule 1  . levothyroxine (SYNTHROID, LEVOTHROID) 112 MCG tablet TAKE 1 TABLET (112 MCG TOTAL) BY MOUTH DAILY. 90 tablet 3  . metFORMIN (GLUCOPHAGE) 500 MG tablet Take 1 tablet (500 mg total) by mouth 2 (two) times daily with a meal. 60 tablet 0  . metoprolol succinate (TOPROL-XL) 25 MG 24 hr tablet TAKE 1 TABLET (25 MG TOTAL) BY MOUTH DAILY. 90 tablet 2  . Multiple Vitamins-Minerals (ICAPS AREDS 2 PO) Take 1 capsule by mouth 2 (two) times daily.    Marland Kitchen omeprazole (PRILOSEC) 20 MG capsule 1 po every morning 30 minutes prior to your first meal. 31 capsule 11  . PARoxetine (PAXIL) 20 MG tablet TAKE 1 TABLET (20 MG TOTAL) BY MOUTH DAILY. 30 tablet 0  . promethazine (PHENERGAN) 12.5 MG tablet Take 1-2 tablets (12.5-25 mg total) by mouth every 6 (six) hours as needed for nausea or vomiting. 30 tablet 0  . ranitidine (ZANTAC) 150 MG tablet TAKE 1 TABLET (150 MG TOTAL) BY MOUTH 2 (TWO) TIMES DAILY. 60 tablet 2  . traMADol (ULTRAM) 50 MG tablet      No current facility-administered medications for this visit.      Review of Systems   Constitutional: Negative.   HENT: Negative.   Eyes: Negative.        Left eye feels so much better since she got it drained  Respiratory: Positive for cough (at night, uses peppermint candy for it), shortness of breath (associated with her COPD, uses a breath pump for relief) and wheezing.        Hard to breath when she wears tight bra  Cardiovascular: Negative.        Chest "  flutter" Doesn't call it pain, but feels like pressure  Gastrointestinal: Negative.   Genitourinary: Negative.   Musculoskeletal: Negative.   Skin: Negative.        Lesion under the left eye  Endo/Heme/Allergies: Negative.   Psychiatric/Behavioral: Negative.   All other systems reviewed and are negative. 14 point ROS was done and is otherwise as detailed above or in HPI   PHYSICAL EXAMINATION: ECOG PERFORMANCE STATUS: 2 - Symptomatic, <50% confined to bed  Vitals:   03/25/16 1012  BP: (!) 133/47  Pulse: (!) 59  Resp: 16  Temp: 97.5 F (36.4 C)   Filed Weights   03/25/16 1012  Weight: 143 lb 12.8 oz (65.2 kg)     Physical Exam  Constitutional: She is oriented to person, place, and time and well-developed, well-nourished, and in no distress.  able to get on the exam table with  assistance and a cane.   HENT:  Head: Normocephalic and atraumatic.  Mouth/Throat: No oropharyngeal exudate.  Eyes: EOM are normal. Pupils are equal, round, and reactive to light. No scleral icterus.  Lesion under left eye, looks better than last time  Neck: Normal range of motion. Neck supple. No thyromegaly present.  Cardiovascular: Normal rate, regular rhythm and normal heart sounds.   Pulmonary/Chest: Effort normal. She has wheezes.  Abdominal: Soft. Bowel sounds are normal. She exhibits no distension. There is no tenderness. There is no rebound and no guarding.  Musculoskeletal: Normal range of motion. She exhibits no tenderness.  Lymphadenopathy:    She has no cervical adenopathy.  Neurological: She is alert and  oriented to person, place, and time. No cranial nerve deficit.  Skin: Skin is warm and dry.  Lesion under left eye, looks better than last time  Psychiatric: Mood, affect and judgment normal.  Nursing note and vitals reviewed.   LABORATORY DATA:  I have reviewed the data as listed Lab Results  Component Value Date   WBC 8.1 03/25/2016   HGB 14.4 03/25/2016   HCT 46.1 (H) 03/25/2016   MCV 85.7 03/25/2016   PLT 598 (H) 03/25/2016   CMP     Component Value Date/Time   NA 137 03/25/2016 0847   NA 139 08/14/2015 1527   K 5.1 03/25/2016 0847   CL 100 (L) 03/25/2016 0847   CO2 27 03/25/2016 0847   GLUCOSE 119 (H) 03/25/2016 0847   BUN 17 03/25/2016 0847   BUN 11 08/14/2015 1527   CREATININE 0.94 03/25/2016 0847   CALCIUM 9.4 03/25/2016 0847   PROT 7.1 03/25/2016 0847   PROT 6.9 04/17/2015 0916   ALBUMIN 4.2 03/25/2016 0847   ALBUMIN 4.5 04/17/2015 0916   AST 17 03/25/2016 0847   ALT 7 (L) 03/25/2016 0847   ALKPHOS 50 03/25/2016 0847   BILITOT 0.9 03/25/2016 0847   BILITOT 0.6 04/17/2015 0916   GFRNONAA 57 (L) 03/25/2016 0847   GFRAA >60 03/25/2016 0847   Results for Cowden, Rainy (MRN [id number])   Ref. Range 12/10/2015 12:15 01/11/2016 16:20  Ferritin Latest Ref Range: 11 - 307 ng/mL 15 11    PATHOLOGY     ASSESSMENT & PLAN:  Essential thrombocytosis, JAK 2 positive Iron deficiency Hx CVA Hydrea  We discussed and reviewed her blood work. Labs are noted above and under HPI.We discussed MPD today. We reviewed JAK 2 positive Essential thrombocytosis.  She has seen dermatology for her left eye lesion.    Labs reviewed. Results noted above.   She has iron deficiency, EGD was done by  Dr. Oneida Alar in November of last year. She has not had a recent C-scope. Given her JAK 2 positive disease I would not replace her iron but would certainly refer back to GI. Discuss at f/u.   I changed her Hydrea dose to 1000 mg Monday, Wednesday, Friday, and Saturday.  I asked her if she  wanted to be referred to cardiology for her heart. She is not interested currently.   She will follow up in 2 months.   Labs will be checked in the interim given her change in hydrea dosing.   This document serves as a record of services personally performed by Ancil Linsey, MD. It was created on her behalf by Shirlean Mylar, a trained medical scribe. The creation of this record is based on the scribe's personal observations and the provider's statements to them. This document has been checked and approved by the attending provider.  I have reviewed the above documentation for accuracy and completeness and I agree with the above.  This note was electronically signed.    Molli Hazard, MD  03/25/2016 10:55 AM

## 2016-03-25 NOTE — Patient Instructions (Signed)
[place] at [facility] Discharge Instructions  RECOMMENDATIONS MADE BY THE CONSULTANT AND ANY TEST RESULTS WILL BE SENT TO YOUR REFERRING PHYSICIAN.  You were seen today by Dr. Judeth Porch your Hydrea to 1000mg  M, W, F, Sat and 500mg  T, Th, Sun You will get a new prescription for the [facility] Lab work in 3 weeks Follow up in clinic in 2 months with labs  Thank you for choosing [place] at [facility] to provide your oncology and hematology care.  To afford each patient quality time with our provider, please arrive at least 15 minutes before your scheduled appointment time.    If you have a lab appointment with the [facility] please come in thru the  Main Entrance and check in at the main information desk  You need to re-schedule your appointment should you arrive 10 or more minutes late.  We strive to give you quality time with our providers, and arriving late affects you and other patients whose appointments are after yours.  Also, if you no show three or more times for appointments you may be dismissed from the clinic at the providers discretion.     Again, thank you for choosing [facility].  Our hope is that these requests will decrease the amount of time that you wait before being seen by our physicians.       _____________________________________________________________  Should you have questions after your visit to [facility], please contact our office at [phone] between the hours of 8:30 a.m. and 4:30 p.m.  Voicemails left after 4:30 p.m. will not be returned until the following business day.  For prescription refill requests, have your pharmacy contact our office.       Resources For Cancer Patients and their Caregivers ? American Cancer Society: Can assist with transportation, wigs, general needs, runs Look Good Feel Better.        289-819-2008 ? Cancer Care: Provides financial  assistance, online support groups, medication/co-pay assistance.  1-800-813-HOPE (912)054-6792) ? [facility] Assists [place] cancer patients and their families through emotional , educational and financial support.  330 472 1670 ? [place] DSS Where to apply for food stamps, Medicaid and utility assistance. 613-733-4210 ? [employer]: Transportation to medical appointments. (828)377-5448 ? Social Security Administration: May apply for disability if have a Stage IV cancer. 613-662-1896 (909)793-9956 ? [employer], Disability and Transit Services: Assists with nutrition, care and transit needs. [place] Support Programs: @10RELATIVEDAYS @ > Cancer Support Group  2nd Tuesday of the month 1pm-2pm, Journey Room  > Creative Journey  3rd Tuesday of the month 1130am-1pm, Journey Room  > Look Good Feel Better  1st Wednesday of the month 10am-12 noon, Journey Room (Call [place] to register [phone])

## 2016-04-05 ENCOUNTER — Encounter (HOSPITAL_COMMUNITY): Payer: Self-pay | Admitting: Hematology & Oncology

## 2016-04-10 ENCOUNTER — Encounter (HOSPITAL_COMMUNITY): Payer: Self-pay | Admitting: Hematology & Oncology

## 2016-04-12 ENCOUNTER — Other Ambulatory Visit: Payer: Self-pay | Admitting: Family Medicine

## 2016-04-15 ENCOUNTER — Encounter (HOSPITAL_COMMUNITY): Payer: Medicare Other | Attending: Oncology

## 2016-04-15 ENCOUNTER — Ambulatory Visit: Payer: Medicare Other | Admitting: Gastroenterology

## 2016-04-15 ENCOUNTER — Other Ambulatory Visit (HOSPITAL_COMMUNITY): Payer: Medicare Other

## 2016-04-15 DIAGNOSIS — D471 Chronic myeloproliferative disease: Secondary | ICD-10-CM | POA: Diagnosis not present

## 2016-04-15 LAB — COMPREHENSIVE METABOLIC PANEL
ALBUMIN: 4.1 g/dL (ref 3.5–5.0)
ALT: 10 U/L — ABNORMAL LOW (ref 14–54)
ANION GAP: 8 (ref 5–15)
AST: 17 U/L (ref 15–41)
Alkaline Phosphatase: 62 U/L (ref 38–126)
BILIRUBIN TOTAL: 0.8 mg/dL (ref 0.3–1.2)
BUN: 16 mg/dL (ref 6–20)
CHLORIDE: 103 mmol/L (ref 101–111)
CO2: 29 mmol/L (ref 22–32)
Calcium: 9.4 mg/dL (ref 8.9–10.3)
Creatinine, Ser: 0.97 mg/dL (ref 0.44–1.00)
GFR calc Af Amer: >60 mL/min (ref >60)
GFR, EST NON AFRICAN AMERICAN: 55 mL/min — AB (ref >60)
Glucose, Bld: 105 mg/dL — ABNORMAL HIGH (ref 65–99)
POTASSIUM: 5.3 mmol/L — AB (ref 3.5–5.1)
Sodium: 140 mmol/L (ref 135–145)
TOTAL PROTEIN: 6.8 g/dL (ref 6.5–8.1)

## 2016-04-15 LAB — CBC WITH DIFFERENTIAL/PLATELET
BASOS ABS: 0.1 10*3/uL (ref 0.0–0.1)
Basophils Relative: 2 %
Eosinophils Absolute: 0.6 10*3/uL (ref 0.0–0.7)
Eosinophils Relative: 9 %
HEMATOCRIT: 46 % (ref 36.0–46.0)
Hemoglobin: 14.9 g/dL (ref 12.0–15.0)
LYMPHS ABS: 1.9 10*3/uL (ref 0.7–4.0)
Lymphocytes Relative: 27 %
MCH: 29.3 pg (ref 26.0–34.0)
MCHC: 32.4 g/dL (ref 30.0–36.0)
MCV: 90.4 fL (ref 78.0–100.0)
MONOS PCT: 9 %
Monocytes Absolute: 0.6 10*3/uL (ref 0.1–1.0)
Neutro Abs: 3.9 10*3/uL (ref 1.7–7.7)
Neutrophils Relative %: 53 %
PLATELETS: 475 10*3/uL — AB (ref 150–400)
RBC: 5.09 MIL/uL (ref 3.87–5.11)
RDW: 25.6 % — AB (ref 11.5–15.5)
WBC: 7.1 10*3/uL (ref 4.0–10.5)

## 2016-04-16 ENCOUNTER — Other Ambulatory Visit: Payer: Self-pay

## 2016-04-16 ENCOUNTER — Encounter: Payer: Self-pay | Admitting: Gastroenterology

## 2016-04-16 ENCOUNTER — Ambulatory Visit (INDEPENDENT_AMBULATORY_CARE_PROVIDER_SITE_OTHER): Payer: Medicare Other | Admitting: Gastroenterology

## 2016-04-16 VITALS — BP 131/69 | HR 60 | Temp 97.6°F | Ht 66.0 in | Wt 164.0 lb

## 2016-04-16 DIAGNOSIS — K219 Gastro-esophageal reflux disease without esophagitis: Secondary | ICD-10-CM

## 2016-04-16 DIAGNOSIS — J329 Chronic sinusitis, unspecified: Secondary | ICD-10-CM

## 2016-04-16 DIAGNOSIS — R490 Dysphonia: Secondary | ICD-10-CM

## 2016-04-16 DIAGNOSIS — R110 Nausea: Secondary | ICD-10-CM

## 2016-04-16 DIAGNOSIS — D508 Other iron deficiency anemias: Secondary | ICD-10-CM

## 2016-04-16 DIAGNOSIS — D509 Iron deficiency anemia, unspecified: Secondary | ICD-10-CM

## 2016-04-16 DIAGNOSIS — D649 Anemia, unspecified: Secondary | ICD-10-CM | POA: Insufficient documentation

## 2016-04-16 MED ORDER — PEG 3350-KCL-NA BICARB-NACL 420 G PO SOLR: 4000.0000 mL | ORAL | 0 refills | Status: DC

## 2016-04-16 MED ORDER — PROMETHAZINE HCL 12.5 MG PO TABS: 12.5000 mg | ORAL_TABLET | Freq: Three times a day (TID) | ORAL | 0 refills | Status: DC | PRN

## 2016-04-16 NOTE — Patient Instructions (Signed)
Continue Prilosec once each morning, 30 minutes before breakfast. Only take ranitidine (Zantac) at night as needed. This medication works best when taken as needed.  We have referred you to an Ear, Nose, and Throat (ENT) specialist due to your chronic hoarseness.   We have scheduled you for a colonoscopy with Dr. Oneida Alar due to low iron.

## 2016-04-16 NOTE — Assessment & Plan Note (Signed)
GERD symptoms controlled with Prilosec once daily, and she is taking Zantac BID. Discussed using Zantac only prn for best effect due to tachyphylaxis. Continues to have vocal hoarseness. Will refer to ENT for further evaluation.

## 2016-04-16 NOTE — Progress Notes (Addendum)
REVIEWED-NO ADDITIONAL RECOMMENDATIONS.  Referring Provider: Wardell Honour, MD Primary Care Physician:  Wardell Honour, MD  Primary GI: Dr. Oneida Alar   Chief Complaint  Patient presents with  . GI Problem    HPI:   Jordan [name] is a 77 y.o. female presenting today with a history of GERD, hoarseness, recently diagnosed with myeloproliferative disease and followed by Oncology. Last saw Dr. Whitney Muse on 03/25/16. On Hydrea. EGD completed in Nov 2017 with normal esophagus, hyperplastic polyp, gastritis. She is taking Prilosec once a day and Zantac BID. Notably, her ferritin was 11 in Nov 2017. Dr. Whitney Muse has recommend colonoscopy due to iron deficiency.    States she feels like stomach is extremely loud at night. No pain. States "unbelievably loud". No lower palate. Going to get a plate soon. Needs Phenergan. Only has nausea when taking larger doses of Hydrea. Not taking a probiotic. Last colonoscopy at least 5 years ago in [place]. She believes she had a polyp that was extremely small and the doctor told her husband "you have a beautiful colon". Wants to plan for a colonoscopy in about 4 weeks. No rectal bleeding. No changes in bowel habits. Hoarseness comes and goes. Gets worse as she talks.  Chronic sinus issues.   Past Medical History:  Diagnosis Date  . Anxiety   . Arthritis   . Asthma   . Cancer [facility])    Skin cancer  . Cataract   . Cataracts, both eyes 05/2014  . COPD (chronic obstructive pulmonary disease) ([place])   . Depression   . Diabetes mellitus without complication ([place])   . GERD (gastroesophageal reflux disease)   . Hyperlipidemia   . Hypertension   . Seizures ([place])    From RH Negative bvlood when pregnant  . Stroke ([place])   . Thyroid disease     Past Surgical History:  Procedure Laterality Date  . ABDOMINAL HYSTERECTOMY    . ANKLE SURGERY Right   . BREAST SURGERY     Bx, none malignant  . COLONOSCOPY  2013   normal per patient  .  ESOPHAGOGASTRODUODENOSCOPY N/A 01/14/2016   Dr. Oneida Alar: normal esophagus, single hyperplastic gastric polyp, normal duodenum, negative H.pylori  . ROTATOR CUFF REPAIR Right   . Skin Cancers    . TONSILLECTOMY      Current Outpatient Prescriptions  Medication Sig Dispense Refill  . albuterol (PROVENTIL HFA;VENTOLIN HFA) 108 (90 Base) MCG/ACT inhaler Inhale 2 puffs into the lungs every 4 (four) hours as needed for wheezing or shortness of breath. 1 Inhaler 3  . ALPRAZolam (XANAX) 0.5 MG tablet 1-2 tablets twice a day as needed 60 tablet 2  . betamethasone, augmented, (DIPROLENE) 0.05 % lotion     . cetirizine (ZYRTEC) 10 MG tablet Take 10 mg by mouth daily. As needed    . cyanocobalamin (,VITAMIN B-12,) 1000 MCG/ML injection 1 ml IM monthly 30 mL 1  . gabapentin (NEURONTIN) 300 MG capsule Take 1 capsule by mouth at bedtime.  3  . hydroxyurea (HYDREA) 500 MG capsule Take 1000 mg on Mon-Wed-Fri-Sat and 500 mg Tue-Thur-Sun.  May take with food to minimize GI side effects. 44 capsule 1  . levothyroxine (SYNTHROID, LEVOTHROID) 112 MCG tablet TAKE 1 TABLET (112 MCG TOTAL) BY MOUTH DAILY. 90 tablet 3  . metFORMIN (GLUCOPHAGE) 500 MG tablet Take 1 tablet (500 mg total) by mouth 2 (two) times daily with a meal. 60 tablet 0  . metoprolol succinate (TOPROL-XL) 25 MG 24 hr tablet TAKE 1  TABLET (25 MG TOTAL) BY MOUTH DAILY. 90 tablet 2  . Multiple Vitamins-Minerals (ICAPS AREDS 2 PO) Take 1 capsule by mouth 2 (two) times daily.    Marland Kitchen omeprazole (PRILOSEC) 20 MG capsule 1 po every morning 30 minutes prior to your first meal. 31 capsule 11  . PARoxetine (PAXIL) 20 MG tablet TAKE 1 TABLET (20 MG TOTAL) BY MOUTH DAILY. 30 tablet 0  . promethazine (PHENERGAN) 12.5 MG tablet Take 1 tablet (12.5 mg total) by mouth every 8 (eight) hours as needed for nausea or vomiting. 30 tablet 0  . ranitidine (ZANTAC) 150 MG tablet TAKE 1 TABLET (150 MG TOTAL) BY MOUTH 2 (TWO) TIMES DAILY. 60 tablet 2  . traMADol (ULTRAM) 50 MG  tablet     . polyethylene glycol-electrolytes (TRILYTE) 420 g solution Take 4,000 mLs by mouth as directed. 4000 mL 0   No current facility-administered medications for this visit.     Allergies as of 04/16/2016 - Review Complete 04/16/2016  Allergen Reaction Noted  . Bee venom Anaphylaxis 10/18/2012  . Codeine Itching 10/04/2012  . Lipitor [atorvastatin]  10/06/2012  . Penicillins Hives 10/04/2012  . Morphine and related Rash 10/04/2012  . Tetracyclines & related Rash 10/04/2012    Family History  Problem Relation Age of Onset  . Alzheimer's disease Mother   . Arthritis Father   . Stroke Maternal Aunt     Social History   Social History  . Marital status: Married    Spouse name: N/A  . Number of children: N/A  . Years of education: N/A   Social History Main Topics  . Smoking status: Former Smoker    Packs/day: 3.00    Years: 36.00    Types: Cigarettes    Quit date: 03/10/2000  . Smokeless tobacco: Never Used  . Alcohol use No  . Drug use: No  . Sexual activity: No   Other Topics Concern  . None   Social History Narrative  . None    Review of Systems: As mentioned in HPI   Physical Exam: BP 131/69   Pulse 60   Temp 97.6 F (36.4 C) (Oral)   Ht 5\' 6"  (1.676 m)   Wt 164 lb (74.4 kg)   BMI 26.47 kg/m  General:   Alert and oriented. No distress noted. Pleasant and cooperative. Voice becomes more hoarse as she talks during the visit  Head:  Normocephalic and atraumatic. Eyes:  Conjuctiva clear without scleral icterus. Mouth:  Oral mucosa pink and moist. Edentulous lower palate  Heart:  S1, S2 present without murmurs Lungs: Expiratory wheeze bilaterally but without labored breathing  Abdomen:  +BS, soft, non-tender and non-distended. No rebound or guarding. No HSM or masses noted. Msk:  Symmetrical without gross deformities. Normal posture. Extremities:  Without edema. Neurologic:  Alert and  oriented x4 Psych:  Alert and cooperative. Normal mood and  affect.  Lab Results  Component Value Date   WBC 7.1 04/15/2016   HGB 14.9 04/15/2016   HCT 46.0 04/15/2016   MCV 90.4 04/15/2016   PLT 475 (H) 04/15/2016   Lab Results  Component Value Date   IRON 72 01/11/2016   TIBC 448 01/11/2016   FERRITIN 11 01/11/2016

## 2016-04-16 NOTE — Assessment & Plan Note (Signed)
History of MPD, recently diagnosed. Ferritin 11. Agree with Hematology regarding updated colonoscopy due to iron deficiency. Last colonoscopy at least 5 years ago at [facility] per patient.   Proceed with colonoscopy with Dr. Oneida Alar in the near future. The risks, benefits, and alternatives have been discussed in detail with the patient. They state understanding and desire to proceed.  Phenergan 12.5 mg IV on call

## 2016-04-17 ENCOUNTER — Other Ambulatory Visit (HOSPITAL_COMMUNITY): Payer: Self-pay

## 2016-04-17 ENCOUNTER — Other Ambulatory Visit: Payer: Self-pay | Admitting: Family Medicine

## 2016-04-17 DIAGNOSIS — D471 Chronic myeloproliferative disease: Secondary | ICD-10-CM

## 2016-04-17 MED ORDER — HYDROXYUREA 500 MG PO CAPS: ORAL_CAPSULE | ORAL | 1 refills | Status: DC

## 2016-04-17 NOTE — Progress Notes (Signed)
cc'ed to pcp °

## 2016-05-08 ENCOUNTER — Ambulatory Visit (INDEPENDENT_AMBULATORY_CARE_PROVIDER_SITE_OTHER): Payer: Medicare Other | Admitting: Otolaryngology

## 2016-05-08 DIAGNOSIS — R490 Dysphonia: Secondary | ICD-10-CM

## 2016-05-08 DIAGNOSIS — J382 Nodules of vocal cords: Secondary | ICD-10-CM | POA: Diagnosis not present

## 2016-05-14 ENCOUNTER — Encounter (HOSPITAL_COMMUNITY): Payer: Self-pay | Admitting: *Deleted

## 2016-05-14 ENCOUNTER — Encounter (HOSPITAL_COMMUNITY)
Admission: RE | Disposition: A | Discharge status: Home / Self Care | Payer: Self-pay | Source: Ambulatory Visit | Attending: Gastroenterology

## 2016-05-14 ENCOUNTER — Telehealth: Payer: Self-pay

## 2016-05-14 ENCOUNTER — Ambulatory Visit (HOSPITAL_COMMUNITY)
Admission: RE | Admit: 2016-05-14 | Discharge: 2016-05-14 | Disposition: A | Discharge status: Home / Self Care | Payer: Medicare Other | Source: Ambulatory Visit | Attending: Gastroenterology | Admitting: Gastroenterology

## 2016-05-14 DIAGNOSIS — D649 Anemia, unspecified: Secondary | ICD-10-CM | POA: Diagnosis not present

## 2016-05-14 DIAGNOSIS — M1990 Unspecified osteoarthritis, unspecified site: Secondary | ICD-10-CM | POA: Insufficient documentation

## 2016-05-14 DIAGNOSIS — J449 Chronic obstructive pulmonary disease, unspecified: Secondary | ICD-10-CM | POA: Diagnosis not present

## 2016-05-14 DIAGNOSIS — K648 Other hemorrhoids: Secondary | ICD-10-CM | POA: Diagnosis not present

## 2016-05-14 DIAGNOSIS — E079 Disorder of thyroid, unspecified: Secondary | ICD-10-CM | POA: Insufficient documentation

## 2016-05-14 DIAGNOSIS — Z85828 Personal history of other malignant neoplasm of skin: Secondary | ICD-10-CM | POA: Insufficient documentation

## 2016-05-14 DIAGNOSIS — E785 Hyperlipidemia, unspecified: Secondary | ICD-10-CM | POA: Diagnosis not present

## 2016-05-14 DIAGNOSIS — Z7982 Long term (current) use of aspirin: Secondary | ICD-10-CM | POA: Diagnosis not present

## 2016-05-14 DIAGNOSIS — D509 Iron deficiency anemia, unspecified: Secondary | ICD-10-CM | POA: Diagnosis not present

## 2016-05-14 DIAGNOSIS — F419 Anxiety disorder, unspecified: Secondary | ICD-10-CM | POA: Diagnosis not present

## 2016-05-14 DIAGNOSIS — K5520 Angiodysplasia of colon without hemorrhage: Secondary | ICD-10-CM | POA: Diagnosis not present

## 2016-05-14 DIAGNOSIS — K5730 Diverticulosis of large intestine without perforation or abscess without bleeding: Secondary | ICD-10-CM | POA: Insufficient documentation

## 2016-05-14 DIAGNOSIS — Z7984 Long term (current) use of oral hypoglycemic drugs: Secondary | ICD-10-CM | POA: Insufficient documentation

## 2016-05-14 DIAGNOSIS — Z8673 Personal history of transient ischemic attack (TIA), and cerebral infarction without residual deficits: Secondary | ICD-10-CM | POA: Insufficient documentation

## 2016-05-14 DIAGNOSIS — I10 Essential (primary) hypertension: Secondary | ICD-10-CM | POA: Insufficient documentation

## 2016-05-14 DIAGNOSIS — Q438 Other specified congenital malformations of intestine: Secondary | ICD-10-CM | POA: Diagnosis not present

## 2016-05-14 DIAGNOSIS — R569 Unspecified convulsions: Secondary | ICD-10-CM | POA: Diagnosis not present

## 2016-05-14 DIAGNOSIS — E119 Type 2 diabetes mellitus without complications: Secondary | ICD-10-CM | POA: Insufficient documentation

## 2016-05-14 DIAGNOSIS — Z87891 Personal history of nicotine dependence: Secondary | ICD-10-CM | POA: Insufficient documentation

## 2016-05-14 DIAGNOSIS — K644 Residual hemorrhoidal skin tags: Secondary | ICD-10-CM | POA: Diagnosis not present

## 2016-05-14 DIAGNOSIS — K219 Gastro-esophageal reflux disease without esophagitis: Secondary | ICD-10-CM | POA: Insufficient documentation

## 2016-05-14 DIAGNOSIS — F329 Major depressive disorder, single episode, unspecified: Secondary | ICD-10-CM | POA: Diagnosis not present

## 2016-05-14 HISTORY — PX: COLONOSCOPY: SHX5424

## 2016-05-14 LAB — GLUCOSE, CAPILLARY: Glucose-Capillary: 112 mg/dL — ABNORMAL HIGH (ref 65–99)

## 2016-05-14 SURGERY — COLONOSCOPY
Anesthesia: Moderate Sedation

## 2016-05-14 MED ORDER — MEPERIDINE HCL 100 MG/ML IJ SOLN
INTRAMUSCULAR | Status: DC | PRN
  Administered 2016-05-14: 25 mg via INTRAVENOUS

## 2016-05-14 MED ORDER — SIMETHICONE 40 MG/0.6ML PO SUSP
ORAL | Status: AC
  Filled 2016-05-14: qty 30

## 2016-05-14 MED ORDER — MIDAZOLAM HCL 5 MG/5ML IJ SOLN
INTRAMUSCULAR | Status: AC
  Filled 2016-05-14: qty 10

## 2016-05-14 MED ORDER — PROMETHAZINE HCL 25 MG/ML IJ SOLN
12.5000 mg | Freq: Once | INTRAMUSCULAR | Status: AC
  Administered 2016-05-14: 12.5 mg via INTRAVENOUS

## 2016-05-14 MED ORDER — SODIUM CHLORIDE 0.9% FLUSH
INTRAVENOUS | Status: AC
  Filled 2016-05-14: qty 10

## 2016-05-14 MED ORDER — MEPERIDINE HCL 100 MG/ML IJ SOLN
INTRAMUSCULAR | Status: AC
  Filled 2016-05-14: qty 2

## 2016-05-14 MED ORDER — SODIUM CHLORIDE 0.9 % IV SOLN
INTRAVENOUS | Status: DC
  Administered 2016-05-14: 1000 mL via INTRAVENOUS

## 2016-05-14 MED ORDER — PROMETHAZINE HCL 25 MG/ML IJ SOLN
INTRAMUSCULAR | Status: AC
  Filled 2016-05-14: qty 1

## 2016-05-14 MED ORDER — MIDAZOLAM HCL 5 MG/5ML IJ SOLN
INTRAMUSCULAR | Status: DC | PRN
  Administered 2016-05-14: 2 mg via INTRAVENOUS
  Administered 2016-05-14 (×2): 1 mg via INTRAVENOUS

## 2016-05-14 MED ORDER — HYDROCORTISONE ACE-PRAMOXINE 1-1 % RE CREA: TOPICAL_CREAM | RECTAL | 0 refills | Status: DC

## 2016-05-14 NOTE — H&P (Signed)
Primary Care Physician:  Wardell Honour, MD Primary Gastroenterologist:  Dr. Oneida Alar  Pre-Procedure History & Physical: HPI:  Jordan [name] is a 76 y.o. female here for  ANEMIA.  Past Medical History:  Diagnosis Date  . Anxiety   . Arthritis   . Asthma   . Cancer [facility])    Skin cancer  . Cataract   . Cataracts, both eyes 05/2014  . COPD (chronic obstructive pulmonary disease) ([place])   . Depression   . Diabetes mellitus without complication ([place])   . GERD (gastroesophageal reflux disease)   . Hyperlipidemia   . Hypertension   . Seizures ([place])    From RH Negative bvlood when pregnant  . Stroke ([place])   . Thyroid disease     Past Surgical History:  Procedure Laterality Date  . ABDOMINAL HYSTERECTOMY    . ANKLE SURGERY Right   . BREAST SURGERY     Bx, none malignant  . COLONOSCOPY  2013   normal per patient  . ESOPHAGOGASTRODUODENOSCOPY N/A 01/14/2016   Dr. Oneida Alar: normal esophagus, single hyperplastic gastric polyp, normal duodenum, negative H.pylori  . ROTATOR CUFF REPAIR Right   . Skin Cancers    . TONSILLECTOMY    . TONSILLECTOMY      Prior to Admission medications   Medication Sig Start Date End Date Taking? Authorizing Provider  albuterol (PROVENTIL HFA;VENTOLIN HFA) 108 (90 Base) MCG/ACT inhaler Inhale 2 puffs into the lungs every 4 (four) hours as needed for wheezing or shortness of breath. 04/17/15  Yes Wardell Honour, MD  aspirin EC 81 MG tablet Take 81 mg by mouth daily.   Yes Historical Provider, MD  cetirizine (ZYRTEC) 10 MG tablet Take 10 mg by mouth daily as needed for allergies.    Yes Historical Provider, MD  fexofenadine-pseudoephedrine (ALLEGRA-D 24) 180-240 MG 24 hr tablet Take 1 tablet by mouth daily as needed (allergies).   Yes Historical Provider, MD  gabapentin (NEURONTIN) 300 MG capsule Take 300 mg by mouth at bedtime.  12/18/15  Yes Historical Provider, MD  hydroxyurea (HYDREA) 500 MG capsule Take 1000 mg on Mon-Wed-Fri-Sat and 500 mg Tue-Thur-Sun.   May take with food to minimize GI side effects. 04/17/16  Yes Manon Hilding Kefalas, PA-C  levothyroxine (SYNTHROID, LEVOTHROID) 112 MCG tablet TAKE 1 TABLET (112 MCG TOTAL) BY MOUTH DAILY. 05/30/15  Yes Wardell Honour, MD  metFORMIN (GLUCOPHAGE) 500 MG tablet Take 1 tablet (500 mg total) by mouth 2 (two) times daily with a meal. 12/15/14  Yes Wardell Honour, MD  metoprolol succinate (TOPROL-XL) 25 MG 24 hr tablet TAKE 1 TABLET (25 MG TOTAL) BY MOUTH DAILY. 04/17/16  Yes Timmothy Euler, MD  Multiple Vitamins-Minerals (ICAPS AREDS 2 PO) Take 1 capsule by mouth 2 (two) times daily.   Yes Historical Provider, MD  omeprazole (PRILOSEC) 20 MG capsule 1 po every morning 30 minutes prior to your first meal. Patient taking differently: Take 20 mg by mouth daily. every morning 30 minutes prior to your first meal. 01/14/16  Yes Danie Binder, MD  PARoxetine (PAXIL) 20 MG tablet TAKE 1 TABLET (20 MG TOTAL) BY MOUTH DAILY. 03/03/16  Yes Wardell Honour, MD  polyethylene glycol-electrolytes (TRILYTE) 420 g solution Take 4,000 mLs by mouth as directed. 04/16/16  Yes Danie Binder, MD  promethazine (PHENERGAN) 12.5 MG tablet Take 1 tablet (12.5 mg total) by mouth every 8 (eight) hours as needed for nausea or vomiting. 04/16/16  Yes Annitta Needs, NP  ALPRAZolam (  XANAX) 0.5 MG tablet 1-2 tablets twice a day as needed Patient taking differently: Take 0.25 mg by mouth 2 (two) times daily as needed for anxiety.  04/17/15   Wardell Honour, MD  ranitidine (ZANTAC) 150 MG tablet TAKE 1 TABLET (150 MG TOTAL) BY MOUTH 2 (TWO) TIMES DAILY. Patient not taking: Reported on 05/09/2016 02/18/16   Terald Sleeper, PA-C    Allergies as of 04/16/2016 - Review Complete 04/16/2016  Allergen Reaction Noted  . Bee venom Anaphylaxis 10/18/2012  . Codeine Itching 10/04/2012  . Lipitor [atorvastatin]  10/06/2012  . Penicillins Hives 10/04/2012  . Morphine and related Rash 10/04/2012  . Tetracyclines & related Rash 10/04/2012    Family  History  Problem Relation Age of Onset  . Alzheimer's disease Mother   . Arthritis Father   . Stroke Maternal Aunt     Social History   Social History  . Marital status: Married    Spouse name: N/A  . Number of children: N/A  . Years of education: N/A   Occupational History  . Not on file.   Social History Main Topics  . Smoking status: Former Smoker    Packs/day: 3.00    Years: 36.00    Types: Cigarettes    Quit date: 03/10/2000  . Smokeless tobacco: Never Used  . Alcohol use No  . Drug use: No  . Sexual activity: No   Other Topics Concern  . Not on file   Social History Narrative  . No narrative on file    Review of Systems: See HPI, otherwise negative ROS   Physical Exam: BP 129/60   Pulse 62   Temp 97.5 F (36.4 C) (Oral)   Resp 14   Ht 5\' 6"  (1.676 m)   Wt 159 lb (72.1 kg)   SpO2 97%   BMI 25.66 kg/m  General:   Alert,  pleasant and cooperative in NAD Head:  Normocephalic and atraumatic. Neck:  Supple; Lungs:  Clear throughout to auscultation.    Heart:  Regular rate and rhythm. Abdomen:  Soft, nontender and nondistended. Normal bowel sounds, without guarding, and without rebound.   Neurologic:  Alert and  oriented x4;  grossly normal neurologically.  Impression/Plan:    Anemia  Plan:  1. TCS TODAY. DISCUSSED PROCEDURE, BENEFITS, & RISKS: < 1% chance of medication reaction, bleeding, perforation, or rupture of spleen/liver.

## 2016-05-14 NOTE — Telephone Encounter (Signed)
LMOVM for pt to call office to schedule Givens Capsule study.

## 2016-05-14 NOTE — Op Note (Signed)
[facility] Patient Name: Jordan [name] Procedure Date: 05/14/2016 8:19 AM MRN: [id number] Date of Birth: June 21, 1940 Attending MD: Barney Drain , MD CSN: [id number] Age: 76 Admit Type: Outpatient Procedure:                ColonoscopyWITH APC R COLON Indications:              Anemia Providers:                Barney Drain, MD, Rosina Lowenstein, RN, Lurline Del, RN Referring MD:             DR. Talbert Cage Medicines:                Promethazine 12.5 mg IV, Meperidine 25 mg IV,                            Midazolam 4 mg IV Complications:            No immediate complications. Estimated Blood Loss:     Estimated blood loss: none. Estimated blood loss:                            none. Procedure:                Pre-Anesthesia Assessment:                           - Prior to the procedure, a History and Physical                            was performed, and patient medications and                            allergies were reviewed. The patient's tolerance of                            previous anesthesia was also reviewed. The risks                            and benefits of the procedure and the sedation                            options and risks were discussed with the patient.                            All questions were answered, and informed consent                            was obtained. Prior Anticoagulants: The patient has                            taken aspirin. ASA Grade Assessment: II - A patient                            with mild systemic disease. After reviewing the  risks and benefits, the patient was deemed in                            satisfactory condition to undergo the procedure.                            After obtaining informed consent, the colonoscope                            was passed under direct vision. Throughout the                            procedure, the patient's blood pressure, pulse, and                            oxygen saturations  were monitored continuously. The                            EC-[id number] ([id number]) scope was introduced through                            the anus and advanced to the 10 cm into the ileum.                            The colonoscopy was somewhat difficult due to a                            tortuous colon. Successful completion of the                            procedure was aided by COLOWRAP. The patient                            tolerated the procedure well. The quality of the                            bowel preparation was good. The terminal ileum,                            ileocecal valve, appendiceal orifice, and rectum                            were photographed. Scope In: 8:49:50 AM Scope Out: 9:04:59 AM Scope Withdrawal Time: 0 hours 11 minutes 53 seconds  Total Procedure Duration: 0 hours 15 minutes 9 seconds  Findings:      The digital rectal exam findings include non-thrombosed external       hemorrhoids.      Multiple small and large-mouthed diverticula were found in the sigmoid       colon and descending colon.      The recto-sigmoid colon and sigmoid colon were moderately redundant.      Non-bleeding external and internal hemorrhoids were found during       retroflexion.      The terminal ileum appeared normal.  Two small angioectasias without bleeding were found in the proximal       ascending colon. Coagulation for bleeding prevention using argon plasma       at 0.5 liters/minute and 20 watts was successful. Impression:               - Non-thrombosed external hemorrhoids found on                            digital rectal exam.                           - Diverticulosis in the sigmoid colon and in the                            descending colon.                           - Redundant colon.                           - Non-bleeding external and internal hemorrhoids.                           - The examined portion of the ileum was normal. Moderate Sedation:       Moderate (conscious) sedation was administered by the endoscopy nurse       and supervised by the endoscopist. The following parameters were       monitored: oxygen saturation, heart rate, blood pressure, and response       to care. Total physician intraservice time was 23 minutes. Recommendation:           - High fiber diet.                           - Continue present medications. GIVENS CAPSULE TO                            COMPLE EVALUATION FOR FEDA                           - Repeat colonoscopy 10-15 YEARS IF THE BENEFITS                            OUTWEIGH THE RISKS for surveillance.                           - Return to GI office in 5 months.                           - Patient has a contact number available for                            emergencies. The signs and symptoms of potential                            delayed complications were discussed with the  patient. Return to normal activities tomorrow.                            Written discharge instructions were provided to the                            patient. Procedure Code(s):        --- Professional ---                           [phone], Colonoscopy, flexible; with control of                            bleeding, any method                           99152, Moderate sedation services provided by the                            same physician or other qualified health care                            professional performing the diagnostic or                            therapeutic service that the sedation supports,                            requiring the presence of an independent trained                            observer to assist in the monitoring of the                            patient's level of consciousness and physiological                            status; initial 15 minutes of intraservice time,                            patient age 46 years or older                           [phone],  Moderate sedation services; each additional                            15 minutes intraservice time Diagnosis Code(s):        --- Professional ---                           K64.4, Residual hemorrhoidal skin tags                           K64.8, Other hemorrhoids  D64.9, Anemia, unspecified                           K57.30, Diverticulosis of large intestine without                            perforation or abscess without bleeding                           Q43.8, Other specified congenital malformations of                            intestine CPT copyright 2016 American Medical Association. All rights reserved. The codes documented in this report are preliminary and upon coder review may  be revised to meet current compliance requirements. Barney Drain, MD Barney Drain, MD 05/14/2016 9:29:25 AM This report has been signed electronically. Number of Addenda: 0

## 2016-05-14 NOTE — Discharge Instructions (Signed)
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 I PUT HEAT ON IT AND MADE IT DISAPPEAR. You DID NOT HAVE ANY POLYPS. YOU HAVE DIVERTICULOSIS IN YOUR LEFT COLON. You have external hemorrhoids.    I confirmed with Oncology you have an appt on Mar 16  @9  am for labs and @930  am with DR. ZHOU ( pronounced "ZOW")  DRINK WATER TO KEEP URINE LIGHT YELLOW.  FOLLOW A HIGH FIBER DIET. AVOID ITEMS THAT CAUSE BLOATING & GAS. SEE INFO BELOW.  YOU SHOULD COMPLETE A GIVENS CAPSULE ENDOSCOPY WITHIN THE NEXT 2 WEEKS TO COMPLETE YOUR WORKUP FOR LOW BLOOD COUNT/LOW IRON.  USE PROCTOCREAM FOUR TIMES  A DAY FOR 10 DAYS TO RELIEVE RECTAL PAIN/PRESSURE/BLEEDING.  FOLLOW UP IN AUG 2018.  NEXT COLONOSCOPY IN 10-15 YEARS IF THE BENEFITS OUTWEIGH THE RISKS.      ENDOSCOPY Care After Read the instructions outlined below and refer to this sheet in the next week. These discharge instructions provide you with general information on caring for yourself after you leave the hospital. While your treatment has been planned according to the most current medical practices available, unavoidable complications occasionally occur. If you have any problems or questions after discharge, call DR. Mareta Chesnut, [phone].  ACTIVITY  You may resume your regular activity, but move at a slower pace for the next 24 hours.   Take frequent rest periods for the next 24 hours.   Walking will help get rid of the air and reduce the bloated feeling in your belly (abdomen).   No driving for 24 hours (because of the medicine (anesthesia) used during the test).   You may shower.   Do not sign any important legal documents or operate any machinery for 24 hours (because of the anesthesia used during the test).    NUTRITION  Drink plenty of fluids.   You may resume your normal diet as instructed by your doctor.   Begin with a light meal and progress to your normal diet. Heavy or fried foods are harder to digest and  may make you feel sick to your stomach (nauseated).   Avoid alcoholic beverages for 24 hours or as instructed.    MEDICATIONS  You may resume your normal medications.   WHAT YOU CAN EXPECT TODAY  Some feelings of bloating in the abdomen.   Passage of more gas than usual.   Spotting of blood in your stool or on the toilet paper  .  IF YOU HAD POLYPS REMOVED DURING THE COLONOSCOPY:  Eat a soft diet IF YOU HAVE NAUSEA, BLOATING, ABDOMINAL PAIN, OR VOMITING.    FINDING OUT THE RESULTS OF YOUR TEST Not all test results are available during your visit. DR. Oneida Alar WILL CALL YOU WITHIN 7 DAYS OF YOUR PROCEDUE WITH YOUR RESULTS. Do not assume everything is normal if you have not heard from DR. Teniyah Seivert IN ONE WEEK, CALL HER OFFICE AT [phone].  SEEK IMMEDIATE MEDICAL ATTENTION AND CALL THE OFFICE: [phone] IF:  You have more than a spotting of blood in your stool.   Your belly is swollen (abdominal distention).   You are nauseated or vomiting.   You have a temperature over 101F.   You have abdominal pain or discomfort that is severe or gets worse throughout the day.  Arteriovenous Malformation An arteriovenous malformation (AVM) is a disorder that has been present since birth (congenital). It is characterized by a complex, tangled web of arteries and veins. An AVM may occur in the STOMACH, COLON, OR  SMALL BOWEL.  SYMPTOMS  The most common problems (symptoms) of AVM include:  Bleeding (hemorrhaging).   ANEMIA  TREATMENT  The treatment for AVMs: **ABLATION WITH HEAT    High-Fiber Diet A high-fiber diet changes your normal diet to include more whole grains, legumes, fruits, and vegetables. Changes in the diet involve replacing refined carbohydrates with unrefined foods. The calorie level of the diet is essentially unchanged. The Dietary Reference Intake (recommended amount) for adult males is 38 grams per day. For adult females, it is 25 grams per day. Pregnant and  lactating women should consume 28 grams of fiber per day.Fiber is the intact part of a plant that is not broken down during digestion. Functional fiber is fiber that has been isolated from the plant to provide a beneficial effect in the body.  PURPOSE  Increase stool bulk.   Ease and regulate bowel movements.   Lower cholesterol.   REDUCE RISK OF COLON CANCER  INDICATIONS THAT YOU NEED MORE FIBER  Constipation and hemorrhoids.   Uncomplicated diverticulosis (intestine condition) and irritable bowel syndrome.   Weight management.   As a protective measure against hardening of the arteries (atherosclerosis), diabetes, and cancer.   GUIDELINES FOR INCREASING FIBER IN THE DIET  Start adding fiber to the diet slowly. A gradual increase of about 5 more grams (2 slices of whole-wheat bread, 2 servings of most fruits or vegetables, or 1 bowl of high-fiber cereal) per day is best. Too rapid an increase in fiber may result in constipation, flatulence, and bloating.   Drink enough water and fluids to keep your urine clear or pale yellow. Water, juice, or caffeine-free drinks are recommended. Not drinking enough fluid may cause constipation.   Eat a variety of high-fiber foods rather than one type of fiber.   Try to increase your intake of fiber through using high-fiber foods rather than fiber pills or supplements that contain small amounts of fiber.   The goal is to change the types of food eaten. Do not supplement your present diet with high-fiber foods, but replace foods in your present diet.   INCLUDE A VARIETY OF FIBER SOURCES  Replace refined and processed grains with whole grains, canned fruits with fresh fruits, and incorporate other fiber sources. White rice, white breads, and most bakery goods contain little or no fiber.   Brown whole-grain rice, buckwheat oats, and many fruits and vegetables are all good sources of fiber. These include: broccoli, Brussels sprouts, cabbage,  cauliflower, beets, sweet potatoes, white potatoes (skin on), carrots, tomatoes, eggplant, squash, berries, fresh fruits, and dried fruits.   Cereals appear to be the richest source of fiber. Cereal fiber is found in whole grains and bran. Bran is the fiber-rich outer coat of cereal grain, which is largely removed in refining. In whole-grain cereals, the bran remains. In breakfast cereals, the largest amount of fiber is found in those with "bran" in their names. The fiber content is sometimes indicated on the label.   You may need to include additional fruits and vegetables each day.   In baking, for 1 cup white flour, you may use the following substitutions:   1 cup whole-wheat flour minus 2 tablespoons.   1/2 cup white flour plus 1/2 cup whole-wheat flour.  Diverticulosis Diverticulosis is a common condition that develops when small pouches (diverticula) form in the wall of the colon. The risk of diverticulosis increases with age. It happens more often in people who eat a low-fiber diet. Most individuals with diverticulosis have  no symptoms. Those individuals with symptoms usually experience belly (abdominal) pain, constipation, or loose stools (diarrhea).  HOME CARE INSTRUCTIONS  Increase the amount of fiber in your diet as directed by your caregiver or dietician. This may reduce symptoms of diverticulosis.   Drink at least 6 to 8 glasses of water each day to prevent constipation.   Try not to strain when you have a bowel movement.   THERE IS NO NEED TO Avoid nuts and seeds to prevent complications.   FOODS HAVING HIGH FIBER CONTENT INCLUDE:  Fruits. Apple, peach, pear, tangerine, raisins, prunes.   Vegetables. Brussels sprouts, asparagus, broccoli, cabbage, carrot, cauliflower, romaine lettuce, spinach, summer squash, tomato, winter squash, zucchini.   Starchy Vegetables. Baked beans, kidney beans, lima beans, split peas, lentils, potatoes (with skin).   Grains. Whole wheat bread,  brown rice, bran flake cereal, plain oatmeal, white rice, shredded wheat, bran muffins.

## 2016-05-15 ENCOUNTER — Other Ambulatory Visit: Payer: Self-pay

## 2016-05-15 DIAGNOSIS — D509 Iron deficiency anemia, unspecified: Secondary | ICD-10-CM

## 2016-05-15 NOTE — Telephone Encounter (Signed)
Called and spoke to pt about Givens capsule study. She will talk to her husband and wants me to call her back later today.

## 2016-05-15 NOTE — Telephone Encounter (Signed)
Called pt. Givens capsule study scheduled for 05/26/16 at 7:30, arrive at 7:00am. Instructions given on phone and mailed.

## 2016-05-19 ENCOUNTER — Encounter (HOSPITAL_COMMUNITY): Payer: Self-pay | Admitting: Gastroenterology

## 2016-05-22 ENCOUNTER — Other Ambulatory Visit (HOSPITAL_COMMUNITY): Payer: Self-pay | Admitting: *Deleted

## 2016-05-22 DIAGNOSIS — D75839 Thrombocytosis, unspecified: Secondary | ICD-10-CM

## 2016-05-22 DIAGNOSIS — D473 Essential (hemorrhagic) thrombocythemia: Secondary | ICD-10-CM

## 2016-05-23 ENCOUNTER — Encounter (HOSPITAL_COMMUNITY): Payer: Medicare Other | Attending: Hematology & Oncology | Admitting: Hematology

## 2016-05-23 ENCOUNTER — Other Ambulatory Visit (HOSPITAL_COMMUNITY): Payer: Medicare Other

## 2016-05-23 ENCOUNTER — Ambulatory Visit (HOSPITAL_COMMUNITY): Payer: Medicare Other | Admitting: Hematology & Oncology

## 2016-05-23 ENCOUNTER — Encounter (HOSPITAL_COMMUNITY): Payer: Medicare Other

## 2016-05-23 ENCOUNTER — Encounter (HOSPITAL_COMMUNITY): Payer: Self-pay

## 2016-05-23 ENCOUNTER — Other Ambulatory Visit: Payer: Self-pay | Admitting: Family Medicine

## 2016-05-23 VITALS — BP 127/65 | HR 55 | Temp 97.6°F | Resp 20 | Wt 166.4 lb

## 2016-05-23 DIAGNOSIS — J449 Chronic obstructive pulmonary disease, unspecified: Secondary | ICD-10-CM | POA: Insufficient documentation

## 2016-05-23 DIAGNOSIS — I10 Essential (primary) hypertension: Secondary | ICD-10-CM | POA: Insufficient documentation

## 2016-05-23 DIAGNOSIS — Z880 Allergy status to penicillin: Secondary | ICD-10-CM | POA: Insufficient documentation

## 2016-05-23 DIAGNOSIS — Z90710 Acquired absence of both cervix and uterus: Secondary | ICD-10-CM | POA: Insufficient documentation

## 2016-05-23 DIAGNOSIS — E119 Type 2 diabetes mellitus without complications: Secondary | ICD-10-CM | POA: Diagnosis not present

## 2016-05-23 DIAGNOSIS — F419 Anxiety disorder, unspecified: Secondary | ICD-10-CM | POA: Diagnosis not present

## 2016-05-23 DIAGNOSIS — Z5189 Encounter for other specified aftercare: Secondary | ICD-10-CM | POA: Diagnosis not present

## 2016-05-23 DIAGNOSIS — E611 Iron deficiency: Secondary | ICD-10-CM | POA: Diagnosis not present

## 2016-05-23 DIAGNOSIS — Z7984 Long term (current) use of oral hypoglycemic drugs: Secondary | ICD-10-CM | POA: Diagnosis not present

## 2016-05-23 DIAGNOSIS — D473 Essential (hemorrhagic) thrombocythemia: Secondary | ICD-10-CM

## 2016-05-23 DIAGNOSIS — Z885 Allergy status to narcotic agent status: Secondary | ICD-10-CM | POA: Diagnosis not present

## 2016-05-23 DIAGNOSIS — D75839 Thrombocytosis, unspecified: Secondary | ICD-10-CM

## 2016-05-23 DIAGNOSIS — K219 Gastro-esophageal reflux disease without esophagitis: Secondary | ICD-10-CM | POA: Diagnosis not present

## 2016-05-23 DIAGNOSIS — Z8673 Personal history of transient ischemic attack (TIA), and cerebral infarction without residual deficits: Secondary | ICD-10-CM | POA: Diagnosis not present

## 2016-05-23 DIAGNOSIS — Z87891 Personal history of nicotine dependence: Secondary | ICD-10-CM | POA: Diagnosis not present

## 2016-05-23 DIAGNOSIS — Z79899 Other long term (current) drug therapy: Secondary | ICD-10-CM | POA: Insufficient documentation

## 2016-05-23 DIAGNOSIS — C946 Myelodysplastic disease, not classified: Secondary | ICD-10-CM

## 2016-05-23 DIAGNOSIS — Z7982 Long term (current) use of aspirin: Secondary | ICD-10-CM | POA: Insufficient documentation

## 2016-05-23 DIAGNOSIS — Z1589 Genetic susceptibility to other disease: Secondary | ICD-10-CM

## 2016-05-23 DIAGNOSIS — Z85828 Personal history of other malignant neoplasm of skin: Secondary | ICD-10-CM | POA: Diagnosis not present

## 2016-05-23 DIAGNOSIS — Z9889 Other specified postprocedural states: Secondary | ICD-10-CM | POA: Diagnosis not present

## 2016-05-23 DIAGNOSIS — D471 Chronic myeloproliferative disease: Secondary | ICD-10-CM | POA: Insufficient documentation

## 2016-05-23 LAB — CBC WITH DIFFERENTIAL/PLATELET
BASOS ABS: 0.1 10*3/uL (ref 0.0–0.1)
Basophils Relative: 1 %
EOS PCT: 7 %
Eosinophils Absolute: 0.5 10*3/uL (ref 0.0–0.7)
HEMATOCRIT: 45.1 % (ref 36.0–46.0)
HEMOGLOBIN: 14.6 g/dL (ref 12.0–15.0)
LYMPHS PCT: 25 %
Lymphs Abs: 1.9 10*3/uL (ref 0.7–4.0)
MCH: 31.3 pg (ref 26.0–34.0)
MCHC: 32.4 g/dL (ref 30.0–36.0)
MCV: 96.8 fL (ref 78.0–100.0)
MONOS PCT: 9 %
Monocytes Absolute: 0.7 10*3/uL (ref 0.1–1.0)
NEUTROS ABS: 4.5 10*3/uL (ref 1.7–7.7)
Neutrophils Relative %: 58 %
Platelets: 372 10*3/uL (ref 150–400)
RBC: 4.66 MIL/uL (ref 3.87–5.11)
RDW: 21.5 % — ABNORMAL HIGH (ref 11.5–15.5)
WBC: 7.7 10*3/uL (ref 4.0–10.5)

## 2016-05-23 LAB — COMPREHENSIVE METABOLIC PANEL
ALBUMIN: 4.2 g/dL (ref 3.5–5.0)
ALK PHOS: 53 U/L (ref 38–126)
ALT: 8 U/L — ABNORMAL LOW (ref 14–54)
AST: 15 U/L (ref 15–41)
Anion gap: 6 (ref 5–15)
BILIRUBIN TOTAL: 0.7 mg/dL (ref 0.3–1.2)
BUN: 18 mg/dL (ref 6–20)
CALCIUM: 9.2 mg/dL (ref 8.9–10.3)
CO2: 30 mmol/L (ref 22–32)
Chloride: 101 mmol/L (ref 101–111)
Creatinine, Ser: 0.87 mg/dL (ref 0.44–1.00)
GFR calc Af Amer: >60 mL/min (ref >60)
GFR calc non Af Amer: >60 mL/min (ref >60)
GLUCOSE: 89 mg/dL (ref 65–99)
Potassium: 4.4 mmol/L (ref 3.5–5.1)
Sodium: 137 mmol/L (ref 135–145)
TOTAL PROTEIN: 6.8 g/dL (ref 6.5–8.1)

## 2016-05-23 NOTE — Patient Instructions (Signed)
[place] at [facility] Discharge Instructions  RECOMMENDATIONS MADE BY THE CONSULTANT AND ANY TEST RESULTS WILL BE SENT TO YOUR REFERRING PHYSICIAN.  You were seen today by Dr. Irene Limbo Follow up in 2 months  See Amy up front for appointments   Thank you for choosing [place] at [facility] to provide your oncology and hematology care.  To afford each patient quality time with our provider, please arrive at least 15 minutes before your scheduled appointment time.    If you have a lab appointment with the [place] please come in thru the  Main Entrance and check in at the main information desk  You need to re-schedule your appointment should you arrive 10 or more minutes late.  We strive to give you quality time with our providers, and arriving late affects you and other patients whose appointments are after yours.  Also, if you no show three or more times for appointments you may be dismissed from the clinic at the providers discretion.     Again, thank you for choosing [facility].  Our hope is that these requests will decrease the amount of time that you wait before being seen by our physicians.       _____________________________________________________________  Should you have questions after your visit to [facility], please contact our office at [phone] between the hours of 8:30 a.m. and 4:30 p.m.  Voicemails left after 4:30 p.m. will not be returned until the following business day.  For prescription refill requests, have your pharmacy contact our office.       Resources For Cancer Patients and their Caregivers ? American Cancer Society: Can assist with transportation, wigs, general needs, runs Look Good Feel Better.        302-660-9445 ? Cancer Care: Provides financial assistance, online support groups, medication/co-pay assistance.  1-800-813-HOPE 715-290-6390) ? [place] Assists [place] cancer patients and their families through emotional , educational and financial support.  878-409-4243 ? [place] DSS Where to apply for food stamps, Medicaid and utility assistance. 212-182-9737 ? [facility]: Transportation to medical appointments. (808)654-5677 ? Social Security Administration: May apply for disability if have a Stage IV cancer. (845) 632-2450 (317)522-1304 ? [employer], Disability and Transit Services: Assists with nutrition, care and transit needs. [place] Support Programs: @10RELATIVEDAYS @ > Cancer Support Group  2nd Tuesday of the month 1pm-2pm, Journey Room  > Creative Journey  3rd Tuesday of the month 1130am-1pm, Journey Room  > Look Good Feel Better  1st Wednesday of the month 10am-12 noon, Journey Room (Call [place] to register [phone])

## 2016-05-24 NOTE — Progress Notes (Signed)
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 She continues to be compliant with hydroxyurea. Notes no acute new symptoms. No new blood clots. Energy levels are stable. Labs show normalization of platelet counts at 372k. No anemia no leukocytosis. No residual symptoms from CVA 5-6 years ago when she had presented with left-sided numbness and weakness. Continues to be on aspirin.  REVIEW OF SYSTEMS:    10 Point review of systems of done and is negative except as noted above.  . Past Medical History:  Diagnosis Date  . Anxiety   . Arthritis   . Asthma   . Cancer [facility])    Skin cancer  . Cataract   . Cataracts, both eyes 05/2014  . COPD (chronic obstructive pulmonary disease) ([place])   . Depression   . Diabetes mellitus without complication ([place])   . GERD (gastroesophageal reflux disease)   . Hyperlipidemia   . Hypertension   . Seizures ([place])    From RH Negative bvlood when pregnant  . Stroke ([place])   . Thyroid disease     . Past Surgical History:  Procedure Laterality Date  . ABDOMINAL HYSTERECTOMY    . ANKLE SURGERY Right   . BREAST SURGERY     Bx, none malignant  . COLONOSCOPY  2013   normal per patient  . COLONOSCOPY N/A 05/14/2016   Procedure: COLONOSCOPY;  Surgeon: Danie Binder, MD;  [facility];  Service: Endoscopy;  Laterality: N/A;  8:30AM  . ESOPHAGOGASTRODUODENOSCOPY N/A 01/14/2016   Dr. Oneida Alar: normal esophagus, single hyperplastic gastric polyp, normal duodenum, negative  H.pylori  . ROTATOR CUFF REPAIR Right   . Skin Cancers    . TONSILLECTOMY    . TONSILLECTOMY      . Social History  Substance Use Topics  . Smoking status: Former Smoker    Packs/day: 3.00    Years: 36.00    Types: Cigarettes    Quit date: 03/10/2000  . Smokeless tobacco: Never Used  . Alcohol use No    ALLERGIES:  is allergic to bee venom; codeine; lipitor [atorvastatin]; penicillins; morphine and related; and tetracyclines & related.  MEDICATIONS:  Current Outpatient Prescriptions  Medication Sig Dispense Refill  . albuterol (PROVENTIL HFA;VENTOLIN HFA) 108 (90 Base) MCG/ACT inhaler Inhale 2 puffs into the lungs every 4 (four) hours as needed for wheezing or shortness of breath. 1 Inhaler 3  . aspirin EC 81 MG tablet Take 81 mg by mouth daily.    . fexofenadine-pseudoephedrine (ALLEGRA-D 24) 180-240 MG 24 hr tablet Take 1 tablet by mouth daily as needed (allergies).    . gabapentin (NEURONTIN) 300 MG capsule Take 300 mg by mouth at bedtime.   3  . GAVILYTE-N WITH FLAVOR PACK 420 g solution TAKE 4,000 MLS BY MOUTH AS DIRECTED.  0  . hydroxyurea (HYDREA) 500 MG capsule Take 1000 mg on Mon-Wed-Fri-Sat and 500 mg Tue-Thur-Sun.  May take with food to minimize GI side effects. 44 capsule 1  . levothyroxine (SYNTHROID, LEVOTHROID) 112 MCG tablet TAKE 1 TABLET (112 MCG TOTAL) BY MOUTH DAILY. 90 tablet 3  .  metFORMIN (GLUCOPHAGE) 500 MG tablet Take 1 tablet (500 mg total) by mouth 2 (two) times daily with a meal. 60 tablet 0  . metoprolol succinate (TOPROL-XL) 25 MG 24 hr tablet TAKE 1 TABLET (25 MG TOTAL) BY MOUTH DAILY. 90 tablet 0  . Multiple Vitamins-Minerals (ICAPS AREDS 2 PO) Take 1 capsule by mouth 2 (two) times daily.    Jordan [name] omeprazole (PRILOSEC) 20 MG capsule 1 po every morning 30 minutes prior to your first meal. (Patient taking differently: Take 20 mg by mouth daily. every morning 30 minutes prior to your first meal.) 31 capsule 11  . PARoxetine (PAXIL) 20 MG tablet TAKE 1 TABLET  (20 MG TOTAL) BY MOUTH DAILY. 30 tablet 0  . pramoxine-hydrocortisone (PROCTOCREAM-HC) 1-1 % rectal cream USE PR 4 TIMES A DAY FOR 10 DAYS. 30 g 0  . promethazine (PHENERGAN) 12.5 MG tablet Take 1 tablet (12.5 mg total) by mouth every 8 (eight) hours as needed for nausea or vomiting. 30 tablet 0  . ranitidine (ZANTAC) 150 MG tablet TAKE 1 TABLET (150 MG TOTAL) BY MOUTH 2 (TWO) TIMES DAILY.  2   No current facility-administered medications for this visit.     PHYSICAL EXAMINATION: ECOG PERFORMANCE STATUS: 2 - Symptomatic, <50% confined to bed  . Vitals:   05/23/16 0931  BP: 127/65  Pulse: (!) 55  Resp: 20  Temp: 97.6 F (36.4 C)    Filed Weights   05/23/16 0931  Weight: 166 lb 6.4 oz (75.5 kg)   .Body mass index is 26.86 kg/m.  GENERAL:alert, in no acute distress and comfortable SKIN: skin color, texture, turgor are normal, no rashes or significant lesions EYES: normal, conjunctiva are pink and non-injected, sclera clear OROPHARYNX:no exudate, no erythema and lips, buccal mucosa, and tongue normal  NECK: supple, no JVD, thyroid normal size, non-tender, without nodularity LYMPH:  no palpable lymphadenopathy in the cervical, axillary or inguinal LUNGS: clear to auscultation with normal respiratory effort HEART: regular rate & rhythm,  no murmurs and no lower extremity edema ABDOMEN: abdomen soft, non-tender, normoactive bowel sounds  Musculoskeletal: no cyanosis of digits and no clubbing  PSYCH: alert & oriented x 3 with fluent speech NEURO: no overt focal motor/sensory deficits  LABORATORY DATA:   I have reviewed the data as listed  . CBC Latest Ref Rng & Units 05/23/2016 04/15/2016 03/25/2016  WBC 4.0 - 10.5 K/uL 7.7 7.1 8.1  Hemoglobin 12.0 - 15.0 g/dL 14.6 14.9 14.4  Hematocrit 36.0 - 46.0 % 45.1 46.0 46.1(H)  Platelets 150 - 400 K/uL 372 475(H) 598(H)    . CMP Latest Ref Rng & Units 05/23/2016 04/15/2016 03/25/2016  Glucose 65 - 99 mg/dL 89 105(H) 119(H)  BUN 6 - 20  mg/dL 18 16 17   Creatinine 0.44 - 1.00 mg/dL 0.87 0.97 0.94  Sodium 135 - 145 mmol/L 137 140 137  Potassium 3.5 - 5.1 mmol/L 4.4 5.3(H) 5.1  Chloride 101 - 111 mmol/L 101 103 100(L)  CO2 22 - 32 mmol/L 30 29 27   Calcium 8.9 - 10.3 mg/dL 9.2 9.4 9.4  Total Protein 6.5 - 8.1 g/dL 6.8 6.8 7.1  Total Bilirubin 0.3 - 1.2 mg/dL 0.7 0.8 0.9  Alkaline Phos 38 - 126 U/L 53 62 50  AST 15 - 41 U/L 15 17 17   ALT 14 - 54 U/L 8(L) 10(L) 7(L)     RADIOGRAPHIC STUDIES: I have personally reviewed the radiological images as listed and agreed with the findings in the report. No results found.  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 Patient's counts have now normalized. No overt toxicity with hydroxyurea. Other blood counts stable Plan -Continue hydroxyurea 1000 mg Monday, Wednesday, Friday, and Saturday as per current dose. -Continue daily aspirin -Counseled on sun protection  #2 Iron deficiency  -Colonoscopy 05/14/2016 - - Non-thrombosed external hemorrhoids found on digital rectal exam. - Diverticulosis in the sigmoid colon and in the descending colon. - Redundant colon. - Non-bleeding external and internal hemorrhoids. - The examined portion of the ileum was normal.  EGD 11/6/018 - Normal esophagus. - A single gastric polyp. Resected and retrieved. Clips (MR conditional) were placed. - Normal examined duodenum. Biopsied. - DYSPEPSIA MOST LIKELY DUE TO GERD/GASTRITIS  PLAN  - no bleeding currently and no anemia.  -Would not replace iron too aggressively unless patient is overtly anemic . Since she might have a tendency to his polycythemia with her Jak2V61F mutation.   Return to clinic in 2 months with repeat labs for continued management.  I spent 20 minutes counseling the patient face to face. The total time spent in the appointment was 25 minutes and more than 50% was on counseling and  direct patient cares.    Sullivan Lone MD [place] AAHIVMS [facility] Hematology/Oncology [facility]  (Office):       [phone] (Work cell):  [phone] (Fax):           [phone]

## 2016-05-26 ENCOUNTER — Encounter (HOSPITAL_COMMUNITY)
Admission: RE | Disposition: A | Discharge status: Home / Self Care | Payer: Self-pay | Source: Ambulatory Visit | Attending: Gastroenterology

## 2016-05-26 ENCOUNTER — Telehealth: Payer: Self-pay

## 2016-05-26 ENCOUNTER — Ambulatory Visit (HOSPITAL_COMMUNITY)
Admission: RE | Admit: 2016-05-26 | Discharge: 2016-05-26 | Disposition: A | Discharge status: Home / Self Care | Payer: Medicare Other | Source: Ambulatory Visit | Attending: Gastroenterology | Admitting: Gastroenterology

## 2016-05-26 SURGERY — IMAGING PROCEDURE, GI TRACT, INTRALUMINAL, VIA CAPSULE

## 2016-05-26 NOTE — Telephone Encounter (Signed)
Melanie from Endo called this morning and said that pt came for Givens this morning and she is having problems swallowing. Pt was seen by ENT and will be going to [facility], she has a hole in her voice box. Threasa Beards was unable to contact Dr. Oneida Alar. She got in touch with Dr. Gala Romney and he advised it would be ok to reschedule Givens.  Tried to call pt, LMOVM and informed pt to call our office when she is ready to reschedule Givens capsule.

## 2016-05-26 NOTE — OR Nursing (Signed)
Patient in for a Givens capsule study. Patient explains that she is having trouble swallowing and saw an ENT last week and was diagnosed with a hole in her voicebox. Patient has been referred to [facility] to have this issue addressed on 3/25. Patient stated she would like to have Givens rescheduled until after her referral. Attempted to contact Dr. Oneida Alar without success. Notified Dr. Gala Romney who said it was ok for patient to reschedule Givens capsule for a later date. Tretha Sciara at the office notified. Patient to call the office when she is ready to reschedule procedure.

## 2016-05-26 NOTE — Telephone Encounter (Signed)
REVIEWED. CONTACT PT IN 2-3 WEEKS TO RESCHEDULE.

## 2016-06-04 ENCOUNTER — Other Ambulatory Visit: Payer: Self-pay | Admitting: Family Medicine

## 2016-06-04 DIAGNOSIS — J384 Edema of larynx: Secondary | ICD-10-CM | POA: Diagnosis not present

## 2016-06-04 DIAGNOSIS — Z87891 Personal history of nicotine dependence: Secondary | ICD-10-CM | POA: Diagnosis not present

## 2016-06-04 DIAGNOSIS — J385 Laryngeal spasm: Secondary | ICD-10-CM | POA: Diagnosis not present

## 2016-06-04 DIAGNOSIS — J383 Other diseases of vocal cords: Secondary | ICD-10-CM | POA: Diagnosis not present

## 2016-06-04 DIAGNOSIS — R490 Dysphonia: Secondary | ICD-10-CM | POA: Diagnosis not present

## 2016-06-08 DIAGNOSIS — J383 Other diseases of vocal cords: Secondary | ICD-10-CM | POA: Insufficient documentation

## 2016-06-08 DIAGNOSIS — R490 Dysphonia: Secondary | ICD-10-CM | POA: Insufficient documentation

## 2016-06-17 NOTE — Telephone Encounter (Signed)
Called pt to reschedule Jordan [name]. She said she went to a specialist at [facility] about tear in larynx. She prefers to wait for now on Jordan [name]. States she has had a lot going on and she will call our office when she is ready to schedule Jordan [name]. Informed her I would let [facility] know.

## 2016-06-17 NOTE — Telephone Encounter (Signed)
REVIEWED-NO ADDITIONAL RECOMMENDATIONS. 

## 2016-06-23 ENCOUNTER — Other Ambulatory Visit: Payer: Self-pay | Admitting: Family Medicine

## 2016-06-24 ENCOUNTER — Other Ambulatory Visit: Payer: Self-pay | Admitting: Family Medicine

## 2016-06-24 ENCOUNTER — Other Ambulatory Visit (HOSPITAL_COMMUNITY): Payer: Self-pay | Admitting: Oncology

## 2016-06-24 ENCOUNTER — Other Ambulatory Visit: Payer: Self-pay | Admitting: Physician Assistant

## 2016-06-24 DIAGNOSIS — D471 Chronic myeloproliferative disease: Secondary | ICD-10-CM

## 2016-07-10 ENCOUNTER — Other Ambulatory Visit: Payer: Self-pay | Admitting: Family Medicine

## 2016-07-10 NOTE — Telephone Encounter (Signed)
Authorize 30 days only. Then contact the patient letting them know that they will need an appointment before any further prescriptions can be sent in. 

## 2016-07-10 NOTE — Telephone Encounter (Signed)
Attempted to contact patient - NA °

## 2016-07-20 ENCOUNTER — Other Ambulatory Visit: Payer: Self-pay | Admitting: Family

## 2016-07-24 ENCOUNTER — Encounter (HOSPITAL_COMMUNITY): Payer: Self-pay

## 2016-07-24 ENCOUNTER — Encounter (HOSPITAL_COMMUNITY): Payer: Medicare Other | Attending: Oncology | Admitting: Oncology

## 2016-07-24 ENCOUNTER — Encounter (HOSPITAL_COMMUNITY): Payer: Medicare Other

## 2016-07-24 VITALS — BP 152/70 | HR 64 | Temp 97.9°F | Resp 18 | Wt 167.9 lb

## 2016-07-24 DIAGNOSIS — K219 Gastro-esophageal reflux disease without esophagitis: Secondary | ICD-10-CM | POA: Insufficient documentation

## 2016-07-24 DIAGNOSIS — I10 Essential (primary) hypertension: Secondary | ICD-10-CM | POA: Diagnosis not present

## 2016-07-24 DIAGNOSIS — Z7984 Long term (current) use of oral hypoglycemic drugs: Secondary | ICD-10-CM | POA: Insufficient documentation

## 2016-07-24 DIAGNOSIS — F419 Anxiety disorder, unspecified: Secondary | ICD-10-CM | POA: Insufficient documentation

## 2016-07-24 DIAGNOSIS — Z85828 Personal history of other malignant neoplasm of skin: Secondary | ICD-10-CM | POA: Diagnosis not present

## 2016-07-24 DIAGNOSIS — J449 Chronic obstructive pulmonary disease, unspecified: Secondary | ICD-10-CM | POA: Diagnosis not present

## 2016-07-24 DIAGNOSIS — Z8673 Personal history of transient ischemic attack (TIA), and cerebral infarction without residual deficits: Secondary | ICD-10-CM | POA: Insufficient documentation

## 2016-07-24 DIAGNOSIS — F329 Major depressive disorder, single episode, unspecified: Secondary | ICD-10-CM | POA: Insufficient documentation

## 2016-07-24 DIAGNOSIS — E079 Disorder of thyroid, unspecified: Secondary | ICD-10-CM | POA: Insufficient documentation

## 2016-07-24 DIAGNOSIS — D75839 Thrombocytosis, unspecified: Secondary | ICD-10-CM

## 2016-07-24 DIAGNOSIS — R531 Weakness: Secondary | ICD-10-CM | POA: Diagnosis not present

## 2016-07-24 DIAGNOSIS — E785 Hyperlipidemia, unspecified: Secondary | ICD-10-CM | POA: Diagnosis not present

## 2016-07-24 DIAGNOSIS — E119 Type 2 diabetes mellitus without complications: Secondary | ICD-10-CM | POA: Insufficient documentation

## 2016-07-24 DIAGNOSIS — D473 Essential (hemorrhagic) thrombocythemia: Secondary | ICD-10-CM | POA: Diagnosis not present

## 2016-07-24 DIAGNOSIS — D471 Chronic myeloproliferative disease: Secondary | ICD-10-CM

## 2016-07-24 DIAGNOSIS — Z7982 Long term (current) use of aspirin: Secondary | ICD-10-CM | POA: Diagnosis not present

## 2016-07-24 DIAGNOSIS — Z87891 Personal history of nicotine dependence: Secondary | ICD-10-CM | POA: Diagnosis not present

## 2016-07-24 DIAGNOSIS — C946 Myelodysplastic disease, not classified: Secondary | ICD-10-CM | POA: Insufficient documentation

## 2016-07-24 LAB — CBC WITH DIFFERENTIAL/PLATELET
BASOS ABS: 0.1 10*3/uL (ref 0.0–0.1)
BASOS PCT: 1 %
Eosinophils Absolute: 0.4 10*3/uL (ref 0.0–0.7)
Eosinophils Relative: 5 %
HEMATOCRIT: 42.4 % (ref 36.0–46.0)
HEMOGLOBIN: 13.8 g/dL (ref 12.0–15.0)
Lymphocytes Relative: 21 %
Lymphs Abs: 1.9 10*3/uL (ref 0.7–4.0)
MCH: 34.6 pg — ABNORMAL HIGH (ref 26.0–34.0)
MCHC: 32.5 g/dL (ref 30.0–36.0)
MCV: 106.3 fL — AB (ref 78.0–100.0)
MONO ABS: 0.9 10*3/uL (ref 0.1–1.0)
Monocytes Relative: 10 %
Neutro Abs: 5.8 10*3/uL (ref 1.7–7.7)
Neutrophils Relative %: 63 %
Platelets: 495 10*3/uL — ABNORMAL HIGH (ref 150–400)
RBC: 3.99 MIL/uL (ref 3.87–5.11)
RDW: 16.1 % — AB (ref 11.5–15.5)
WBC: 9.1 10*3/uL (ref 4.0–10.5)

## 2016-07-24 LAB — COMPREHENSIVE METABOLIC PANEL
ALK PHOS: 66 U/L (ref 38–126)
ALT: 9 U/L — ABNORMAL LOW (ref 14–54)
ANION GAP: 5 (ref 5–15)
AST: 15 U/L (ref 15–41)
Albumin: 4.3 g/dL (ref 3.5–5.0)
BILIRUBIN TOTAL: 1 mg/dL (ref 0.3–1.2)
BUN: 16 mg/dL (ref 6–20)
CALCIUM: 9.2 mg/dL (ref 8.9–10.3)
CO2: 31 mmol/L (ref 22–32)
Chloride: 100 mmol/L — ABNORMAL LOW (ref 101–111)
Creatinine, Ser: 0.88 mg/dL (ref 0.44–1.00)
Glucose, Bld: 116 mg/dL — ABNORMAL HIGH (ref 65–99)
POTASSIUM: 5.4 mmol/L — AB (ref 3.5–5.1)
Sodium: 136 mmol/L (ref 135–145)
TOTAL PROTEIN: 7.1 g/dL (ref 6.5–8.1)

## 2016-07-24 LAB — LACTATE DEHYDROGENASE: LDH: 163 U/L (ref 98–192)

## 2016-07-24 LAB — RETICULOCYTES
RBC.: 3.99 MIL/uL (ref 3.87–5.11)
RETIC COUNT ABSOLUTE: 91.8 10*3/uL (ref 19.0–186.0)
Retic Ct Pct: 2.3 % (ref 0.4–3.1)

## 2016-07-24 MED ORDER — HYDROXYUREA 500 MG PO CAPS: ORAL_CAPSULE | ORAL | 2 refills | Status: DC

## 2016-07-24 NOTE — Patient Instructions (Signed)
[facility] at [facility] Discharge Instructions  RECOMMENDATIONS MADE BY THE CONSULTANT AND ANY TEST RESULTS WILL BE SENT TO YOUR REFERRING PHYSICIAN.  You were seen today by Dr. Louise Zhou    Thank you for choosing [facility] at [facility] to provide your oncology and hematology care.  To afford each patient quality time with our provider, please arrive at least 15 minutes before your scheduled appointment time.    If you have a lab appointment with the Cancer Center please come in thru the  Main Entrance and check in at the main information desk  You need to re-schedule your appointment should you arrive 10 or more minutes late.  We strive to give you quality time with our providers, and arriving late affects you and other patients whose appointments are after yours.  Also, if you no show three or more times for appointments you may be dismissed from the clinic at the providers discretion.     Again, thank you for choosing [facility].  Our hope is that these requests will decrease the amount of time that you wait before being seen by our physicians.       _____________________________________________________________  Should you have questions after your visit to  Cancer Center, please contact our office at [phone] between the hours of 8:30 a.m. and 4:30 p.m.  Voicemails left after 4:30 p.m. will not be returned until the following business day.  For prescription refill requests, have your pharmacy contact our office.       Resources For Cancer Patients and their Caregivers ? American Cancer Society: Can assist with transportation, wigs, general needs, runs Look Good Feel Better.        1-888-227-6333 ? Cancer Care: Provides financial assistance, online support groups, medication/co-pay assistance.  1-800-813-HOPE (4673) ? [facility] Assists [place] cancer patients and their  families through emotional , educational and financial support.  336-427-4357 ? [place] DSS Where to apply for food stamps, Medicaid and utility assistance. 336-342-1394 ? [facility]: Transportation to medical appointments. 336-347-2287 ? Social Security Administration: May apply for disability if have a Stage IV cancer. 336-342-7796 1-800-772-1213 ? [place] Aging, Disability and Transit Services: Assists with nutrition, care and transit needs. 336-349-2343  Cancer Center Support Programs: @[email or web address]@ > Cancer Support Group  2nd Tuesday of the month 1pm-2pm, Journey Room  > Creative Journey  3rd Tuesday of the month 1130am-1pm, Journey Room  > Look Good Feel Better  1st Wednesday of the month 10am-12 noon, Journey Room (Call American Cancer Society to register 1-800-395-5775)    

## 2016-07-24 NOTE — Progress Notes (Signed)
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 I personally reviewed and went over labs with the patient.  She continues to be compliant with hydroxyurea and takes it Monday, Wed, Friday, and Saturday BID. Notes no acute new symptoms. No new blood clots.  Lab results from today show  platelet counts at  495 K. No anemia. Continues to be on aspirin.  She states that she doesn't have any energy. She is unsure if taking any vitamins will help. She reports using a wheelchair today because she has a kink in her neck. She also notes bone spurs in her neck. She notes accompanying headaches. She denies any residual side effects from the stroke. Denies falls.   Patient does not have any other questions or concerns at this time.    REVIEW OF SYSTEMS:   Review of Systems  Constitutional: Positive for malaise/fatigue.  HENT: Negative.   Eyes: Negative.   Respiratory: Negative.   Cardiovascular: Negative.   Gastrointestinal: Negative.   Genitourinary: Negative.   Musculoskeletal: Positive for neck pain. Negative for falls.  Skin: Negative.   Neurological: Positive for headaches.  Endo/Heme/Allergies: Negative.   Psychiatric/Behavioral: Negative.   All other systems reviewed and are negative.  14 Point review of systems of done and is negative except as noted above.  . Past Medical History:  Diagnosis Date  . Anxiety   . Arthritis   . Asthma   . Cancer [facility])    Skin cancer  . Cataract   . Cataracts, both eyes  05/2014  . COPD (chronic obstructive pulmonary disease) ([place])   . Depression   . Diabetes mellitus without complication ([place])   . GERD (gastroesophageal reflux disease)   . Hyperlipidemia   . Hypertension   . Seizures ([place])    From RH Negative bvlood when pregnant  . Stroke ([place])   . Thyroid disease     . Past Surgical History:  Procedure Laterality Date  . ABDOMINAL HYSTERECTOMY    . ANKLE SURGERY Right   . BREAST SURGERY     Bx, none malignant  . COLONOSCOPY  2013   normal per patient  . COLONOSCOPY N/A 05/14/2016   Procedure: COLONOSCOPY;  Surgeon: Danie Binder, MD;  [facility];  Service: Endoscopy;  Laterality: N/A;  8:30AM  . ESOPHAGOGASTRODUODENOSCOPY N/A 01/14/2016   Dr. Oneida Alar: normal esophagus, single hyperplastic gastric polyp, normal duodenum, negative H.pylori  . ROTATOR CUFF REPAIR Right   . Skin Cancers    . TONSILLECTOMY    . TONSILLECTOMY      . Social History  Substance Use Topics  . Smoking status: Former Smoker    Packs/day: 3.00    Years: 36.00    Types: Cigarettes    Quit date: 03/10/2000  . Smokeless tobacco: Never Used  . Alcohol use No    ALLERGIES:  is allergic to bee venom; codeine; lipitor [atorvastatin]; penicillins; demeclocycline; morphine and related; and tetracyclines & related.  MEDICATIONS:  Current Outpatient Prescriptions  Medication Sig Dispense Refill  . aspirin EC 81 MG tablet Take 81 mg by mouth daily.    Marland Kitchen  fexofenadine-pseudoephedrine (ALLEGRA-D 24) 180-240 MG 24 hr tablet Take 1 tablet by mouth daily as needed (allergies).    . gabapentin (NEURONTIN) 300 MG capsule TAKE 1 CAPSULE (300 MG TOTAL) BY MOUTH AT BEDTIME. 30 capsule 0  . hydroxyurea (HYDREA) 500 MG capsule TAKE 2 CAPSULES ON MON-WED-FRI-SAT AND 1 CAPSULE ON TUE-THUR-SUN. MAY TAKE WITH FOOD 44 capsule 1  . levothyroxine (SYNTHROID, LEVOTHROID) 112 MCG tablet TAKE 1 TABLET (112 MCG TOTAL) BY MOUTH DAILY. 90 tablet 0  . metFORMIN (GLUCOPHAGE) 500 MG tablet  Take 1 tablet (500 mg total) by mouth 2 (two) times daily with a meal. 60 tablet 0  . metoprolol succinate (TOPROL-XL) 25 MG 24 hr tablet TAKE 1 TABLET (25 MG TOTAL) BY MOUTH DAILY. 90 tablet 0  . Multiple Vitamins-Minerals (ICAPS AREDS 2 PO) Take 1 capsule by mouth 2 (two) times daily.    Marland Kitchen omeprazole (PRILOSEC) 20 MG capsule 1 po every morning 30 minutes prior to your first meal. (Patient taking differently: Take 20 mg by mouth daily. every morning 30 minutes prior to your first meal.) 31 capsule 11  . PARoxetine (PAXIL) 20 MG tablet TAKE 1 TABLET (20 MG TOTAL) BY MOUTH DAILY. 30 tablet 0  . pramoxine-hydrocortisone (PROCTOCREAM-HC) 1-1 % rectal cream USE PR 4 TIMES A DAY FOR 10 DAYS. 30 g 0  . promethazine (PHENERGAN) 12.5 MG tablet Take 1 tablet (12.5 mg total) by mouth every 8 (eight) hours as needed for nausea or vomiting. 30 tablet 0  . VENTOLIN HFA 108 (90 Base) MCG/ACT inhaler INHALE 2 PUFFS INTO THE LUNGS EVERY 4 (FOUR) HOURS AS NEEDED FOR WHEEZING OR SHORTNESS OF BREATH. 18 Inhaler 0   No current facility-administered medications for this visit.     PHYSICAL EXAMINATION: ECOG PERFORMANCE STATUS: 2 - Symptomatic, <50% confined to bed  . Vitals:   07/24/16 0946  BP: (!) 152/70  Pulse: 64  Resp: 18  Temp: 97.9 F (36.6 C)   Filed Weights   07/24/16 0946  Weight: 167 lb 14.4 oz (76.2 kg)      Physical Exam  Constitutional: She is oriented to person, place, and time and well-developed, well-nourished, and in no distress.  HENT:  Head: Normocephalic and atraumatic.  Eyes: Conjunctivae and EOM are normal. Pupils are equal, round, and reactive to light.  Neck: Normal range of motion. Neck supple.  Cardiovascular: Normal rate, regular rhythm and normal heart sounds.   Pulmonary/Chest: Effort normal and breath sounds normal.  Abdominal: Soft. Bowel sounds are normal.  Musculoskeletal: Normal range of motion.  Neurological: She is alert and oriented to person, place, and  time. Gait normal.  Skin: Skin is warm and dry.  Nursing note and vitals reviewed.    LABORATORY DATA:   I have reviewed the data as listed  . CBC Latest Ref Rng & Units 07/24/2016 05/23/2016 04/15/2016  WBC 4.0 - 10.5 K/uL 9.1 7.7 7.1  Hemoglobin 12.0 - 15.0 g/dL 13.8 14.6 14.9  Hematocrit 36.0 - 46.0 % 42.4 45.1 46.0  Platelets 150 - 400 K/uL 495(H) 372 475(H)    . CMP Latest Ref Rng & Units 07/24/2016 05/23/2016 04/15/2016  Glucose 65 - 99 mg/dL 116(H) 89 105(H)  BUN 6 - 20 mg/dL 16 18 16   Creatinine 0.44 - 1.00 mg/dL 0.88 0.87 0.97  Sodium 135 - 145 mmol/L 136 137 140  Potassium 3.5 - 5.1 mmol/L 5.4(H) 4.4 5.3(H)  Chloride 101 - 111 mmol/L 100(L) 101 103  CO2 22 - 32 mmol/L 31 30  29  Calcium 8.9 - 10.3 mg/dL 9.2 9.2 9.4  Total Protein 6.5 - 8.1 g/dL 7.1 6.8 6.8  Total Bilirubin 0.3 - 1.2 mg/dL 1.0 0.7 0.8  Alkaline Phos 38 - 126 U/L 66 53 62  AST 15 - 41 U/L 15 15 17   ALT 14 - 54 U/L 9(L) 8(L) 10(L)     RADIOGRAPHIC STUDIES: I have personally reviewed the radiological images as listed and agreed with the findings in the report. No results found.  ASSESSMENT & PLAN:  Jak2 V617F Essential Thrombocytosis.  PLAN: Labs reviewed. Results noted above.   Patient platelet count was 495K today. I discussed with her that the goal is to keep her platelet count below 450. Therefore, I have told her to take the hydrea for an extra day of the week. She will take it 500mg  PO BID on Monday, Tuesday, Wednesday, Friday, and Saturday.   Patient is high risk for thrombosis given that she has had a previous stroke as well as age over 24. I have discussed with her that ideally she should be on an anticoagulant, however given her weakness I am concerned that she is a fall risk. Therefore we will continue an ASA 81 mg PO daily at this time. Patient is in agreement.  I recommended that she can take a multivitamin if she wants. I advised her that she can take Aleve or Advil for her headaches prn.     RTC in 6 weeks for follow up with labs.   Orders Placed This Encounter  Procedures  . CBC with Differential    Standing Status:   Future    Standing Expiration Date:   07/24/2017  . Comprehensive metabolic panel    Standing Status:   Future    Standing Expiration Date:   09/23/2017      This document serves as a record of services personally performed by Twana First, MD. It was created on her behalf by Shirlean Mylar, a trained medical scribe. The creation of this record is based on the scribe's personal observations and the provider's statements to them. This document has been checked and approved by the attending provider.  I have reviewed the above documentation for accuracy and completeness and I agree with the above.

## 2016-07-28 ENCOUNTER — Other Ambulatory Visit: Payer: Self-pay | Admitting: Family Medicine

## 2016-08-08 ENCOUNTER — Other Ambulatory Visit: Payer: Self-pay | Admitting: Family Medicine

## 2016-09-02 ENCOUNTER — Other Ambulatory Visit: Payer: Self-pay | Admitting: Family Medicine

## 2016-09-03 ENCOUNTER — Encounter: Payer: Self-pay | Admitting: Gastroenterology

## 2016-09-03 NOTE — Telephone Encounter (Signed)
Last seen 11/26/15  Dr Sabra Heck

## 2016-09-03 NOTE — Telephone Encounter (Signed)
Go ahead and do a refill until she can get them for an appointment but she needs to get in now for appointment.

## 2016-09-05 ENCOUNTER — Encounter (HOSPITAL_BASED_OUTPATIENT_CLINIC_OR_DEPARTMENT_OTHER): Payer: Medicare Other | Admitting: Oncology

## 2016-09-05 ENCOUNTER — Encounter (HOSPITAL_COMMUNITY): Payer: Medicare Other | Attending: Oncology

## 2016-09-05 VITALS — BP 120/64 | HR 65 | Temp 98.5°F | Resp 20 | Wt 169.7 lb

## 2016-09-05 DIAGNOSIS — D473 Essential (hemorrhagic) thrombocythemia: Secondary | ICD-10-CM | POA: Diagnosis not present

## 2016-09-05 DIAGNOSIS — D75839 Thrombocytosis, unspecified: Secondary | ICD-10-CM

## 2016-09-05 LAB — COMPREHENSIVE METABOLIC PANEL
ALT: 10 U/L — ABNORMAL LOW (ref 14–54)
ANION GAP: 9 (ref 5–15)
AST: 18 U/L (ref 15–41)
Albumin: 4.4 g/dL (ref 3.5–5.0)
Alkaline Phosphatase: 61 U/L (ref 38–126)
BILIRUBIN TOTAL: 0.9 mg/dL (ref 0.3–1.2)
BUN: 15 mg/dL (ref 6–20)
CO2: 27 mmol/L (ref 22–32)
Calcium: 9.5 mg/dL (ref 8.9–10.3)
Chloride: 103 mmol/L (ref 101–111)
Creatinine, Ser: 0.86 mg/dL (ref 0.44–1.00)
Glucose, Bld: 110 mg/dL — ABNORMAL HIGH (ref 65–99)
POTASSIUM: 5.1 mmol/L (ref 3.5–5.1)
Sodium: 139 mmol/L (ref 135–145)
TOTAL PROTEIN: 7.3 g/dL (ref 6.5–8.1)

## 2016-09-05 LAB — CBC WITH DIFFERENTIAL/PLATELET
Basophils Absolute: 0.1 10*3/uL (ref 0.0–0.1)
Basophils Relative: 1 %
Eosinophils Absolute: 0.4 10*3/uL (ref 0.0–0.7)
Eosinophils Relative: 5 %
HEMATOCRIT: 40.6 % (ref 36.0–46.0)
Hemoglobin: 13.4 g/dL (ref 12.0–15.0)
LYMPHS PCT: 29 %
Lymphs Abs: 2.1 10*3/uL (ref 0.7–4.0)
MCH: 35.7 pg — ABNORMAL HIGH (ref 26.0–34.0)
MCHC: 33 g/dL (ref 30.0–36.0)
MCV: 108.3 fL — ABNORMAL HIGH (ref 78.0–100.0)
MONO ABS: 0.7 10*3/uL (ref 0.1–1.0)
MONOS PCT: 10 %
NEUTROS ABS: 4 10*3/uL (ref 1.7–7.7)
Neutrophils Relative %: 55 %
Platelets: 379 10*3/uL (ref 150–400)
RBC: 3.75 MIL/uL — ABNORMAL LOW (ref 3.87–5.11)
RDW: 15.1 % (ref 11.5–15.5)
WBC: 7.3 10*3/uL (ref 4.0–10.5)

## 2016-09-05 NOTE — Progress Notes (Signed)
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 I personally reviewed and went over labs with the patient.  She continues to be compliant with hydroxyurea and takes it as scheduled. Overall she feels well and has no complaints. She is accompanied by her husband today. She denies having any new blood clots.  Lab results from today show platelet counts at 379k, down from 495 K on her last visit 3 months ago. Patient was asking with dietary changes she can make to affect her thrombocytosis and I have told her that there is nothing that can improve it dietary wise. However patient has been states she drinks a lot of sweet tea, therefore I have recommended for her to decrease her sugar intake especially given that she has diabetes.   REVIEW OF SYSTEMS:   Review of Systems  Constitutional: Negative for chills, fever and malaise/fatigue.  HENT: Negative.   Eyes: Negative.   Respiratory: Negative.   Cardiovascular: Negative.   Gastrointestinal: Negative.   Genitourinary: Negative.   Musculoskeletal: Negative for falls and neck pain.  Skin: Negative.   Endo/Heme/Allergies: Negative.   Psychiatric/Behavioral: Negative.   All other systems reviewed and are negative.  14 Point review of systems of done and is negative except as noted above.  . Past Medical History:  Diagnosis Date  . Anxiety   . Arthritis   . Asthma   . Cancer [facility])    Skin cancer  . Cataract   . Cataracts, both eyes 05/2014  . COPD  (chronic obstructive pulmonary disease) ([place])   . Depression   . Diabetes mellitus without complication ([place])   . GERD (gastroesophageal reflux disease)   . Hyperlipidemia   . Hypertension   . Seizures ([place])    From RH Negative bvlood when pregnant  . Stroke ([place])   . Thyroid disease     . Past Surgical History:  Procedure Laterality Date  . ABDOMINAL HYSTERECTOMY    . ANKLE SURGERY Right   . BREAST SURGERY     Bx, none malignant  . COLONOSCOPY  2013   normal per patient  . COLONOSCOPY N/A 05/14/2016   Procedure: COLONOSCOPY;  Surgeon: Danie Binder, MD;  [facility];  Service: Endoscopy;  Laterality: N/A;  8:30AM  . ESOPHAGOGASTRODUODENOSCOPY N/A 01/14/2016   Dr. Oneida Alar: normal esophagus, single hyperplastic gastric polyp, normal duodenum, negative H.pylori  . ROTATOR CUFF REPAIR Right   . Skin Cancers    . TONSILLECTOMY    . TONSILLECTOMY      . Social History  Substance Use Topics  . Smoking status: Former Smoker    Packs/day: 3.00    Years: 36.00    Types: Cigarettes    Quit date: 03/10/2000  . Smokeless tobacco: Never Used  . Alcohol use No    ALLERGIES:  is allergic to bee venom; codeine; lipitor [atorvastatin]; penicillins; demeclocycline; morphine and related; and tetracyclines & related.  MEDICATIONS:  Current Outpatient Prescriptions  Medication Sig Dispense Refill  . aspirin EC 81 MG tablet Take 81 mg by mouth daily.    Jordan [name]  fexofenadine-pseudoephedrine (ALLEGRA-D 24) 180-240 MG 24 hr tablet Take 1 tablet by mouth daily as needed (allergies).    . gabapentin (NEURONTIN) 300 MG capsule TAKE 1 CAPSULE (300 MG TOTAL) BY MOUTH AT BEDTIME. 30 capsule 5  . hydroxyurea (HYDREA) 500 MG capsule TAKE 2 CAPSULES ON MON-TUES-WED-FRI-SAT. TAKE WITH FOOD. 40 capsule 2  . levothyroxine (SYNTHROID, LEVOTHROID) 112 MCG tablet TAKE 1 TABLET (112 MCG TOTAL) BY MOUTH DAILY. 90 tablet 0  . metFORMIN (GLUCOPHAGE) 500 MG tablet Take 1 tablet (500 mg total) by mouth 2  (two) times daily with a meal. 60 tablet 0  . metoprolol succinate (TOPROL-XL) 25 MG 24 hr tablet TAKE 1 TABLET (25 MG TOTAL) BY MOUTH DAILY. 90 tablet 1  . Multiple Vitamins-Minerals (ICAPS AREDS 2 PO) Take 1 capsule by mouth 2 (two) times daily.    Jordan [name] omeprazole (PRILOSEC) 20 MG capsule 1 po every morning 30 minutes prior to your first meal. (Patient taking differently: Take 20 mg by mouth daily. every morning 30 minutes prior to your first meal.) 31 capsule 11  . PARoxetine (PAXIL) 20 MG tablet TAKE 1 TABLET (20 MG TOTAL) BY MOUTH DAILY. 30 tablet 5  . VENTOLIN HFA 108 (90 Base) MCG/ACT inhaler INHALE 2 PUFFS INTO THE LUNGS EVERY 4 (FOUR) HOURS AS NEEDED FOR WHEEZING OR SHORTNESS OF BREATH. 18 Inhaler 0  . pramoxine-hydrocortisone (PROCTOCREAM-HC) 1-1 % rectal cream USE PR 4 TIMES A DAY FOR 10 DAYS. (Patient not taking: Reported on 09/05/2016) 30 g 0  . promethazine (PHENERGAN) 12.5 MG tablet Take 1 tablet (12.5 mg total) by mouth every 8 (eight) hours as needed for nausea or vomiting. (Patient not taking: Reported on 09/05/2016) 30 tablet 0   No current facility-administered medications for this visit.     PHYSICAL EXAMINATION: ECOG PERFORMANCE STATUS: 2 - Symptomatic, <50% confined to bed  . Vitals:   09/05/16 1442  BP: 120/64  Pulse: 65  Resp: 20  Temp: 98.5 F (36.9 C)   Filed Weights   09/05/16 1442  Weight: 169 lb 11.2 oz (77 kg)      Physical Exam  Constitutional: She is oriented to person, place, and time and well-developed, well-nourished, and in no distress.  HENT:  Head: Normocephalic and atraumatic.  Eyes: Conjunctivae and EOM are normal. Pupils are equal, round, and reactive to light.  Neck: Normal range of motion. Neck supple.  Cardiovascular: Normal rate, regular rhythm and normal heart sounds.   Pulmonary/Chest: Effort normal and breath sounds normal.  Abdominal: Soft. Bowel sounds are normal.  Musculoskeletal: Normal range of motion.  Neurological: She is  alert and oriented to person, place, and time. Gait normal.  Skin: Skin is warm and dry.  Nursing note and vitals reviewed.    LABORATORY DATA:   I have reviewed the data as listed  . CBC Latest Ref Rng & Units 09/05/2016 07/24/2016 05/23/2016  WBC 4.0 - 10.5 K/uL 7.3 9.1 7.7  Hemoglobin 12.0 - 15.0 g/dL 13.4 13.8 14.6  Hematocrit 36.0 - 46.0 % 40.6 42.4 45.1  Platelets 150 - 400 K/uL 379 495(H) 372    . CMP Latest Ref Rng & Units 09/05/2016 07/24/2016 05/23/2016  Glucose 65 - 99 mg/dL 110(H) 116(H) 89  BUN 6 - 20 mg/dL 15 16 18   Creatinine 0.44 - 1.00 mg/dL 0.86 0.88 0.87  Sodium 135 - 145 mmol/L 139 136 137  Potassium 3.5 - 5.1 mmol/L 5.1 5.4(H) 4.4  Chloride 101 - 111 mmol/L 103 100(L) 101  CO2 22 -  32 mmol/L 27 31 30   Calcium 8.9 - 10.3 mg/dL 9.5 9.2 9.2  Total Protein 6.5 - 8.1 g/dL 7.3 7.1 6.8  Total Bilirubin 0.3 - 1.2 mg/dL 0.9 1.0 0.7  Alkaline Phos 38 - 126 U/L 61 66 53  AST 15 - 41 U/L 18 15 15   ALT 14 - 54 U/L 10(L) 9(L) 8(L)     RADIOGRAPHIC STUDIES: I have personally reviewed the radiological images as listed and agreed with the findings in the report. No results found.  ASSESSMENT & PLAN:  Jak2 V617F Essential Thrombocytosis.  PLAN: Labs reviewed. Results noted above.   Patient platelet count was 379K today. I discussed with her that the goal is to keep her platelet count below 450. Continue hydrea on the current schedule.  Patient is high risk for thrombosis given that she has had a previous stroke as well as age over 46. I have discussed with her that ideally she should be on an anticoagulant, however given her weakness I am concerned that she is a fall risk. Therefore we will continue an ASA 81 mg PO daily at this time. Patient is in agreement.  RTC in 4 months for follow up with labs.   Orders Placed This Encounter  Procedures  . CBC with Differential    Standing Status:   Future    Standing Expiration Date:   09/05/2017  . Comprehensive metabolic  panel    Standing Status:   Future    Standing Expiration Date:   09/05/2017   Twana First, MD

## 2016-09-05 NOTE — Patient Instructions (Signed)
[facility] Discharge Instructions  RECOMMENDATIONS MADE BY THE CONSULTANT AND ANY TEST RESULTS WILL BE SENT TO YOUR REFERRING PHYSICIAN.    Thank you for choosing [facility] to provide your oncology and hematology care.  To afford each patient quality time with our provider, please arrive at least 15 minutes before your scheduled appointment time.    If you have a lab appointment with the Cancer Center please come in thru the  Main Entrance and check in at the main information desk  You need to re-schedule your appointment should you arrive 10 or more minutes late.  We strive to give you quality time with our providers, and arriving late affects you and other patients whose appointments are after yours.  Also, if you no show three or more times for appointments you may be dismissed from the clinic at the providers discretion.     Again, thank you for choosing [facility].  Our hope is that these requests will decrease the amount of time that you wait before being seen by our physicians.       _____________________________________________________________  Should you have questions after your visit to [facility], please contact our office at [phone] between the hours of 8:30 a.m. and 4:30 p.m.  Voicemails left after 4:30 p.m. will not be returned until the following business day.  For prescription refill requests, have your pharmacy contact our office.       Resources For Cancer Patients and their Caregivers ? American Cancer Society: Can assist with transportation, wigs, general needs, runs Look Good Feel Better.        1-888-227-6333 ? Cancer Care: Provides financial assistance, online support groups, medication/co-pay assistance.  1-800-813-HOPE (4673) ? [facility] Assists [place] cancer patients and their families through emotional , educational  and financial support.  336-427-4357 ? [place] DSS Where to apply for food stamps, Medicaid and utility assistance. 336-342-1394 ? [facility]: Transportation to medical appointments. 336-347-2287 ? Social Security Administration: May apply for disability if have a Stage IV cancer. 336-342-7796 1-800-772-1213 ? [place] Aging, Disability and Transit Services: Assists with nutrition, care and transit needs. 336-349-2343  Cancer Center Support Programs: [email or web address]@ > Cancer Support Group  2nd Tuesday of the month 1pm-2pm, Journey Room  > Creative Journey  3rd Tuesday of the month 1130am-1pm, Journey Room  > Look Good Feel Better  1st Wednesday of the month 10am-12 noon, Journey Room (Call American Cancer Society to register 1-800-395-5775)   

## 2016-09-11 ENCOUNTER — Encounter: Payer: Self-pay | Admitting: Nurse Practitioner

## 2016-09-11 ENCOUNTER — Ambulatory Visit (INDEPENDENT_AMBULATORY_CARE_PROVIDER_SITE_OTHER): Payer: Medicare Other | Admitting: Nurse Practitioner

## 2016-09-11 VITALS — BP 124/65 | HR 74 | Temp 98.3°F | Ht 66.0 in | Wt 169.0 lb

## 2016-09-11 DIAGNOSIS — R300 Dysuria: Secondary | ICD-10-CM | POA: Diagnosis not present

## 2016-09-11 DIAGNOSIS — N3000 Acute cystitis without hematuria: Secondary | ICD-10-CM | POA: Diagnosis not present

## 2016-09-11 LAB — URINALYSIS
Bilirubin, UA: NEGATIVE
GLUCOSE, UA: NEGATIVE
KETONES UA: NEGATIVE
Nitrite, UA: NEGATIVE
PROTEIN UA: NEGATIVE
RBC, UA: NEGATIVE
Specific Gravity, UA: 1.025 (ref 1.005–1.030)
UUROB: 0.2 mg/dL (ref 0.2–1.0)
pH, UA: 5 (ref 5.0–7.5)

## 2016-09-11 MED ORDER — CIPROFLOXACIN HCL 500 MG PO TABS: 500.0000 mg | ORAL_TABLET | Freq: Two times a day (BID) | ORAL | 0 refills | Status: DC

## 2016-09-11 NOTE — Patient Instructions (Signed)
Take medication as prescribe Cotton underwear Take shower not bath Cranberry juice, yogurt Force fluids AZO over the counter X2 days Culture pending RTO prn  

## 2016-09-11 NOTE — Progress Notes (Signed)
   Subjective:    Patient ID: Jordan [name], female    DOB: 22-Jan-1941, 75 y.o.   MRN: [id number]  HPI Patient comes in today c/o dysuria and frequency. Started on Monday and seems to have gotten worse. ALso started having lower back pain and suprapubic pain.    Review of Systems  Constitutional: Negative.   HENT: Negative.   Respiratory: Negative.   Cardiovascular: Negative.   Genitourinary: Positive for dysuria, frequency, pelvic pain and urgency.  Musculoskeletal: Positive for back pain.  Neurological: Negative.   Psychiatric/Behavioral: Negative.   All other systems reviewed and are negative.      Objective:   Physical Exam  Constitutional: She is oriented to person, place, and time. She appears well-developed and well-nourished. No distress.  Cardiovascular: Normal rate and regular rhythm.   Pulmonary/Chest: Effort normal and breath sounds normal.  Abdominal: Soft. Bowel sounds are normal. There is tenderness (slight suprapubic pain on palpation).  Neurological: She is alert and oriented to person, place, and time.  Skin: Skin is warm.  Psychiatric: She has a normal mood and affect. Her behavior is normal. Judgment and thought content normal.   BP 124/65   Pulse 74   Temp 98.3 F (36.8 C) (Oral)   Ht 5\' 6"  (1.676 m)   Wt 169 lb (76.7 kg)   BMI 27.28 kg/m   UA_ leuks (+)     Assessment & Plan:  1. Dysuria  - Urinalysis  2. Acute cystitis without hematuria Force fluids RTO prn - ciprofloxacin (CIPRO) 500 MG tablet; Take 1 tablet (500 mg total) by mouth 2 (two) times daily.  Dispense: 6 tablet; Refill: 0 - Urine Culture  Mary-Margaret Hassell Done, FNP

## 2016-09-12 ENCOUNTER — Telehealth (HOSPITAL_COMMUNITY): Payer: Self-pay

## 2016-09-12 DIAGNOSIS — D471 Chronic myeloproliferative disease: Secondary | ICD-10-CM

## 2016-09-12 MED ORDER — HYDROXYUREA 500 MG PO CAPS: ORAL_CAPSULE | ORAL | 2 refills | Status: DC

## 2016-09-12 NOTE — Telephone Encounter (Signed)
Patients pharmacy called stating that patient says she takes Hydrea 2 pills on Monday-Tuesday-Wednesday-Friday- Saturday and 1 pill on Thursday and Sunday.  Last note states she just takes 2 pills on Monday-Tuesday-Wednesday-   Friday- Saturday. Reviewed with Dr. Talbert Cage. She said to order Hydrea like the patient states she is taking it. New prescription sent to [facility] in [place].

## 2016-09-13 LAB — URINE CULTURE

## 2016-10-01 ENCOUNTER — Other Ambulatory Visit: Payer: Self-pay | Admitting: Family Medicine

## 2016-10-09 ENCOUNTER — Other Ambulatory Visit: Payer: Self-pay | Admitting: Family Medicine

## 2016-11-12 ENCOUNTER — Other Ambulatory Visit: Payer: Self-pay | Admitting: Family Medicine

## 2016-11-13 NOTE — Telephone Encounter (Signed)
Last refill without being seen 

## 2016-12-29 ENCOUNTER — Other Ambulatory Visit: Payer: Self-pay | Admitting: Family

## 2017-01-07 ENCOUNTER — Other Ambulatory Visit (HOSPITAL_COMMUNITY): Payer: Self-pay | Admitting: *Deleted

## 2017-01-09 ENCOUNTER — Encounter (HOSPITAL_BASED_OUTPATIENT_CLINIC_OR_DEPARTMENT_OTHER): Payer: Medicare Other | Admitting: Oncology

## 2017-01-09 ENCOUNTER — Encounter (HOSPITAL_COMMUNITY): Payer: Medicare Other | Attending: Oncology

## 2017-01-09 ENCOUNTER — Encounter (HOSPITAL_COMMUNITY): Payer: Self-pay | Admitting: Oncology

## 2017-01-09 ENCOUNTER — Other Ambulatory Visit (HOSPITAL_COMMUNITY): Payer: Self-pay | Admitting: Oncology

## 2017-01-09 VITALS — BP 128/57 | HR 76 | Temp 98.2°F | Resp 20 | Ht 66.0 in | Wt 172.0 lb

## 2017-01-09 DIAGNOSIS — Z23 Encounter for immunization: Secondary | ICD-10-CM | POA: Diagnosis not present

## 2017-01-09 DIAGNOSIS — D471 Chronic myeloproliferative disease: Secondary | ICD-10-CM

## 2017-01-09 DIAGNOSIS — R197 Diarrhea, unspecified: Secondary | ICD-10-CM | POA: Diagnosis not present

## 2017-01-09 DIAGNOSIS — E875 Hyperkalemia: Secondary | ICD-10-CM

## 2017-01-09 DIAGNOSIS — R200 Anesthesia of skin: Secondary | ICD-10-CM | POA: Diagnosis not present

## 2017-01-09 DIAGNOSIS — D473 Essential (hemorrhagic) thrombocythemia: Secondary | ICD-10-CM | POA: Diagnosis not present

## 2017-01-09 DIAGNOSIS — D75839 Thrombocytosis, unspecified: Secondary | ICD-10-CM

## 2017-01-09 LAB — COMPREHENSIVE METABOLIC PANEL
ALT: 8 U/L — AB (ref 14–54)
AST: 19 U/L (ref 15–41)
Albumin: 4.4 g/dL (ref 3.5–5.0)
Alkaline Phosphatase: 81 U/L (ref 38–126)
Anion gap: 9 (ref 5–15)
BUN: 11 mg/dL (ref 6–20)
CHLORIDE: 100 mmol/L — AB (ref 101–111)
CO2: 30 mmol/L (ref 22–32)
CREATININE: 0.89 mg/dL (ref 0.44–1.00)
Calcium: 9.5 mg/dL (ref 8.9–10.3)
GFR calc non Af Amer: >60 mL/min (ref >60)
Glucose, Bld: 120 mg/dL — ABNORMAL HIGH (ref 65–99)
Potassium: 5.5 mmol/L — ABNORMAL HIGH (ref 3.5–5.1)
SODIUM: 139 mmol/L (ref 135–145)
Total Bilirubin: 0.6 mg/dL (ref 0.3–1.2)
Total Protein: 7.4 g/dL (ref 6.5–8.1)

## 2017-01-09 LAB — CBC WITH DIFFERENTIAL/PLATELET
BASOS ABS: 0.1 10*3/uL (ref 0.0–0.1)
Basophils Relative: 1 %
EOS ABS: 0.4 10*3/uL (ref 0.0–0.7)
EOS PCT: 5 %
HCT: 40.8 % (ref 36.0–46.0)
Hemoglobin: 13.3 g/dL (ref 12.0–15.0)
Lymphocytes Relative: 29 %
Lymphs Abs: 1.9 10*3/uL (ref 0.7–4.0)
MCH: 36.4 pg — ABNORMAL HIGH (ref 26.0–34.0)
MCHC: 32.6 g/dL (ref 30.0–36.0)
MCV: 111.8 fL — ABNORMAL HIGH (ref 78.0–100.0)
Monocytes Absolute: 0.8 10*3/uL (ref 0.1–1.0)
Monocytes Relative: 13 %
Neutro Abs: 3.4 10*3/uL (ref 1.7–7.7)
Neutrophils Relative %: 52 %
PLATELETS: 419 10*3/uL — AB (ref 150–400)
RBC: 3.65 MIL/uL — ABNORMAL LOW (ref 3.87–5.11)
RDW: 14 % (ref 11.5–15.5)
WBC: 6.5 10*3/uL (ref 4.0–10.5)

## 2017-01-09 MED ORDER — SODIUM POLYSTYRENE SULFONATE PO POWD: Freq: Once | ORAL | 0 refills | Status: DC

## 2017-01-09 MED ORDER — HYDROXYUREA 500 MG PO CAPS: ORAL_CAPSULE | ORAL | 4 refills | Status: DC

## 2017-01-09 MED ORDER — INFLUENZA VAC SPLIT HIGH-DOSE 0.5 ML IM SUSY
0.5000 mL | PREFILLED_SYRINGE | Freq: Once | INTRAMUSCULAR | Status: AC
Start: 2017-01-09 — End: 2017-01-09
  Administered 2017-01-09: 0.5 mL via INTRAMUSCULAR
  Filled 2017-01-09: qty 0.5

## 2017-01-09 MED ORDER — INFLUENZA VAC SPLIT QUAD 0.5 ML IM SUSY
0.5000 mL | PREFILLED_SYRINGE | Freq: Once | INTRAMUSCULAR | Status: DC
  Filled 2017-01-09: qty 0.5

## 2017-01-09 MED ORDER — SODIUM POLYSTYRENE SULFONATE PO POWD: Freq: Once | ORAL | 0 refills | Status: AC

## 2017-01-09 NOTE — Progress Notes (Signed)
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 I personally reviewed and went over labs with the patient.  Patient states that she has been out of her Hydrea for the past 3 days.  Prior to that she has been taking it as directed and tolerating it well.  She states she has been having tingling in her fingertips and also has numbness in both of her toes and feet.  She also states that she has been having intermittent substernal chest discomfort, which is nonradiating and not related to food.  She has not seen any physician for her chest discomfort.  She denies any shortness of breath or abdominal pain.  She has intermittent diarrhea.  She denies any nausea vomiting.  Weight has been stable and appetite is good.   REVIEW OF SYSTEMS:   Review of Systems  Constitutional: Negative for chills, fever and malaise/fatigue.  HENT: Negative.   Eyes: Negative.   Respiratory: Negative.   Cardiovascular: Positive for chest pain.  Gastrointestinal: Positive for diarrhea.  Genitourinary: Negative.   Musculoskeletal: Negative for falls and neck pain.  Skin: Negative.   Endo/Heme/Allergies: Negative.   Psychiatric/Behavioral: Negative.   All other systems reviewed and are negative.  14 Point review of systems of done and is negative except as noted above.  . Past Medical History:  Diagnosis Date  . Anxiety   . Arthritis   . Asthma   . Cancer [facility])    Skin cancer  .  Cataract   . Cataracts, both eyes 05/2014  . COPD (chronic obstructive pulmonary disease) ([place])   . Depression   . Diabetes mellitus without complication ([place])   . GERD (gastroesophageal reflux disease)   . Hyperlipidemia   . Hypertension   . Seizures ([place])    From RH Negative bvlood when pregnant  . Stroke ([place])   . Thyroid disease     . Past Surgical History:  Procedure Laterality Date  . ABDOMINAL HYSTERECTOMY    . ANKLE SURGERY Right   . BREAST SURGERY     Bx, none malignant  . COLONOSCOPY  2013   normal per patient  . COLONOSCOPY N/A 05/14/2016   Procedure: COLONOSCOPY;  Surgeon: Danie Binder, MD;  [facility];  Service: Endoscopy;  Laterality: N/A;  8:30AM  . ESOPHAGOGASTRODUODENOSCOPY N/A 01/14/2016   Dr. Oneida Alar: normal esophagus, single hyperplastic gastric polyp, normal duodenum, negative H.pylori  . ROTATOR CUFF REPAIR Right   . Skin Cancers    . TONSILLECTOMY    . TONSILLECTOMY      . Social History  Substance Use Topics  . Smoking status: Former Smoker    Packs/day: 3.00    Years: 36.00    Types: Cigarettes    Quit date: 03/10/2000  . Smokeless tobacco: Never Used  . Alcohol use No    ALLERGIES:  is allergic to bee venom; codeine; lipitor [atorvastatin]; penicillins; demeclocycline; morphine and related; and tetracyclines & related.  MEDICATIONS:  Current Outpatient Prescriptions  Medication Sig Dispense Refill  .  aspirin EC 81 MG tablet Take 81 mg by mouth daily.    . ciprofloxacin (CIPRO) 500 MG tablet Take 1 tablet (500 mg total) by mouth 2 (two) times daily. 6 tablet 0  . fexofenadine-pseudoephedrine (ALLEGRA-D 24) 180-240 MG 24 hr tablet Take 1 tablet by mouth daily as needed (allergies).    . gabapentin (NEURONTIN) 300 MG capsule TAKE 1 CAPSULE (300 MG TOTAL) BY MOUTH AT BEDTIME. 30 capsule 5  . hydroxyurea (HYDREA) 500 MG capsule TAKE 2 CAPSULES ON MON-TUES-WED-FRI-SAT. Take 1 po on thurs and sunday 48 capsule 2  . levothyroxine  (SYNTHROID, LEVOTHROID) 112 MCG tablet TAKE 1 TABLET (112 MCG TOTAL) BY MOUTH DAILY. 90 tablet 0  . metFORMIN (GLUCOPHAGE) 500 MG tablet Take 1 tablet (500 mg total) by mouth 2 (two) times daily with a meal. 60 tablet 0  . metFORMIN (GLUCOPHAGE) 500 MG tablet TAKE 1 TABLET (500 MG TOTAL) BY MOUTH 2 (TWO) TIMES DAILY WITH A MEAL. 60 tablet 0  . metoprolol succinate (TOPROL-XL) 25 MG 24 hr tablet TAKE 1 TABLET (25 MG TOTAL) BY MOUTH DAILY. 90 tablet 1  . Multiple Vitamins-Minerals (ICAPS AREDS 2 PO) Take 1 capsule by mouth 2 (two) times daily.    Marland Kitchen omeprazole (PRILOSEC) 20 MG capsule 1 po every morning 30 minutes prior to your first meal. (Patient taking differently: Take 20 mg by mouth daily. every morning 30 minutes prior to your first meal.) 31 capsule 11  . PARoxetine (PAXIL) 20 MG tablet TAKE 1 TABLET (20 MG TOTAL) BY MOUTH DAILY. 30 tablet 5  . pramoxine-hydrocortisone (PROCTOCREAM-HC) 1-1 % rectal cream USE PR 4 TIMES A DAY FOR 10 DAYS. 30 g 0  . promethazine (PHENERGAN) 12.5 MG tablet Take 1 tablet (12.5 mg total) by mouth every 8 (eight) hours as needed for nausea or vomiting. 30 tablet 0  . VENTOLIN HFA 108 (90 Base) MCG/ACT inhaler INHALE 2 PUFFS INTO THE LUNGS EVERY 4 (FOUR) HOURS AS NEEDED FOR WHEEZING OR SHORTNESS OF BREATH. 18 Inhaler 0   Current Facility-Administered Medications  Medication Dose Route Frequency Provider Last Rate Last Dose  . Influenza vac split quadrivalent PF (FLUARIX) injection 0.5 mL  0.5 mL Intramuscular Once Twana First, MD        PHYSICAL EXAMINATION: ECOG PERFORMANCE STATUS: 2 - Symptomatic, <50% confined to bed  . Vitals:   01/09/17 1523  BP: (!) 128/57  Pulse: 76  Resp: 20  Temp: 98.2 F (36.8 C)  SpO2: 97%   Filed Weights   01/09/17 1523  Weight: 172 lb (78 kg)      Physical Exam  Constitutional: She is oriented to person, place, and time and well-developed, well-nourished, and in no distress.  HENT:  Head: Normocephalic and  atraumatic.  Eyes: Pupils are equal, round, and reactive to light. Conjunctivae and EOM are normal.  Neck: Normal range of motion. Neck supple.  Cardiovascular: Normal rate, regular rhythm and normal heart sounds.   Pulmonary/Chest: Effort normal and breath sounds normal.  Abdominal: Soft. Bowel sounds are normal.  Musculoskeletal: Normal range of motion.  Neurological: She is alert and oriented to person, place, and time. Gait normal.  Skin: Skin is warm and dry.  Nursing note and vitals reviewed.    LABORATORY DATA:   I have reviewed the data as listed  . CBC Latest Ref Rng & Units 01/09/2017 09/05/2016 07/24/2016  WBC 4.0 - 10.5 K/uL 6.5 7.3 9.1  Hemoglobin 12.0 - 15.0 g/dL 13.3 13.4 13.8  Hematocrit 36.0 -  46.0 % 40.8 40.6 42.4  Platelets 150 - 400 K/uL 419(H) 379 495(H)    . CMP Latest Ref Rng & Units 01/09/2017 09/05/2016 07/24/2016  Glucose 65 - 99 mg/dL 120(H) 110(H) 116(H)  BUN 6 - 20 mg/dL 11 15 16   Creatinine 0.44 - 1.00 mg/dL 0.89 0.86 0.88  Sodium 135 - 145 mmol/L 139 139 136  Potassium 3.5 - 5.1 mmol/L 5.5(H) 5.1 5.4(H)  Chloride 101 - 111 mmol/L 100(L) 103 100(L)  CO2 22 - 32 mmol/L 30 27 31   Calcium 8.9 - 10.3 mg/dL 9.5 9.5 9.2  Total Protein 6.5 - 8.1 g/dL 7.4 7.3 7.1  Total Bilirubin 0.3 - 1.2 mg/dL 0.6 0.9 1.0  Alkaline Phos 38 - 126 U/L 81 61 66  AST 15 - 41 U/L 19 18 15   ALT 14 - 54 U/L 8(L) 10(L) 9(L)     RADIOGRAPHIC STUDIES: I have personally reviewed the radiological images as listed and agreed with the findings in the report. No results found.  ASSESSMENT & PLAN:  1. 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  PLAN: Labs reviewed. Results noted above.   Patient platelet count was 419k today, may be mildly increased due to not having her hydrea for 3 days. I discussed with her that the goal is to keep her platelet count below 450. Continue hydrea on the current schedule. Refill sent into pharmacy today.  Patient is high risk for thrombosis given  that she has had a previous stroke as well as age over 83. I have discussed with her that ideally she should be on an anticoagulant, however given her weakness I am concerned that she is a fall risk. Therefore we will continue an ASA 81 mg PO daily at this time. Patient is in agreement.  2. Persistent hyperkalemia  I have told the patient to discuss her persistent hyperkalemia with her PCP. I will give her a dose of kaexylate today.  3. Chest pain/discomfort  Recommended for patient to go to ER if her chest pain occurs again. Also urged her to see her PCP for further workup.  4. New tingling in her fingertips  Suspect this is likely due to diabetic neuropathy. She is already on gabapentin. Discuss with PCP.  5. Health Maintenance  Annual Flu Vaccine given today.  RTC in 4 months for follow up with labs.   Orders Placed This Encounter  Procedures  . CBC with Differential    Standing Status:   Future    Standing Expiration Date:   01/09/2018  . Comprehensive metabolic panel    Standing Status:   Future    Standing Expiration Date:   01/09/2018   Twana First, MD

## 2017-01-31 ENCOUNTER — Other Ambulatory Visit: Payer: Self-pay | Admitting: Physician Assistant

## 2017-02-02 ENCOUNTER — Other Ambulatory Visit: Payer: Self-pay | Admitting: Physician Assistant

## 2017-02-02 NOTE — Telephone Encounter (Signed)
Last seen 09/11/16  Dr Sabra Heck PCP

## 2017-02-02 NOTE — Telephone Encounter (Signed)
Patient has not been seen for either of these meds in >1 year.  Please have her schedule an appt for follow up.  Meds sent to pharmacy for 2 months.  Will need appt for further fills.

## 2017-02-03 ENCOUNTER — Other Ambulatory Visit: Payer: Self-pay | Admitting: Gastroenterology

## 2017-02-04 ENCOUNTER — Other Ambulatory Visit: Payer: Self-pay | Admitting: Nurse Practitioner

## 2017-02-05 NOTE — Telephone Encounter (Signed)
Detailed message left for patient that she needs to be seen.  

## 2017-02-05 NOTE — Telephone Encounter (Signed)
Last refill without being seen 

## 2017-02-05 NOTE — Telephone Encounter (Signed)
Dr Sabra Heck PCP Saw you 2/18  Last thyroid 2/17

## 2017-02-06 ENCOUNTER — Other Ambulatory Visit: Payer: Self-pay | Admitting: Family Medicine

## 2017-02-06 ENCOUNTER — Other Ambulatory Visit: Payer: Self-pay | Admitting: Nurse Practitioner

## 2017-02-06 NOTE — Telephone Encounter (Signed)
Dr Sabra Heck PCP   Last seen 09/11/16  MMM

## 2017-02-06 NOTE — Telephone Encounter (Signed)
Dr Sabra Heck  PCP   Last thyroid 04/17/15

## 2017-02-12 ENCOUNTER — Other Ambulatory Visit: Payer: Self-pay

## 2017-02-12 ENCOUNTER — Ambulatory Visit (INDEPENDENT_AMBULATORY_CARE_PROVIDER_SITE_OTHER): Payer: Medicare Other | Admitting: Physician Assistant

## 2017-02-12 ENCOUNTER — Encounter: Payer: Self-pay | Admitting: Physician Assistant

## 2017-02-12 ENCOUNTER — Encounter (HOSPITAL_COMMUNITY): Payer: Self-pay | Admitting: Emergency Medicine

## 2017-02-12 ENCOUNTER — Emergency Department (HOSPITAL_COMMUNITY): Payer: Medicare Other

## 2017-02-12 ENCOUNTER — Inpatient Hospital Stay (HOSPITAL_COMMUNITY)
Admission: EM | Admit: 2017-02-12 | Discharge: 2017-02-14 | DRG: 190 | Disposition: A | Discharge status: Home / Self Care | Payer: Medicare Other | Attending: Internal Medicine | Admitting: Internal Medicine

## 2017-02-12 VITALS — BP 126/67 | HR 74 | Temp 97.0°F | Ht 66.0 in | Wt 168.0 lb

## 2017-02-12 DIAGNOSIS — Z79899 Other long term (current) drug therapy: Secondary | ICD-10-CM

## 2017-02-12 DIAGNOSIS — R0902 Hypoxemia: Secondary | ICD-10-CM

## 2017-02-12 DIAGNOSIS — Z87891 Personal history of nicotine dependence: Secondary | ICD-10-CM

## 2017-02-12 DIAGNOSIS — Z7984 Long term (current) use of oral hypoglycemic drugs: Secondary | ICD-10-CM

## 2017-02-12 DIAGNOSIS — Z91030 Bee allergy status: Secondary | ICD-10-CM

## 2017-02-12 DIAGNOSIS — Z79890 Hormone replacement therapy (postmenopausal): Secondary | ICD-10-CM

## 2017-02-12 DIAGNOSIS — Z885 Allergy status to narcotic agent status: Secondary | ICD-10-CM

## 2017-02-12 DIAGNOSIS — E039 Hypothyroidism, unspecified: Secondary | ICD-10-CM | POA: Diagnosis present

## 2017-02-12 DIAGNOSIS — D75839 Thrombocytosis, unspecified: Secondary | ICD-10-CM | POA: Diagnosis present

## 2017-02-12 DIAGNOSIS — Z91048 Other nonmedicinal substance allergy status: Secondary | ICD-10-CM | POA: Diagnosis not present

## 2017-02-12 DIAGNOSIS — J9621 Acute and chronic respiratory failure with hypoxia: Secondary | ICD-10-CM | POA: Diagnosis not present

## 2017-02-12 DIAGNOSIS — Z888 Allergy status to other drugs, medicaments and biological substances status: Secondary | ICD-10-CM

## 2017-02-12 DIAGNOSIS — Z880 Allergy status to penicillin: Secondary | ICD-10-CM

## 2017-02-12 DIAGNOSIS — Z85828 Personal history of other malignant neoplasm of skin: Secondary | ICD-10-CM | POA: Diagnosis not present

## 2017-02-12 DIAGNOSIS — F419 Anxiety disorder, unspecified: Secondary | ICD-10-CM | POA: Diagnosis present

## 2017-02-12 DIAGNOSIS — R05 Cough: Secondary | ICD-10-CM | POA: Diagnosis not present

## 2017-02-12 DIAGNOSIS — Z9981 Dependence on supplemental oxygen: Secondary | ICD-10-CM | POA: Diagnosis not present

## 2017-02-12 DIAGNOSIS — D473 Essential (hemorrhagic) thrombocythemia: Secondary | ICD-10-CM | POA: Diagnosis present

## 2017-02-12 DIAGNOSIS — J9601 Acute respiratory failure with hypoxia: Secondary | ICD-10-CM

## 2017-02-12 DIAGNOSIS — M1990 Unspecified osteoarthritis, unspecified site: Secondary | ICD-10-CM | POA: Diagnosis present

## 2017-02-12 DIAGNOSIS — J189 Pneumonia, unspecified organism: Secondary | ICD-10-CM | POA: Insufficient documentation

## 2017-02-12 DIAGNOSIS — Z7982 Long term (current) use of aspirin: Secondary | ICD-10-CM | POA: Diagnosis not present

## 2017-02-12 DIAGNOSIS — R0602 Shortness of breath: Secondary | ICD-10-CM | POA: Diagnosis not present

## 2017-02-12 DIAGNOSIS — E785 Hyperlipidemia, unspecified: Secondary | ICD-10-CM | POA: Diagnosis present

## 2017-02-12 DIAGNOSIS — Z66 Do not resuscitate: Secondary | ICD-10-CM | POA: Diagnosis present

## 2017-02-12 DIAGNOSIS — Z8673 Personal history of transient ischemic attack (TIA), and cerebral infarction without residual deficits: Secondary | ICD-10-CM | POA: Diagnosis not present

## 2017-02-12 DIAGNOSIS — E119 Type 2 diabetes mellitus without complications: Secondary | ICD-10-CM

## 2017-02-12 DIAGNOSIS — J440 Chronic obstructive pulmonary disease with acute lower respiratory infection: Secondary | ICD-10-CM | POA: Diagnosis present

## 2017-02-12 DIAGNOSIS — R0603 Acute respiratory distress: Secondary | ICD-10-CM | POA: Diagnosis not present

## 2017-02-12 DIAGNOSIS — J441 Chronic obstructive pulmonary disease with (acute) exacerbation: Principal | ICD-10-CM

## 2017-02-12 DIAGNOSIS — K219 Gastro-esophageal reflux disease without esophagitis: Secondary | ICD-10-CM | POA: Diagnosis present

## 2017-02-12 DIAGNOSIS — J181 Lobar pneumonia, unspecified organism: Secondary | ICD-10-CM

## 2017-02-12 DIAGNOSIS — I10 Essential (primary) hypertension: Secondary | ICD-10-CM | POA: Diagnosis present

## 2017-02-12 DIAGNOSIS — F329 Major depressive disorder, single episode, unspecified: Secondary | ICD-10-CM | POA: Diagnosis present

## 2017-02-12 HISTORY — DX: Essential (hemorrhagic) thrombocythemia: D47.3

## 2017-02-12 HISTORY — DX: Thrombocytosis, unspecified: D75.839

## 2017-02-12 HISTORY — DX: Hyperkalemia: E87.5

## 2017-02-12 LAB — T4, FREE: FREE T4: 1.29 ng/dL — AB (ref 0.61–1.12)

## 2017-02-12 LAB — BASIC METABOLIC PANEL WITH GFR
Anion gap: 10 (ref 5–15)
BUN: 12 mg/dL (ref 6–20)
CO2: 28 mmol/L (ref 22–32)
Calcium: 9.1 mg/dL (ref 8.9–10.3)
Chloride: 100 mmol/L — ABNORMAL LOW (ref 101–111)
Creatinine, Ser: 0.79 mg/dL (ref 0.44–1.00)
GFR calc Af Amer: >60 mL/min
GFR calc non Af Amer: >60 mL/min
Glucose, Bld: 105 mg/dL — ABNORMAL HIGH (ref 65–99)
Potassium: 4 mmol/L (ref 3.5–5.1)
Sodium: 138 mmol/L (ref 135–145)

## 2017-02-12 LAB — CBC WITH DIFFERENTIAL/PLATELET
Basophils Absolute: 0.1 10*3/uL (ref 0.0–0.1)
Basophils Relative: 1 %
Eosinophils Absolute: 0.4 10*3/uL (ref 0.0–0.7)
Eosinophils Relative: 7 %
HCT: 40.1 % (ref 36.0–46.0)
Hemoglobin: 13.5 g/dL (ref 12.0–15.0)
Lymphocytes Relative: 21 %
Lymphs Abs: 1.3 10*3/uL (ref 0.7–4.0)
MCH: 37 pg — ABNORMAL HIGH (ref 26.0–34.0)
MCHC: 33.7 g/dL (ref 30.0–36.0)
MCV: 109.9 fL — ABNORMAL HIGH (ref 78.0–100.0)
Monocytes Absolute: 0.6 10*3/uL (ref 0.1–1.0)
Monocytes Relative: 10 %
Neutro Abs: 3.8 10*3/uL (ref 1.7–7.7)
Neutrophils Relative %: 61 %
Platelets: 372 10*3/uL (ref 150–400)
RBC: 3.65 MIL/uL — ABNORMAL LOW (ref 3.87–5.11)
RDW: 13.4 % (ref 11.5–15.5)
WBC: 6.1 10*3/uL (ref 4.0–10.5)

## 2017-02-12 LAB — GLUCOSE, CAPILLARY: Glucose-Capillary: 233 mg/dL — ABNORMAL HIGH (ref 65–99)

## 2017-02-12 LAB — HEMOGLOBIN A1C
Hgb A1c MFr Bld: 5.9 % — ABNORMAL HIGH (ref 4.8–5.6)
Mean Plasma Glucose: 122.63 mg/dL

## 2017-02-12 LAB — INFLUENZA PANEL BY PCR (TYPE A & B)
Influenza A By PCR: NEGATIVE
Influenza B By PCR: NEGATIVE

## 2017-02-12 LAB — TROPONIN I: Troponin I: <0.03 ng/mL

## 2017-02-12 LAB — TSH: TSH: 1.287 u[IU]/mL (ref 0.350–4.500)

## 2017-02-12 MED ORDER — INSULIN ASPART 100 UNIT/ML ~~LOC~~ SOLN
0.0000 [IU] | Freq: Every day | SUBCUTANEOUS | Status: DC
Start: 2017-02-12 — End: 2017-02-14
  Administered 2017-02-12: 2 [IU] via SUBCUTANEOUS

## 2017-02-12 MED ORDER — HYDROXYUREA 500 MG PO CAPS: 500.0000 mg | ORAL_CAPSULE | Freq: Every day | ORAL | Status: DC

## 2017-02-12 MED ORDER — PANTOPRAZOLE SODIUM 40 MG PO TBEC
40.0000 mg | DELAYED_RELEASE_TABLET | Freq: Every day | ORAL | Status: DC
  Administered 2017-02-13 – 2017-02-14 (×2): 40 mg via ORAL
  Filled 2017-02-12 (×2): qty 1

## 2017-02-12 MED ORDER — AZITHROMYCIN 250 MG PO TABS
500.0000 mg | ORAL_TABLET | Freq: Once | ORAL | Status: AC
  Administered 2017-02-12: 500 mg via ORAL
  Filled 2017-02-12: qty 2

## 2017-02-12 MED ORDER — ACETAMINOPHEN 650 MG RE SUPP: 650.0000 mg | Freq: Four times a day (QID) | RECTAL | Status: DC | PRN

## 2017-02-12 MED ORDER — ONDANSETRON HCL 4 MG/2ML IJ SOLN: 4.0000 mg | Freq: Four times a day (QID) | INTRAMUSCULAR | Status: DC | PRN

## 2017-02-12 MED ORDER — LORAZEPAM 2 MG/ML IJ SOLN
0.5000 mg | Freq: Once | INTRAMUSCULAR | Status: AC
  Administered 2017-02-12: 0.5 mg via INTRAVENOUS
  Filled 2017-02-12: qty 1

## 2017-02-12 MED ORDER — IPRATROPIUM BROMIDE 0.02 % IN SOLN
0.5000 mg | Freq: Once | RESPIRATORY_TRACT | Status: AC
  Administered 2017-02-12: 0.5 mg via RESPIRATORY_TRACT
  Filled 2017-02-12: qty 2.5

## 2017-02-12 MED ORDER — ENSURE ENLIVE PO LIQD
237.0000 mL | Freq: Two times a day (BID) | ORAL | Status: DC
  Administered 2017-02-13: 237 mL via ORAL

## 2017-02-12 MED ORDER — LORAZEPAM 2 MG/ML IJ SOLN
0.5000 mg | INTRAMUSCULAR | Status: DC | PRN
Start: 2017-02-12 — End: 2017-02-14

## 2017-02-12 MED ORDER — ACETAMINOPHEN 325 MG PO TABS
650.0000 mg | ORAL_TABLET | Freq: Four times a day (QID) | ORAL | Status: DC | PRN
  Administered 2017-02-13: 650 mg via ORAL
  Filled 2017-02-12: qty 2

## 2017-02-12 MED ORDER — ENOXAPARIN SODIUM 40 MG/0.4ML ~~LOC~~ SOLN
40.0000 mg | SUBCUTANEOUS | Status: DC
  Administered 2017-02-12 – 2017-02-13 (×2): 40 mg via SUBCUTANEOUS
  Filled 2017-02-12 (×2): qty 0.4

## 2017-02-12 MED ORDER — DEXTROSE 5 % IV SOLN
500.0000 mg | INTRAVENOUS | Status: DC
  Administered 2017-02-13: 500 mg via INTRAVENOUS
  Filled 2017-02-12 (×4): qty 500

## 2017-02-12 MED ORDER — DOCUSATE SODIUM 100 MG PO CAPS
100.0000 mg | ORAL_CAPSULE | Freq: Two times a day (BID) | ORAL | Status: DC
  Administered 2017-02-12 – 2017-02-14 (×4): 100 mg via ORAL
  Filled 2017-02-12 (×4): qty 1

## 2017-02-12 MED ORDER — LACTATED RINGERS IV SOLN
INTRAVENOUS | Status: AC
  Administered 2017-02-12: 18:00:00 via INTRAVENOUS

## 2017-02-12 MED ORDER — ALBUTEROL SULFATE (2.5 MG/3ML) 0.083% IN NEBU
5.0000 mg | INHALATION_SOLUTION | Freq: Once | RESPIRATORY_TRACT | Status: AC
  Administered 2017-02-12: 5 mg via RESPIRATORY_TRACT
  Filled 2017-02-12: qty 6

## 2017-02-12 MED ORDER — LEVOTHYROXINE SODIUM 112 MCG PO TABS
112.0000 ug | ORAL_TABLET | Freq: Every day | ORAL | Status: DC
  Administered 2017-02-13 – 2017-02-14 (×2): 112 ug via ORAL
  Filled 2017-02-12 (×2): qty 1

## 2017-02-12 MED ORDER — ASPIRIN EC 81 MG PO TBEC
81.0000 mg | DELAYED_RELEASE_TABLET | Freq: Every day | ORAL | Status: DC
Start: 2017-02-13 — End: 2017-02-14
  Administered 2017-02-13 – 2017-02-14 (×2): 81 mg via ORAL
  Filled 2017-02-12 (×2): qty 1

## 2017-02-12 MED ORDER — GUAIFENESIN ER 600 MG PO TB12
600.0000 mg | ORAL_TABLET | Freq: Two times a day (BID) | ORAL | Status: DC
  Administered 2017-02-12 – 2017-02-14 (×4): 600 mg via ORAL
  Filled 2017-02-12 (×4): qty 1

## 2017-02-12 MED ORDER — METHYLPREDNISOLONE SODIUM SUCC 125 MG IJ SOLR
125.0000 mg | Freq: Once | INTRAMUSCULAR | Status: AC
  Administered 2017-02-12: 125 mg via INTRAVENOUS
  Filled 2017-02-12: qty 2

## 2017-02-12 MED ORDER — ONDANSETRON HCL 4 MG/2ML IJ SOLN
4.0000 mg | Freq: Once | INTRAMUSCULAR | Status: AC
  Administered 2017-02-12: 4 mg via INTRAVENOUS
  Filled 2017-02-12: qty 2

## 2017-02-12 MED ORDER — PAROXETINE HCL 20 MG PO TABS
20.0000 mg | ORAL_TABLET | Freq: Every day | ORAL | Status: DC
Start: 2017-02-13 — End: 2017-02-14
  Administered 2017-02-13 – 2017-02-14 (×2): 20 mg via ORAL
  Filled 2017-02-12 (×2): qty 1

## 2017-02-12 MED ORDER — ONDANSETRON HCL 4 MG PO TABS: 4.0000 mg | ORAL_TABLET | Freq: Four times a day (QID) | ORAL | Status: DC | PRN

## 2017-02-12 MED ORDER — ALBUTEROL SULFATE HFA 108 (90 BASE) MCG/ACT IN AERS: 2.0000 | INHALATION_SPRAY | RESPIRATORY_TRACT | 5 refills | Status: DC | PRN

## 2017-02-12 MED ORDER — ALBUTEROL SULFATE (2.5 MG/3ML) 0.083% IN NEBU: 2.5000 mg | INHALATION_SOLUTION | RESPIRATORY_TRACT | Status: DC | PRN

## 2017-02-12 MED ORDER — METHYLPREDNISOLONE SODIUM SUCC 125 MG IJ SOLR
80.0000 mg | Freq: Two times a day (BID) | INTRAMUSCULAR | Status: DC
  Administered 2017-02-13 – 2017-02-14 (×3): 80 mg via INTRAVENOUS
  Filled 2017-02-12 (×3): qty 2

## 2017-02-12 MED ORDER — GABAPENTIN 300 MG PO CAPS
300.0000 mg | ORAL_CAPSULE | Freq: Every day | ORAL | Status: DC
  Administered 2017-02-12 – 2017-02-13 (×2): 300 mg via ORAL
  Filled 2017-02-12 (×2): qty 1

## 2017-02-12 MED ORDER — IPRATROPIUM-ALBUTEROL 0.5-2.5 (3) MG/3ML IN SOLN
3.0000 mL | Freq: Four times a day (QID) | RESPIRATORY_TRACT | Status: DC
  Administered 2017-02-12 – 2017-02-13 (×3): 3 mL via RESPIRATORY_TRACT
  Filled 2017-02-12 (×3): qty 3

## 2017-02-12 MED ORDER — METOPROLOL SUCCINATE ER 25 MG PO TB24
25.0000 mg | ORAL_TABLET | Freq: Every day | ORAL | Status: DC
Start: 2017-02-13 — End: 2017-02-14
  Administered 2017-02-13 – 2017-02-14 (×2): 25 mg via ORAL
  Filled 2017-02-12 (×2): qty 1

## 2017-02-12 MED ORDER — INSULIN ASPART 100 UNIT/ML ~~LOC~~ SOLN
0.0000 [IU] | Freq: Three times a day (TID) | SUBCUTANEOUS | Status: DC
  Administered 2017-02-13: 2 [IU] via SUBCUTANEOUS
  Administered 2017-02-13: 8 [IU] via SUBCUTANEOUS
  Administered 2017-02-13 – 2017-02-14 (×2): 2 [IU] via SUBCUTANEOUS

## 2017-02-12 NOTE — ED Triage Notes (Signed)
Pt sent from [facility] for evaluation of shortness of breath and and pneumonia of left side.

## 2017-02-12 NOTE — Patient Instructions (Signed)
In a few days you may receive a survey in the mail or online from [employer] regarding your visit with us today. Please take a moment to fill this out. Your feedback is very important to our whole office. It can help us better understand your needs as well as improve your experience and satisfaction. Thank you for taking your time to complete it. We care about you.  Sinahi Knights, PA-C  

## 2017-02-12 NOTE — H&P (Signed)
History and Physical    Jordan [name] TIR:[id number] DOB: 09-May-1940 DOA: 02/12/2017  PCP: Terald Sleeper, PA-C Consultants:  Talbert Cage - oncology; Fields - GI; Young - pulmonary Patient coming from:  Home - lives with husband, daughter, her partner; NOK:  Daughter  Risk analyst Complaint: Cough, SOB  HPI: Jordan [name] is a 76 y.o. female with medical history significant of COPD (prn home O2, not using); hypothyroidism; HTN; HLD; DM; and Depression presenting with cough and SOB.  Patient reports that she "started coughing terribly, just unbelievable amounts of junk coming down through my sinuses, into my throat, choking me.  It's been terrible.  The one thing that gave me relief is a Allegra-D.  It really, truly helped me but it also made me very nervous, very very nervous, made my heart beat really fast.  So I couldn't take it more than every 3 days or so."  Symptoms have been going on for 3 weeks, but worse over the last week.  Cough is productive of very thick, slimy golden- to green- to white sputum.  She does not think she has had fevers.  Her daughter insisted that she come to the doctor; the PCP sent her to the ER.  She has been terribly SOB x 3-4 weeks.    She was previously on oxygen and breathing treatments but she was able to wean herself off those things.   ED Course:  COPD exacerbation.  PCP sent in.  67-70% wth exertion but more comfortable at rest.  Given 10 mg Albuterol/Atrovent, still very tight and unable to get OOB to bathroom.  Negative CXR.  Flu negative.  Given Solumedrol.  Review of Systems: As per HPI; otherwise review of systems reviewed and negative.   Ambulatory Status:  Ambulates with a cane  Past Medical History:  Diagnosis Date  . Anxiety   . Arthritis   . Cancer [facility])    Skin cancer  . Cataracts, both eyes 05/2014  . COPD (chronic obstructive pulmonary disease) (HCC)    intermittent home O2 - rarely uses  . Depression   . Diabetes mellitus without complication ([place])   .  GERD (gastroesophageal reflux disease)   . Hyperkalemia   . Hyperlipidemia   . Hypertension   . Seizures ([place])    From RH Negative blood when pregnant  . Stroke ([place])   . Thrombocytosis ([place])   . Thyroid disease     Past Surgical History:  Procedure Laterality Date  . ABDOMINAL HYSTERECTOMY    . ANKLE SURGERY Right   . BREAST SURGERY     Bx, none malignant  . COLONOSCOPY  2013   normal per patient  . COLONOSCOPY N/A 05/14/2016   Procedure: COLONOSCOPY;  Surgeon: Danie Binder, MD;  Location[facility];  Service: Endoscopy;  Laterality: N/A;  8:30AM  . ESOPHAGOGASTRODUODENOSCOPY N/A 01/14/2016   Dr. Oneida Alar: normal esophagus, single hyperplastic gastric polyp, normal duodenum, negative H.pylori  . ROTATOR CUFF REPAIR Right   . Skin Cancers    . TONSILLECTOMY    . TONSILLECTOMY      Social History   Socioeconomic History  . Marital status: Married    Spouse name: Not on file  . Number of children: Not on file  . Years of education: Not on file  . Highest education level: Not on file  Social Needs  . Financial resource strain: Not on file  . Food insecurity - worry: Not on file  . Food insecurity - inability: Not on  file  . Transportation needs - medical: Not on file  . Transportation needs - non-medical: Not on file  Occupational History  . Occupation: retired  Tobacco Use  . Smoking status: Former Smoker    Packs/day: 3.00    Years: 36.00    Pack years: 108.00    Types: Cigarettes    Last attempt to quit: 03/10/2000    Years since quitting: 16.9  . Smokeless tobacco: Never Used  Substance and Sexual Activity  . Alcohol use: No  . Drug use: No  . Sexual activity: No  Other Topics Concern  . Not on file  Social History Narrative  . Not on file    Allergies  Allergen Reactions  . Bee Venom Anaphylaxis  . Codeine Itching  . Lipitor [Atorvastatin]     Extreme Dry Mouth, joint pain  . Penicillins Hives    Has patient had a PCN reaction causing immediate  rash, facial/tongue/throat swelling, SOB or lightheadedness with hypotension: No Has patient had a PCN reaction causing severe rash involving mucus membranes or skin necrosis: Yes Has patient had a PCN reaction that required hospitalization: No Has patient had a PCN reaction occurring within the last 10 years: No If all of the above answers are "NO", then may proceed with Cephalosporin use.   . Demeclocycline Rash  . Morphine And Related Rash  . Tetracyclines & Related Rash    Family History  Problem Relation Age of Onset  . Alzheimer's disease Mother   . Arthritis Father   . Stroke Maternal Aunt     Prior to Admission medications   Medication Sig Start Date End Date Taking? Authorizing Provider  albuterol (VENTOLIN HFA) 108 (90 Base) MCG/ACT inhaler Inhale 2 puffs into the lungs every 4 (four) hours as needed for wheezing or shortness of breath. 02/12/17  Yes Terald Sleeper, PA-C  aspirin EC 81 MG tablet Take 81 mg by mouth daily.   Yes [provider]  fexofenadine-pseudoephedrine (ALLEGRA-D 24) 180-240 MG 24 hr tablet Take 1 tablet by mouth daily as needed (allergies).   Yes [provider]  gabapentin (NEURONTIN) 300 MG capsule TAKE 1 CAPSULE (300 MG TOTAL) BY MOUTH AT BEDTIME. 02/02/17  Yes Gottschalk, Ashly M, DO  hydroxyurea (HYDREA) 500 MG capsule TAKE 2 CAPSULES ON MON-TUES-WED-FRI-SAT. Take 1 po on thurs and sunday 01/09/17  Yes Twana First, MD  levothyroxine (SYNTHROID, LEVOTHROID) 112 MCG tablet TAKE 1 TABLET BY MOUTH EVERY DAY 02/06/17  Yes Chipper Herb, MD  metFORMIN (GLUCOPHAGE) 500 MG tablet TAKE 1 TABLET (500 MG TOTAL) BY MOUTH 2 (TWO) TIMES DAILY WITH A MEAL. 02/06/17  Yes Chipper Herb, MD  metoprolol succinate (TOPROL-XL) 25 MG 24 hr tablet TAKE 1 TABLET BY MOUTH EVERY DAY 02/02/17  Yes Ronnie Doss M, DO  Multiple Vitamins-Minerals (ICAPS AREDS 2 PO) Take 1 capsule by mouth 2 (two) times daily.   Yes [provider]  omeprazole  (PRILOSEC) 20 MG capsule TAKE ONE CAPSULE BY MOUTH EVERY MORNING 30 MINUTES PRIOR TO YOUR FIRST MEAL 02/04/17  Yes Mahala Menghini, PA-C  PARoxetine (PAXIL) 20 MG tablet TAKE 1 TABLET BY MOUTH EVERY DAY 02/02/17  Yes Gottschalk, Ashly M, DO  promethazine (PHENERGAN) 12.5 MG tablet Take 1 tablet (12.5 mg total) by mouth every 8 (eight) hours as needed for nausea or vomiting. 04/16/16  Yes Annitta Needs, NP    Physical Exam: Vitals:   02/12/17 1326 02/12/17 1436 02/12/17 1747 02/12/17 1748  BP:   Marland Kitchen)  130/59   Pulse:   75   Resp:      Temp:   98.6 F (37 C)   TempSrc:   Oral   SpO2: 96% 98% 99%   Weight:    77.1 kg (170 lb)  Height:    5\' 6"  (1.676 m)     General:  Appears calm and comfortable and is NAD Eyes:  PERRL, EOMI, normal lids, iris ENT:  grossly normal hearing, lips & tongue, mmm; wears dentures Neck:  no LAD, masses or thyromegaly Cardiovascular:  RRR, no m/r/g. No LE edema.  Respiratory:   Scattered wheezes, moderate air movement, very tight cough.  Normal respiratory effort. Abdomen:  soft, NT, ND, NABS Skin:  no rash or induration seen on limited exam Musculoskeletal:  grossly normal tone BUE/BLE, good ROM, no bony abnormality Psychiatric:  grossly normal mood and affect, speech fluent and appropriate, AOx3 Neurologic:  CN 2-12 grossly intact, moves all extremities in coordinated fashion, sensation intact    Radiological Exams on Admission: Dg Chest 2 View  Result Date: 02/12/2017 CLINICAL DATA:  Productive cough, shortness of breath and chest cramping for the past week. Clinical pneumonia. Former smoker. History of COPD, diabetes, and hypertension. EXAM: CHEST  2 VIEW COMPARISON:  PA and lateral chest x-ray dated December 18, 2014 and June 08, 2014. FINDINGS: The lungs are mildly hyperinflated. The interstitial markings are coarse though stable. There is no alveolar infiltrate or pleural effusion. The heart and pulmonary vascularity are normal. There is calcification in  the wall of the aortic arch. There is biapical pleural thickening which is stable. The bony thorax exhibits no acute abnormality. There is a breast implant on the right exhibiting capsular calcification. IMPRESSION: COPD.  No pneumonia nor CHF. Thoracic aortic atherosclerosis. Electronically Signed   By: David  Martinique M.D.   On: 02/12/2017 12:38    EKG: Independently reviewed.  NSR with rate 63; nonspecific ST changes with no evidence of acute ischemia   Labs on Admission: I have personally reviewed the available labs and imaging studies at the time of the admission.  Pertinent labs:   Glucose 105 Troponin <0.03 WBC 6.1 Influenza negative  Assessment/Plan Principal Problem:   COPD exacerbation (HCC) Active Problems:   Diabetes mellitus without complication (HCC)   Hypertension   Hypothyroidism   Thrombocytosis (HCC)   Acute on chronic respiratory failure associated with a COPD exacerbation -Patient's shortness of breath and productive cough are most likely caused by acute COPD exacerbation.  -She has history of O2-dependent COPD but has weaned herself off nebs and O2 -She does not have fever or leukocytosis. Chest x-ray is not consistent with pneumonia -She was given a continuous neb treatment in the ED with some improvement.  However, it made her extremely anxious and she required Ativan to calm down. -will admit patient - with his failure of outpatient therapy and markedly decreased O2 sats (into the 60-70s), it seems likely that she will need several days of hospitalization to show sufficient improvement for discharge. -Nebulizers: scheduled Duoneb and prn albuterol -Solu-Medrol 80 mg IV BID  -IV Azithromycin (has 3/3 cardinal symptoms)  -PRN Ativan due to anxiety associated with albuterol  Thrombocytosis -She is taking hydroxyurea for this and her platelets are normal today -Outpatient f/u as scheduled  Hypothyroidism -Check TSH and free T4 -Continue Synthroid at current  dose for now  DM -Will check A1c -hold Glucophage -Cover with moderate-scale SSI  HTN -Continue Toprol XL   DVT prophylaxis: Lovenox  Code  Status:  DNR - confirmed with patient Family Communication: None present Disposition Plan:  Home once clinically improved Consults called: CM/SW/PT/OT/Nutrition/RT  Admission status: Admit - It is my clinical opinion that admission to INPATIENT is reasonable and necessary because this patient will require at least 2 midnights in the hospital to treat this condition based on the medical complexity of the problems presented.  Given the aforementioned information, the predictability of an adverse outcome is felt to be significant.    Karmen Bongo MD [facility]  If note is complete, please contact covering daytime or nighttime physician. [email or web address] Password [facility]  02/12/2017, 6:27 PM

## 2017-02-12 NOTE — ED Provider Notes (Signed)
[facility] EMERGENCY DEPARTMENT Provider Note   CSN: [id number] Arrival date & time: 02/12/17  1141     History   Chief Complaint Chief Complaint  Patient presents with  . Shortness of Breath    HPI Jordan [name] is a 76 y.o. female.  The history is provided by the patient.  Shortness of Breath  This is a new problem. The problem occurs continuously.The current episode started more than 2 days ago. The problem has been gradually worsening. Associated symptoms include coryza, rhinorrhea, sore throat, cough, sputum production and wheezing. Pertinent negatives include no fever, no chest pain, no vomiting, no abdominal pain, no leg pain and no leg swelling. It is unknown what precipitated the problem. She has tried ipratropium inhalers and beta-agonist inhalers for the symptoms. The treatment provided mild relief. Associated medical issues include COPD.   76 year old female who presents with shortness of breath.  She has a history of COPD, prior CVA, hypertension, hyperlipidemia, and diabetes.  States that she has as needed oxygen, but has not needed it for 1 year.  States that for the last 5-6 days she has had increasing cough, sputum production, shortness of breath.  Was seen by her primary care doctor today and sent in to the ED for pneumonia.  She has not had known fevers or chills, vomiting, diarrhea, abdominal pain, or urinary complaints.  Has been using home inhalers with mild relief.   Past Medical History:  Diagnosis Date  . Anxiety   . Arthritis   . Cancer [facility])    Skin cancer  . Cataracts, both eyes 05/2014  . COPD (chronic obstructive pulmonary disease) (HCC)    intermittent home O2 - rarely uses  . Depression   . Diabetes mellitus without complication ([place])   . GERD (gastroesophageal reflux disease)   . Hyperlipidemia   . Hypertension   . Seizures ([place])    From RH Negative bvlood when pregnant  . Stroke ([place])   . Thyroid disease     Patient Active Problem List   Diagnosis Date Noted  . Pneumonia 02/12/2017  . Dysphonia 06/08/2016  . 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 Iron deficiency anemia   . Absolute anemia 04/16/2016  . 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 Pain of upper abdomen   . Sinusitis 12/10/2015  . GERD (gastroesophageal reflux disease) 12/10/2015  . Hoarseness 12/10/2015  . Diarrhea 12/10/2015  . Thrombocytosis ([place]) 12/10/2015  . Dysphagia 07/25/2015  . Otitis media, acute 07/25/2015  . 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 Anxiety   . Depression   . Diabetes mellitus without complication ([place])   . Hyperlipidemia   . Hypertension   . Thyroid disease   . Seasonal allergic rhinitis 08/18/2013  . 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 ABDOMINAL HYSTERECTOMY    . ANKLE SURGERY Right   . BREAST SURGERY     Bx, none malignant  . COLONOSCOPY  2013   normal per patient  . COLONOSCOPY N/A 05/14/2016   Procedure: COLONOSCOPY;  Surgeon: Danie Binder, MD;  Location: [facility]O SU[facility];  Service: Endoscopy;  Laterality: N/A;  8:30AM  . ESOPHAGOGASTRODUODENOSCOPY N/A 01/14/2016   Dr. Oneida Alar: normal esophagus, single hyperplastic gastric polyp, normal duodenum, negative H.pylori  . ROTATOR CUFF REPAIR Right   . Skin Cancers    . TONSILLECTOMY    . TONSILLECTOMY      OB History    No data available  Home Medications    Prior to Admission medications   Medication Sig Start Date End Date Taking? Authorizing Provider  albuterol (VENTOLIN HFA) 108 (90 Base) MCG/ACT inhaler Inhale 2 puffs into the lungs every 4 (four) hours as needed for wheezing or shortness of breath. 02/12/17  Yes Terald Sleeper, PA-C  aspirin EC 81 MG tablet Take 81 mg by mouth daily.   Yes [provider]  fexofenadine-pseudoephedrine (ALLEGRA-D 24) 180-240 MG 24 hr tablet Take 1 tablet by mouth daily as needed (allergies).   Yes [provider]  gabapentin  (NEURONTIN) 300 MG capsule TAKE 1 CAPSULE (300 MG TOTAL) BY MOUTH AT BEDTIME. 02/02/17  Yes Gottschalk, Ashly M, DO  hydroxyurea (HYDREA) 500 MG capsule TAKE 2 CAPSULES ON MON-TUES-WED-FRI-SAT. Take 1 po on thurs and sunday 01/09/17  Yes Twana First, MD  levothyroxine (SYNTHROID, LEVOTHROID) 112 MCG tablet TAKE 1 TABLET BY MOUTH EVERY DAY 02/06/17  Yes Chipper Herb, MD  metFORMIN (GLUCOPHAGE) 500 MG tablet TAKE 1 TABLET (500 MG TOTAL) BY MOUTH 2 (TWO) TIMES DAILY WITH A MEAL. 02/06/17  Yes Chipper Herb, MD  metoprolol succinate (TOPROL-XL) 25 MG 24 hr tablet TAKE 1 TABLET BY MOUTH EVERY DAY 02/02/17  Yes Ronnie Doss M, DO  Multiple Vitamins-Minerals (ICAPS AREDS 2 PO) Take 1 capsule by mouth 2 (two) times daily.   Yes [provider]  omeprazole (PRILOSEC) 20 MG capsule TAKE ONE CAPSULE BY MOUTH EVERY MORNING 30 MINUTES PRIOR TO YOUR FIRST MEAL 02/04/17  Yes Mahala Menghini, PA-C  PARoxetine (PAXIL) 20 MG tablet TAKE 1 TABLET BY MOUTH EVERY DAY 02/02/17  Yes Gottschalk, Ashly M, DO  promethazine (PHENERGAN) 12.5 MG tablet Take 1 tablet (12.5 mg total) by mouth every 8 (eight) hours as needed for nausea or vomiting. 04/16/16  Yes Annitta Needs, NP    Family History Family History  Problem Relation Age of Onset  . Alzheimer's disease Mother   . Arthritis Father   . Stroke Maternal Aunt     Social History Social History   Tobacco Use  . Smoking status: Former Smoker    Packs/day: 3.00    Years: 36.00    Pack years: 108.00    Types: Cigarettes    Last attempt to quit: 03/10/2000    Years since quitting: 16.9  . Smokeless tobacco: Never Used  Substance Use Topics  . Alcohol use: No  . Drug use: No     Allergies   Bee venom; Codeine; Lipitor [atorvastatin]; Penicillins; Demeclocycline; Morphine and related; and Tetracyclines & related   Review of Systems Review of Systems  Constitutional: Negative for fever.  HENT: Positive for rhinorrhea and sore throat.     Respiratory: Positive for cough, sputum production, shortness of breath and wheezing.   Cardiovascular: Negative for chest pain and leg swelling.  Gastrointestinal: Negative for abdominal pain and vomiting.  All other systems reviewed and are negative.    Physical Exam Updated Vital Signs BP (!) 148/83 (BP Location: Right Arm)   Pulse 60   Temp 97.9 F (36.6 C) (Oral)   Resp 18   Ht 5\' 6"  (1.676 m)   Wt 76.2 kg (168 lb)   SpO2 98%   BMI 27.12 kg/m   Physical Exam Physical Exam  Nursing note and vitals reviewed. Constitutional: Well developed, well nourished, non-toxic, and in no acute distress at rest Head: Normocephalic and atraumatic.  Mouth/Throat: Oropharynx is clear and moist.  Neck: Normal range of motion. Neck supple.  Cardiovascular: Normal rate and regular rhythm.   Pulmonary/Chest: Effort normal. Speaks in full sentences. Diffuse expiratory wheezing in all lung fields.  Abdominal: Soft. There is no tenderness. There is no rebound and no guarding.  Musculoskeletal: Normal range of motion. No edema Neurological: Alert, no facial droop, fluent speech, moves all extremities symmetrically Skin: Skin is warm and dry.  Psychiatric: Cooperative   ED Treatments / Results  Labs (all labs ordered are listed, but only abnormal results are displayed) Labs Reviewed  CBC WITH DIFFERENTIAL/PLATELET - Abnormal; Notable for the following components:      Result Value   RBC 3.65 (*)    MCV 109.9 (*)    MCH 37.0 (*)    All other components within normal limits  BASIC METABOLIC PANEL - Abnormal; Notable for the following components:   Chloride 100 (*)    Glucose, Bld 105 (*)    All other components within normal limits  TROPONIN I  INFLUENZA PANEL BY PCR (TYPE A & B)    EKG  EKG Interpretation  Date/Time:  Thursday February 12 2017 12:58:01 EST Ventricular Rate:  63 PR Interval:    QRS Duration: 96 QT Interval:  428 QTC Calculation: 435 R Axis:   -22 Text  Interpretation:  Sinus rhythm Borderline left axis deviation Abnormal T, consider ischemia, diffuse leads t-wave abnormalities seen on previous EKG 12/18/2014  Confirmed by Brantley Stage [phone]) on 02/12/2017 1:31:55 PM       Radiology Dg Chest 2 View  Result Date: 02/12/2017 CLINICAL DATA:  Productive cough, shortness of breath and chest cramping for the past week. Clinical pneumonia. Former smoker. History of COPD, diabetes, and hypertension. EXAM: CHEST  2 VIEW COMPARISON:  PA and lateral chest x-ray dated December 18, 2014 and June 08, 2014. FINDINGS: The lungs are mildly hyperinflated. The interstitial markings are coarse though stable. There is no alveolar infiltrate or pleural effusion. The heart and pulmonary vascularity are normal. There is calcification in the wall of the aortic arch. There is biapical pleural thickening which is stable. The bony thorax exhibits no acute abnormality. There is a breast implant on the right exhibiting capsular calcification. IMPRESSION: COPD.  No pneumonia nor CHF. Thoracic aortic atherosclerosis. Electronically Signed   By: David  Martinique M.D.   On: 02/12/2017 12:38    Procedures Procedures (including critical care time)  Medications Ordered in ED Medications  albuterol (PROVENTIL) (2.5 MG/3ML) 0.083% nebulizer solution 5 mg (5 mg Nebulization Given 02/12/17 1326)  ipratropium (ATROVENT) nebulizer solution 0.5 mg (0.5 mg Nebulization Given 02/12/17 1325)  methylPREDNISolone sodium succinate (SOLU-MEDROL) 125 mg/2 mL injection 125 mg (125 mg Intravenous Given 02/12/17 1254)  ondansetron (ZOFRAN) injection 4 mg (4 mg Intravenous Given 02/12/17 1254)  azithromycin (ZITHROMAX) tablet 500 mg (500 mg Oral Given 02/12/17 1359)  albuterol (PROVENTIL) (2.5 MG/3ML) 0.083% nebulizer solution 5 mg (5 mg Nebulization Given 02/12/17 1436)  LORazepam (ATIVAN) injection 0.5 mg (0.5 mg Intravenous Given 02/12/17 1421)     Initial Impression / Assessment and Plan / ED Course  I  have reviewed the triage vital signs and the nursing notes.  Pertinent labs & imaging results that were available during my care of the patient were reviewed by me and considered in my medical decision making (see chart for details).     76 year old female who presents with shortness of breath, cough and sputum production.  She is afebrile, hemodynamically stable.  According to patient's nurse when she had walked in through triage on room air  her pulse ox was in the 60s to 70%.  Once I saw her, she was on 2 L nasal cannula and at rest, comfortably breathing.  Very poor air movement with diffuse expiratory wheezing.  Did not appear fluid overloaded.  Chest x-ray visualized and does not show any acute cardiopulmonary processes including pneumonia or edema.  EKG is nonischemic and she has a normal troponin.  Blood work otherwise unremarkable.  She is influenza negative.  She has received 125 mg of Solu-Medrol, 10 mg of albuterol and 0.5 mg of Atrovent.  Is moving more air but continued to have wheezing and shortness of breath.  Continues to be on 2 L nasal cannula.  Discussed with Dr. Lorin Mercy will admit for COPD exacerbation treatment.  Final Clinical Impressions(s) / ED Diagnoses   Final diagnoses:  Acute respiratory failure with hypoxia ([place])  Acute exacerbation of chronic obstructive pulmonary disease (COPD) [facility])    ED Discharge Orders    None       Forde Dandy, MD 02/12/17 1606

## 2017-02-12 NOTE — Progress Notes (Signed)
BP 126/67   Pulse 74   Temp (!) 97 F (36.1 C) (Oral)   Ht 5\' 6"  (1.676 m)   Wt 168 lb (76.2 kg)   SpO2 92%   BMI 27.12 kg/m    Subjective:    Patient ID: Jordan [name], female    DOB: 05/28/1940, 76 y.o.   MRN: [id number]  HPI: Jordan [name] is a 76 y.o. female presenting on 02/12/2017 for Cough and Nasal Congestion  This patient has known COPD and has been very sick for a few weeks.  She is extremely short of breath.  Last night she is states that "thought she would die."  She was very short of breath in the room.  She does have several medications that she is very allergic to.  She states that when she takes prednisone it has caused her to have a wall mouth and has not taken in some time.  She has copious production that is green yellow in color there is occasional blood in the morning sputum.  Relevant past medical, surgical, family and social history reviewed and updated as indicated. Allergies and medications reviewed and updated.  Past Medical History:  Diagnosis Date  . Anxiety   . Arthritis   . Asthma   . Cancer [facility])    Skin cancer  . Cataract   . Cataracts, both eyes 05/2014  . COPD (chronic obstructive pulmonary disease) ([place])   . Depression   . Diabetes mellitus without complication ([place])   . GERD (gastroesophageal reflux disease)   . Hyperlipidemia   . Hypertension   . Seizures ([place])    From RH Negative bvlood when pregnant  . Stroke ([place])   . Thyroid disease     Past Surgical History:  Procedure Laterality Date  . ABDOMINAL HYSTERECTOMY    . ANKLE SURGERY Right   . BREAST SURGERY     Bx, none malignant  . COLONOSCOPY  2013   normal per patient  . COLONOSCOPY N/A 05/14/2016   Procedure: COLONOSCOPY;  Surgeon: Danie Binder, MD;  Location: [facility];  Service: Endoscopy;  Laterality: N/A;  8:30AM  . ESOPHAGOGASTRODUODENOSCOPY N/A 01/14/2016   Dr. Oneida Alar: normal esophagus, single hyperplastic gastric polyp, normal duodenum, negative H.pylori  . ROTATOR  CUFF REPAIR Right   . Skin Cancers    . TONSILLECTOMY    . TONSILLECTOMY      Review of Systems  Constitutional: Positive for chills, fatigue and fever. Negative for activity change and appetite change.  HENT: Positive for congestion, postnasal drip, sinus pressure, sinus pain and sore throat.   Eyes: Negative.   Respiratory: Positive for cough, chest tightness, shortness of breath and wheezing. Negative for choking and stridor.   Cardiovascular: Negative.  Negative for chest pain, palpitations and leg swelling.  Gastrointestinal: Negative.   Genitourinary: Negative.   Musculoskeletal: Negative.   Skin: Negative.   Neurological: Positive for headaches.    Allergies as of 02/12/2017      Reactions   Bee Venom Anaphylaxis   Codeine Itching   Lipitor [atorvastatin]    Extreme Dry Mouth, joint pain   Penicillins Hives   Has patient had a PCN reaction causing immediate rash, facial/tongue/throat swelling, SOB or lightheadedness with hypotension: No Has patient had a PCN reaction causing severe rash involving mucus membranes or skin necrosis: Yes Has patient had a PCN reaction that required hospitalization: No Has patient had a PCN reaction occurring within the last 10 years: No If all of the above  answers are "NO", then may proceed with Cephalosporin use.   Demeclocycline Rash   Morphine And Related Rash   Tetracyclines & Related Rash      Medication List        Accurate as of 02/12/17 10:52 AM. Always use your most recent med list.          albuterol 108 (90 Base) MCG/ACT inhaler Commonly known as:  VENTOLIN HFA Inhale 2 puffs into the lungs every 4 (four) hours as needed for wheezing or shortness of breath.   aspirin EC 81 MG tablet Take 81 mg by mouth daily.   fexofenadine-pseudoephedrine 180-240 MG 24 hr tablet Commonly known as:  ALLEGRA-D 24 Take 1 tablet by mouth daily as needed (allergies).   gabapentin 300 MG capsule Commonly known as:  NEURONTIN TAKE 1  CAPSULE (300 MG TOTAL) BY MOUTH AT BEDTIME.   hydroxyurea 500 MG capsule Commonly known as:  HYDREA TAKE 2 CAPSULES ON MON-TUES-WED-FRI-SAT. Take 1 po on thurs and sunday   ICAPS AREDS 2 PO Take 1 capsule by mouth 2 (two) times daily.   levothyroxine 112 MCG tablet Commonly known as:  SYNTHROID, LEVOTHROID TAKE 1 TABLET BY MOUTH EVERY DAY   metFORMIN 500 MG tablet Commonly known as:  GLUCOPHAGE TAKE 1 TABLET (500 MG TOTAL) BY MOUTH 2 (TWO) TIMES DAILY WITH A MEAL.   metoprolol succinate 25 MG 24 hr tablet Commonly known as:  TOPROL-XL TAKE 1 TABLET BY MOUTH EVERY DAY   omeprazole 20 MG capsule Commonly known as:  PRILOSEC TAKE ONE CAPSULE BY MOUTH EVERY MORNING 30 MINUTES PRIOR TO YOUR FIRST MEAL   PARoxetine 20 MG tablet Commonly known as:  PAXIL TAKE 1 TABLET BY MOUTH EVERY DAY   promethazine 12.5 MG tablet Commonly known as:  PHENERGAN Take 1 tablet (12.5 mg total) by mouth every 8 (eight) hours as needed for nausea or vomiting.          Objective:    BP 126/67   Pulse 74   Temp (!) 97 F (36.1 C) (Oral)   Ht 5\' 6"  (1.676 m)   Wt 168 lb (76.2 kg)   SpO2 92%   BMI 27.12 kg/m   Allergies  Allergen Reactions  . Bee Venom Anaphylaxis  . Codeine Itching  . Lipitor [Atorvastatin]     Extreme Dry Mouth, joint pain  . Penicillins Hives    Has patient had a PCN reaction causing immediate rash, facial/tongue/throat swelling, SOB or lightheadedness with hypotension: No Has patient had a PCN reaction causing severe rash involving mucus membranes or skin necrosis: Yes Has patient had a PCN reaction that required hospitalization: No Has patient had a PCN reaction occurring within the last 10 years: No If all of the above answers are "NO", then may proceed with Cephalosporin use.   . Demeclocycline Rash  . Morphine And Related Rash  . Tetracyclines & Related Rash    Physical Exam  Constitutional: She is oriented to person, place, and time. She appears  well-developed and well-nourished.  HENT:  Head: Normocephalic and atraumatic.  Right Ear: There is drainage and tenderness.  Left Ear: There is drainage and tenderness.  Nose: Mucosal edema and rhinorrhea present. Right sinus exhibits no maxillary sinus tenderness and no frontal sinus tenderness. Left sinus exhibits no maxillary sinus tenderness and no frontal sinus tenderness.  Mouth/Throat: Oropharyngeal exudate and posterior oropharyngeal erythema present.  Eyes: Conjunctivae and EOM are normal. Pupils are equal, round, and reactive to light.  Neck: Normal  range of motion. Neck supple.  Cardiovascular: Normal rate, regular rhythm, normal heart sounds and intact distal pulses.  Pulmonary/Chest: No accessory muscle usage. She is in respiratory distress. She has wheezes in the right upper field and the left upper field. She has rhonchi in the left middle field and the left lower field.      Abdominal: Soft. Bowel sounds are normal.  Neurological: She is alert and oriented to person, place, and time. She has normal reflexes.  Skin: Skin is warm and dry. No rash noted.  Psychiatric: She has a normal mood and affect. Her behavior is normal. Judgment and thought content normal.    Results for orders placed or performed in visit on 01/09/17  CBC with Differential  Result Value Ref Range   WBC 6.5 4.0 - 10.5 K/uL   RBC 3.65 (L) 3.87 - 5.11 MIL/uL   Hemoglobin 13.3 12.0 - 15.0 g/dL   HCT 40.8 36.0 - 46.0 %   MCV 111.8 (H) 78.0 - 100.0 fL   MCH 36.4 (H) 26.0 - 34.0 pg   MCHC 32.6 30.0 - 36.0 g/dL   RDW 14.0 11.5 - 15.5 %   Platelets 419 (H) 150 - 400 K/uL   Neutrophils Relative % 52 %   Neutro Abs 3.4 1.7 - 7.7 K/uL   Lymphocytes Relative 29 %   Lymphs Abs 1.9 0.7 - 4.0 K/uL   Monocytes Relative 13 %   Monocytes Absolute 0.8 0.1 - 1.0 K/uL   Eosinophils Relative 5 %   Eosinophils Absolute 0.4 0.0 - 0.7 K/uL   Basophils Relative 1 %   Basophils Absolute 0.1 0.0 - 0.1 K/uL    Comprehensive metabolic panel  Result Value Ref Range   Sodium 139 135 - 145 mmol/L   Potassium 5.5 (H) 3.5 - 5.1 mmol/L   Chloride 100 (L) 101 - 111 mmol/L   CO2 30 22 - 32 mmol/L   Glucose, Bld 120 (H) 65 - 99 mg/dL   BUN 11 6 - 20 mg/dL   Creatinine, Ser 0.89 0.44 - 1.00 mg/dL   Calcium 9.5 8.9 - 10.3 mg/dL   Total Protein 7.4 6.5 - 8.1 g/dL   Albumin 4.4 3.5 - 5.0 g/dL   AST 19 15 - 41 U/L   ALT 8 (L) 14 - 54 U/L   Alkaline Phosphatase 81 38 - 126 U/L   Total Bilirubin 0.6 0.3 - 1.2 mg/dL   GFR calc non Af Amer >60 >60 mL/min   GFR calc Af Amer >60 >60 mL/min   Anion gap 9 5 - 15      Assessment & Plan:   1. Pneumonia of left lower lobe due to infectious organism ([place]) - albuterol (VENTOLIN HFA) 108 (90 Base) MCG/ACT inhaler; Inhale 2 puffs into the lungs every 4 (four) hours as needed for wheezing or shortness of breath.  Dispense: 18 Inhaler; Refill: 5  2. Hypoxia Prior need for oxygen On room air 92% Extremely dyspneic after walking  3. Respiratory distress Advised emergency room at [facility] for probable admission   Current Outpatient Medications:  .  albuterol (VENTOLIN HFA) 108 (90 Base) MCG/ACT inhaler, Inhale 2 puffs into the lungs every 4 (four) hours as needed for wheezing or shortness of breath., Disp: 18 Inhaler, Rfl: 5 .  aspirin EC 81 MG tablet, Take 81 mg by mouth daily., Disp: , Rfl:  .  fexofenadine-pseudoephedrine (ALLEGRA-D 24) 180-240 MG 24 hr tablet, Take 1 tablet by mouth daily as needed (  allergies)., Disp: , Rfl:  .  gabapentin (NEURONTIN) 300 MG capsule, TAKE 1 CAPSULE (300 MG TOTAL) BY MOUTH AT BEDTIME., Disp: 30 capsule, Rfl: 1 .  hydroxyurea (HYDREA) 500 MG capsule, TAKE 2 CAPSULES ON MON-TUES-WED-FRI-SAT. Take 1 po on thurs and sunday, Disp: 48 capsule, Rfl: 4 .  levothyroxine (SYNTHROID, LEVOTHROID) 112 MCG tablet, TAKE 1 TABLET BY MOUTH EVERY DAY, Disp: 90 tablet, Rfl: 0 .  metFORMIN (GLUCOPHAGE) 500 MG tablet, TAKE 1 TABLET (500  MG TOTAL) BY MOUTH 2 (TWO) TIMES DAILY WITH A MEAL., Disp: 60 tablet, Rfl: 0 .  metoprolol succinate (TOPROL-XL) 25 MG 24 hr tablet, TAKE 1 TABLET BY MOUTH EVERY DAY, Disp: 90 tablet, Rfl: 0 .  Multiple Vitamins-Minerals (ICAPS AREDS 2 PO), Take 1 capsule by mouth 2 (two) times daily., Disp: , Rfl:  .  omeprazole (PRILOSEC) 20 MG capsule, TAKE ONE CAPSULE BY MOUTH EVERY MORNING 30 MINUTES PRIOR TO YOUR FIRST MEAL, Disp: 31 capsule, Rfl: 11 .  PARoxetine (PAXIL) 20 MG tablet, TAKE 1 TABLET BY MOUTH EVERY DAY, Disp: 30 tablet, Rfl: 1 .  promethazine (PHENERGAN) 12.5 MG tablet, Take 1 tablet (12.5 mg total) by mouth every 8 (eight) hours as needed for nausea or vomiting., Disp: 30 tablet, Rfl: 0 Continue all other maintenance medications as listed above.  Follow up plan: Follow-up as needed or worsening of symptoms. Call office for any issues.   Educational handout given for [place] PA-C [place] [address]  [place], [place] [address] [phone]   02/12/2017, 10:52 AM

## 2017-02-13 DIAGNOSIS — J441 Chronic obstructive pulmonary disease with (acute) exacerbation: Principal | ICD-10-CM

## 2017-02-13 DIAGNOSIS — J9621 Acute and chronic respiratory failure with hypoxia: Secondary | ICD-10-CM

## 2017-02-13 LAB — CBC
HEMATOCRIT: 39.6 % (ref 36.0–46.0)
Hemoglobin: 12.6 g/dL (ref 12.0–15.0)
MCH: 36.1 pg — ABNORMAL HIGH (ref 26.0–34.0)
MCHC: 31.8 g/dL (ref 30.0–36.0)
MCV: 113.5 fL — AB (ref 78.0–100.0)
Platelets: 363 10*3/uL (ref 150–400)
RBC: 3.49 MIL/uL — ABNORMAL LOW (ref 3.87–5.11)
RDW: 13.8 % (ref 11.5–15.5)
WBC: 7.7 10*3/uL (ref 4.0–10.5)

## 2017-02-13 LAB — GLUCOSE, CAPILLARY
GLUCOSE-CAPILLARY: 148 mg/dL — AB (ref 65–99)
GLUCOSE-CAPILLARY: 167 mg/dL — AB (ref 65–99)
Glucose-Capillary: 146 mg/dL — ABNORMAL HIGH (ref 65–99)
Glucose-Capillary: 262 mg/dL — ABNORMAL HIGH (ref 65–99)

## 2017-02-13 LAB — BASIC METABOLIC PANEL
ANION GAP: 12 (ref 5–15)
BUN: 14 mg/dL (ref 6–20)
CO2: 23 mmol/L (ref 22–32)
Calcium: 9 mg/dL (ref 8.9–10.3)
Chloride: 99 mmol/L — ABNORMAL LOW (ref 101–111)
Creatinine, Ser: 0.86 mg/dL (ref 0.44–1.00)
GFR calc non Af Amer: >60 mL/min (ref >60)
Glucose, Bld: 194 mg/dL — ABNORMAL HIGH (ref 65–99)
Potassium: 3.9 mmol/L (ref 3.5–5.1)
SODIUM: 134 mmol/L — AB (ref 135–145)

## 2017-02-13 MED ORDER — LEVALBUTEROL HCL 0.63 MG/3ML IN NEBU
0.6300 mg | INHALATION_SOLUTION | Freq: Four times a day (QID) | RESPIRATORY_TRACT | Status: DC
  Administered 2017-02-13 (×2): 0.63 mg via RESPIRATORY_TRACT
  Filled 2017-02-13 (×2): qty 3

## 2017-02-13 MED ORDER — IPRATROPIUM BROMIDE 0.02 % IN SOLN
0.5000 mg | Freq: Four times a day (QID) | RESPIRATORY_TRACT | Status: DC
  Administered 2017-02-14: 0.5 mg via RESPIRATORY_TRACT
  Filled 2017-02-13: qty 2.5

## 2017-02-13 MED ORDER — IPRATROPIUM BROMIDE 0.02 % IN SOLN
0.5000 mg | Freq: Four times a day (QID) | RESPIRATORY_TRACT | Status: DC
  Administered 2017-02-13 (×2): 0.5 mg via RESPIRATORY_TRACT
  Filled 2017-02-13 (×2): qty 2.5

## 2017-02-13 MED ORDER — HYDROXYUREA 500 MG PO CAPS: 500.0000 mg | ORAL_CAPSULE | ORAL | Status: DC

## 2017-02-13 MED ORDER — LEVALBUTEROL HCL 0.63 MG/3ML IN NEBU
0.6300 mg | INHALATION_SOLUTION | Freq: Four times a day (QID) | RESPIRATORY_TRACT | Status: DC
  Administered 2017-02-14: 0.63 mg via RESPIRATORY_TRACT
  Filled 2017-02-13: qty 3

## 2017-02-13 MED ORDER — HYDROXYUREA 500 MG PO CAPS
1000.0000 mg | ORAL_CAPSULE | ORAL | Status: DC
  Administered 2017-02-13 – 2017-02-14 (×2): 1000 mg via ORAL
  Filled 2017-02-13 (×2): qty 2

## 2017-02-13 NOTE — Care Management Important Message (Signed)
Important Message  Patient Details  Name: Jordan [name] MRN: [id number] Date of Birth: 1940/09/07   Medicare Important Message Given:  Yes    Sherald Barge, RN 02/13/2017, 2:00 PM

## 2017-02-13 NOTE — Progress Notes (Signed)
PROGRESS NOTE                                                                                                                                                                                                             Patient Demographics:    Jordan [name], is a 76 y.o. female, DOB - 02-01-1941, EGB:[id number]  Admit date - 02/12/2017   Admitting Physician Karmen Bongo, MD  Outpatient Primary MD for the patient is Terald Sleeper, PA-C  LOS - 1  Outpatient Specialists:Dr. Annamaria Boots (pulmonary)  Chief Complaint  Patient presents with  . Shortness of Breath       Brief Narrative   76 year old female with COPD with chronic respiratory failure(On home O2 but non-adherent), nonadherent to home nebulizer, hypertension, diabetes mellitus, hyperlipidemia, depression presented with increasing cough with Greenish phlegm and shortness of breath, Since almost 3 weeks worsening for the past 1 week. Denied any fevers or chills. Patient went to see her PCP and was sent to the ED. Patient Was hypoxic In the ED, given nebs and was admitted for COPD exacerbation.    Subjective:   Reports of breathing to be slightly better since admission.Feels tremulous after receiving albuterol neb. Able to speak in full sentences.   Assessment  & Plan :    Principal Problem:   COPD With acute exacerbation ([place]) Acute on chronic respiratory failure with hypoxia (H[facility]) Nonadherent to nebs and home oxygen. No signs of infection. Continue IV Solu-Medrol, switch albuterol to Xopenex (scheduled every 6 hours along with Atrovent). Continue Azithromycin and antitussives. Add low-dose Xanax if continues to have tremors. Patient counseled on being adherent to her nebulizer And oxygen. Will need new prescription for Nebulizer and medication.  Hypothyroidism Normal TSH with mildly elevated T4.  Continue current dose of Synthroid.  Diabetes mellitus type  2 Holding Glucophage. A1C of 5.9.  Continue sliding-scale coverage.   Essential hypertension Continue Toprol       Code Status : DO NOT RESUSCITATE  Family Communication  : Daughter at bedside  Disposition Plan  : Home in 1-2 days if improved  Barriers For Discharge : Active symptoms  Consults  : None  Procedures  :   DVT Prophylaxis  :  Lovenox -   Lab Results  Component Value Date   PLT 363 02/13/2017    Antibiotics  :  Anti-infectives (From admission, onward)   Start     Dose/Rate Route Frequency Ordered Stop   02/13/17 1400  azithromycin (ZITHROMAX) 500 mg in dextrose 5 % 250 mL IVPB     500 mg 250 mL/hr over 60 Minutes Intravenous Every 24 hours 02/12/17 1755     02/12/17 1315  azithromycin (ZITHROMAX) tablet 500 mg     500 mg Oral  Once 02/12/17 1305 02/12/17 1359        Objective:   Vitals:   02/13/17 0230 02/13/17 0457 02/13/17 0751 02/13/17 1401  BP:  128/60  (!) 107/44  Pulse:  100  73  Resp:  20  16  Temp:  (!) 97.5 F (36.4 C)  98.1 F (36.7 C)  TempSrc:  Oral  Oral  SpO2: 97% 99% 99% 97%  Weight:      Height:        Wt Readings from Last 3 Encounters:  02/12/17 77.1 kg (170 lb)  02/12/17 76.2 kg (168 lb)  01/09/17 78 kg (172 lb)     Intake/Output Summary (Last 24 hours) at 02/13/2017 1429 Last data filed at 02/13/2017 1300 Gross per 24 hour  Intake 360.83 ml  Output -  Net 360.83 ml     Physical Exam  Gen: not in distress, Tremulous HEENT: no pallor, moist mucosa, supple neck Chest: Diffuse wheezing bilaterally CVS: N S1&S2, no murmurs, rubs or gallop GI: soft, NT, ND, BS+ Musculoskeletal: warm, no edema     Data Review:    CBC Recent Labs  Lab 02/12/17 1204 02/13/17 0410  WBC 6.1 7.7  HGB 13.5 12.6  HCT 40.1 39.6  PLT 372 363  MCV 109.9* 113.5*  MCH 37.0* 36.1*  MCHC 33.7 31.8  RDW 13.4 13.8  LYMPHSABS 1.3  --   MONOABS 0.6  --   EOSABS 0.4  --   BASOSABS 0.1  --     Chemistries  Recent Labs   Lab 02/12/17 1204 02/13/17 0410  NA 138 134*  K 4.0 3.9  CL 100* 99*  CO2 28 23  GLUCOSE 105* 194*  BUN 12 14  CREATININE 0.79 0.86  CALCIUM 9.1 9.0   ------------------------------------------------------------------------------------------------------------------ No results for input(s): CHOL, HDL, LDLCALC, TRIG, CHOLHDL, LDLDIRECT in the last 72 hours.  Lab Results  Component Value Date   HGBA1C 5.9 (H) 02/12/2017   ------------------------------------------------------------------------------------------------------------------ Recent Labs    02/12/17 1204  TSH 1.287   ------------------------------------------------------------------------------------------------------------------ No results for input(s): VITAMINB12, FOLATE, FERRITIN, TIBC, IRON, RETICCTPCT in the last 72 hours.  Coagulation profile No results for input(s): INR, PROTIME in the last 168 hours.  No results for input(s): DDIMER in the last 72 hours.  Cardiac Enzymes Recent Labs  Lab 02/12/17 1303  TROPONINI <0.03   ------------------------------------------------------------------------------------------------------------------ No results found for: BNP  Inpatient Medications  Scheduled Meds: . aspirin EC  81 mg Oral Daily  . docusate sodium  100 mg Oral BID  . enoxaparin (LOVENOX) injection  40 mg Subcutaneous Q24H  . feeding supplement (ENSURE ENLIVE)  237 mL Oral BID BM  . gabapentin  300 mg Oral QHS  . guaiFENesin  600 mg Oral BID  . hydroxyurea  1,000 mg Oral Once per day on Mon Tue Wed Fri Sat  . [START ON 02/15/2017] hydroxyurea  500 mg Oral Once per day on Sun Thu  . insulin aspart  0-15 Units Subcutaneous TID WC  . insulin aspart  0-5 Units Subcutaneous QHS  . ipratropium  0.5 mg Nebulization Q6H  .  levalbuterol  0.63 mg Nebulization Q6H  . levothyroxine  112 mcg Oral QAC breakfast  . methylPREDNISolone (SOLU-MEDROL) injection  80 mg Intravenous Q12H  . metoprolol succinate  25  mg Oral Daily  . pantoprazole  40 mg Oral Daily  . PARoxetine  20 mg Oral Daily   Continuous Infusions: . azithromycin     PRN Meds:.acetaminophen **OR** acetaminophen, albuterol, LORazepam, ondansetron **OR** ondansetron (ZOFRAN) IV  Micro Results No results found for this or any previous visit (from the past 240 hour(s)).  Radiology Reports Dg Chest 2 View  Result Date: 02/12/2017 CLINICAL DATA:  Productive cough, shortness of breath and chest cramping for the past week. Clinical pneumonia. Former smoker. History of COPD, diabetes, and hypertension. EXAM: CHEST  2 VIEW COMPARISON:  PA and lateral chest x-ray dated December 18, 2014 and June 08, 2014. FINDINGS: The lungs are mildly hyperinflated. The interstitial markings are coarse though stable. There is no alveolar infiltrate or pleural effusion. The heart and pulmonary vascularity are normal. There is calcification in the wall of the aortic arch. There is biapical pleural thickening which is stable. The bony thorax exhibits no acute abnormality. There is a breast implant on the right exhibiting capsular calcification. IMPRESSION: COPD.  No pneumonia nor CHF. Thoracic aortic atherosclerosis. Electronically Signed   By: David  Martinique M.D.   On: 02/12/2017 12:38    Time Spent in minutes  25   Jourdon Zimmerle M.D on 02/13/2017 at 2:29 PM  Between 7am to 7pm - Pager - [phone]  After 7pm go to [email or web address] - password [facility]  [facility] -  Office  [phone]

## 2017-02-13 NOTE — Plan of Care (Signed)
  Acute Rehab PT Goals(only PT should resolve) Patient Will Transfer Sit To/From Stand 02/13/2017 1321 - Progressing by Lonell Grandchild, PT Flowsheets Taken 02/13/2017 1321  Patient will transfer sit to/from stand with modified independence Pt Will Transfer Bed To Chair/Chair To Bed 02/13/2017 1321 - Progressing by Lonell Grandchild, PT Flowsheets Taken 02/13/2017 1321  Pt will Transfer Bed to Chair/Chair to Bed with modified independence Pt Will Ambulate 02/13/2017 1321 - Progressing by Lonell Grandchild, PT Flowsheets Taken 02/13/2017 1321  Pt will Ambulate 50 feet;with supervision;with rolling walker;with cane Pt Will Go Up/Down Stairs 02/13/2017 1321 - Progressing by Lonell Grandchild, PT Flowsheets Taken 02/13/2017 1321  Pt will Go Up / Down Stairs 3-5 stairs;with cane;with min guard assist  1:22 PM, 02/13/17 Lonell Grandchild, MPT Physical Therapist with [facility] [phone] office [phone] mobile phone

## 2017-02-13 NOTE — Care Management Note (Addendum)
Case Management Note  Patient Details  Name: Jordan [name] MRN: [id number] Date of Birth: 23-Sep-1940    Admitted with COPD exacerbation. Pt is from home, she lives with husband and daughter. She is mostly ind with ALD's at baseline. Se uses a cane but has a RW if she needs one. She has neb machine and oxygen at home pta. She says she stopped using the oxygen last year, still has concentrator and port tanks (that are emply), because she did not want to become too dependent. She will need cont oxygen at home. [facility] rep, (O2 supplier) verified pt set up for continuous oxygen supply and will have port tanks delivered at DC. Pt recommended for HH PT. Pt declines HH services at this time. Pt communicates no other needs or concerns about DC plan. Pt referred for Emmi transition calls for COPD.             Expected Discharge Date:     02/14/2017             Expected Discharge Plan:  Home/Self Care  In-House Referral:  NA  Discharge planning Services  CM Consult  Post Acute Care Choice:  NA Choice offered to:  NA  Status of Service:  Completed, signed off  Sherald Barge, RN 02/13/2017, 2:01 PM

## 2017-02-13 NOTE — Progress Notes (Signed)
Initial Nutrition Assessment  DOCUMENTATION CODES:     INTERVENTION:  CHO modified diet   Education as indicated   NUTRITION DIAGNOSIS:   Increased nutrient needs related to chronic illness(COPD on home O2) as evidenced by estimated needs, mild muscle depletion.   GOAL:   Patient will meet greater than or equal to 90% of their needs   MONITOR:   PO intake, Labs, Weight trends  REASON FOR ASSESSMENT:   Consult Assessment of nutrition requirement/status  ASSESSMENT:   76 yo female with hx of COPD. Hx also significant for DM, HTN, HLD, Stroke, GERD and Arthritis. Patient presents from home with shortness of breath. EKG obtained. Chest x-ray findings: COPD-no PNA or CHF.  Her home diet is regular- usual intake brunch and dinner daily. Likes to sleep until late morning. Her daughter and or husband prepare her foods. She has an [employer] and expressed concern for potassium content given her hx of hyperkalemia. Relayed to pt that her potassium is currently within normal range.  Body weight shows a gain of 6 lb since February 2018. The patient says she started gaining weight after she stopped dancing. She is a former Medical laboratory scientific officer.   Recent Labs  Lab 02/12/17 1204 02/13/17 0410  NA 138 134*  K 4.0 3.9  CL 100* 99*  CO2 28 23  BUN 12 14  CREATININE 0.79 0.86  CALCIUM 9.1 9.0  GLUCOSE 105* 194*    Labs and meds reviewed. A1C- 5.9% on 02/12/17. MCV elevated    CBG (last 3)  Recent Labs    02/12/17 2219 02/13/17 0814 02/13/17 1121  GLUCAP 233* 146* 262*     NUTRITION - FOCUSED PHYSICAL EXAM:    Most Recent Value  Upper Arm Region  Mild depletion  Temple Region  Mild depletion  Clavicle Bone Region  No depletion  Clavicle and Acromion Bone Region  Mild depletion  Scapular Bone Region  No depletion  Dorsal Hand  Mild depletion  Edema (RD Assessment)  None  Hair  Reviewed  Eyes  Reviewed  Mouth  Reviewed  Skin  Reviewed  Nails  Reviewed      Diet  Order:  Diet Carb Modified Fluid consistency: Thin; Room service appropriate? Yes  EDUCATION NEEDS:   Education needs have been addressed   Skin:  Skin Assessment: Reviewed RN Assessment  Last BM:  unknown  Height:   Ht Readings from Last 1 Encounters:  02/12/17 5\' 6"  (1.676 m)    Weight:   Wt Readings from Last 1 Encounters:  02/12/17 170 lb (77.1 kg)    Ideal Body Weight:  59 kg  BMI:  Body mass index is 27.44 kg/m.  Estimated Nutritional Needs:   Kcal:  1800-1900  Protein:  90-95 gr  Fluid:  1.8-1.9 liters daily  Colman Cater MS,RD,CSG,LDN Office: [phone] Pager: [phone]

## 2017-02-13 NOTE — Evaluation (Signed)
Physical Therapy Evaluation Patient Details Name: Jordan [name] MRN: [id number] DOB: [birth year] Today's Date: 02/13/2017   History of Present Illness  Jordan [name] is a 76 y.o. female with medical history significant of COPD (prn home O2, not using); hypothyroidism; HTN; HLD; DM; and Depression presenting with cough and SOB.  Patient reports that she "started coughing terribly, just unbelievable amounts of junk coming down through my sinuses, into my throat, choking me.  It's been terrible.  The one thing that gave me relief is a Allegra-D.  It really, truly helped me but it also made me very nervous, very very nervous, made my heart beat really fast.  So I couldn't take it more than every 3 days or so."  Symptoms have been going on for 3 weeks, but worse over the last week.  Cough is productive of very thick, slimy golden- to green- to white sputum.  She does not think she has had fevers.  Her daughter insisted that she come to the doctor; the PCP sent her to the ER.  She has been terribly SOB x 3-4 weeks.    She was previously on oxygen and breathing treatments but she was able to wean herself off those things.    Clinical Impression  Patient near baseline for functional mobility and gait, slightly limited secondary to generalized weakness and fatigue during activity.  Patient on 2 LPM throughout treatment, desaturates to 86% with exertion, increased to 93% at rest.  Patient will benefit from continued physical therapy in hospital and recommended venue below to increase strength, balance, endurance for safe ADLs and gait.    Follow Up Recommendations Home health PT;Supervision for mobility/OOB    Equipment Recommendations  None recommended by PT    Recommendations for Other Services       Precautions / Restrictions Precautions Precautions: Fall Restrictions Weight Bearing Restrictions: No      Mobility  Bed Mobility Overal bed mobility: Modified Independent                 Transfers Overall transfer level: Needs assistance   Transfers: Sit to/from Stand;Stand Pivot Transfers Sit to Stand: Supervision Stand pivot transfers: Supervision          Ambulation/Gait Ambulation/Gait assistance: Min guard Ambulation Distance (Feet): 30 Feet Assistive device: Straight cane;Rolling walker (2 wheeled) Gait Pattern/deviations: Step-to pattern;Decreased step length - right;Decreased step length - left;Decreased stride length   Gait velocity interpretation: Below normal speed for age/gender General Gait Details: Patient demonstrates slow unsteady cadene with frequent swaying using SPC, preferred use of RW today due to trembling in extremities, balance improved using RW, but limited secondary to c/o fatigue, SOB  Stairs            Wheelchair Mobility    Modified Rankin (Stroke Patients Only)       Balance Overall balance assessment: Needs assistance Sitting-balance support: No upper extremity supported;Feet supported Sitting balance-Leahy Scale: Good     Standing balance support: During functional activity;Bilateral upper extremity supported Standing balance-Leahy Scale: Fair Standing balance comment: fair/poor using SPC                             Pertinent Vitals/Pain Pain Assessment: 0-10 Pain Score: 5  Pain Location: headache mostly on left side and top front of head Pain Descriptors / Indicators: Aching Pain Intervention(s): Limited activity within patient's tolerance;Monitored during session    Home Living Family/patient expects to be discharged to:: Private residence  Living Arrangements: Spouse/significant other;Children Available Help at Discharge: Family Type of Home: House Home Access: Stairs to enter Entrance Stairs-Rails: None Entrance Stairs-Number of Steps: 2 Home Layout: One level Home Equipment: Cane - single point;Walker - 2 wheels;Bedside commode;Tub bench;Grab bars - tub/shower      Prior Function Level  of Independence: Independent with assistive device(s)         Comments: Household gait with SPC     Hand Dominance        Extremity/Trunk Assessment   Upper Extremity Assessment Upper Extremity Assessment: Generalized weakness    Lower Extremity Assessment Lower Extremity Assessment: Generalized weakness    Cervical / Trunk Assessment Cervical / Trunk Assessment: Normal  Communication   Communication: No difficulties  Cognition Arousal/Alertness: Awake/alert Behavior During Therapy: WFL for tasks assessed/performed Overall Cognitive Status: Within Functional Limits for tasks assessed                                        General Comments      Exercises     Assessment/Plan    PT Assessment Patient needs continued PT services  PT Problem List Decreased strength;Decreased activity tolerance;Decreased balance;Decreased mobility       PT Treatment Interventions Gait training;Stair training;Functional mobility training;Therapeutic activities;Therapeutic exercise;Patient/family education    PT Goals (Current goals can be found in the Care Plan section)  Acute Rehab PT Goals Patient Stated Goal: return home with family to assist PT Goal Formulation: With patient Time For Goal Achievement: 02/16/17 Potential to Achieve Goals: Good    Frequency Min 3X/week   Barriers to discharge        Co-evaluation               AM-PAC PT "6 Clicks" Daily Activity  Outcome Measure Difficulty turning over in bed (including adjusting bedclothes, sheets and blankets)?: None Difficulty moving from lying on back to sitting on the side of the bed? : None Difficulty sitting down on and standing up from a chair with arms (e.g., wheelchair, bedside commode, etc,.)?: A Little Help needed moving to and from a bed to chair (including a wheelchair)?: A Little Help needed walking in hospital room?: A Little Help needed climbing 3-5 steps with a railing? : A  Little 6 Click Score: 20    End of Session   Activity Tolerance: Patient tolerated treatment well;Patient limited by fatigue Patient left: in chair;with call bell/phone within reach Nurse Communication: Mobility status PT Visit Diagnosis: Unsteadiness on feet (R26.81);Other abnormalities of gait and mobility (R26.89);Muscle weakness (generalized) (M62.81)    Time: 5400-8676 PT Time Calculation (min) (ACUTE ONLY): 22 min   Charges:   PT Evaluation $PT Eval Moderate Complexity: 1 Mod PT Treatments $Therapeutic Activity: 8-22 mins   PT G Codes:   PT G-Codes **NOT FOR INPATIENT CLASS** Functional Assessment Tool Used: AM-PAC 6 Clicks Basic Mobility Functional Limitation: Mobility: Walking and moving around Mobility: Walking and Moving Around Current Status (P9509): At least 20 percent but less than 40 percent impaired, limited or restricted Mobility: Walking and Moving Around Goal Status [phone]): At least 20 percent but less than 40 percent impaired, limited or restricted Mobility: Walking and Moving Around Discharge Status [phone]): At least 20 percent but less than 40 percent impaired, limited or restricted    1:19 PM, 02/13/17 Lonell Grandchild, MPT Physical Therapist with [facility] 336 [phone] office [phone] mobile phone

## 2017-02-13 NOTE — Plan of Care (Signed)
Pt is progressing 

## 2017-02-13 NOTE — Clinical Social Work Note (Signed)
CSW consulted for COPD protocol. Patient does not meet COPD Gold protocol (three admission within six month time period).    LCSW signing off.      Lottie Siska, Clydene Pugh, LCSW

## 2017-02-14 DIAGNOSIS — F419 Anxiety disorder, unspecified: Secondary | ICD-10-CM

## 2017-02-14 DIAGNOSIS — J9621 Acute and chronic respiratory failure with hypoxia: Secondary | ICD-10-CM | POA: Diagnosis present

## 2017-02-14 LAB — GLUCOSE, CAPILLARY
GLUCOSE-CAPILLARY: 141 mg/dL — AB (ref 65–99)
Glucose-Capillary: 142 mg/dL — ABNORMAL HIGH (ref 65–99)

## 2017-02-14 MED ORDER — PREDNISONE 20 MG PO TABS: 20.0000 mg | ORAL_TABLET | Freq: Every day | ORAL | 0 refills | Status: AC

## 2017-02-14 MED ORDER — IPRATROPIUM BROMIDE 0.02 % IN SOLN: 0.5000 mg | Freq: Four times a day (QID) | RESPIRATORY_TRACT | 3 refills | Status: DC | PRN

## 2017-02-14 MED ORDER — ENSURE ENLIVE PO LIQD: 237.0000 mL | Freq: Two times a day (BID) | ORAL | 12 refills | Status: DC

## 2017-02-14 MED ORDER — GUAIFENESIN ER 600 MG PO TB12: 600.0000 mg | ORAL_TABLET | Freq: Two times a day (BID) | ORAL | 0 refills | Status: DC

## 2017-02-14 MED ORDER — AZITHROMYCIN 500 MG PO TABS: 500.0000 mg | ORAL_TABLET | Freq: Every day | ORAL | 0 refills | Status: AC

## 2017-02-14 MED ORDER — ALBUTEROL SULFATE (2.5 MG/3ML) 0.083% IN NEBU: 2.5000 mg | INHALATION_SOLUTION | Freq: Four times a day (QID) | RESPIRATORY_TRACT | 3 refills | Status: DC | PRN

## 2017-02-14 MED ORDER — ALPRAZOLAM 0.5 MG PO TABS: 0.5000 mg | ORAL_TABLET | Freq: Three times a day (TID) | ORAL | 0 refills | Status: DC | PRN

## 2017-02-14 MED ORDER — TIOTROPIUM BROMIDE MONOHYDRATE 18 MCG IN CAPS: 18.0000 ug | ORAL_CAPSULE | Freq: Every day | RESPIRATORY_TRACT | 2 refills | Status: DC

## 2017-02-14 NOTE — Discharge Instructions (Signed)

## 2017-02-14 NOTE — Discharge Summary (Addendum)
Physician Discharge Summary  Jordan [name] PJA:[id number] DOB: [birth year] DOA: 02/12/2017  PCP: Terald Sleeper, PA-C  Admit date: 02/12/2017 Discharge date: 02/14/2017  Admitted From: Home Disposition:  Home  Recommendations for Outpatient Follow-up:  1. Follow up with PCP in 1-2 weeks. Patient will complete 5 day course of antibiotics on 12/11 and Will be discharged on 12 days of oral prednisone taper. 2. Follow-up with her pulmonologist Dr. Annamaria Boots in 4 weeks.  Home Health:None Equipment/Devices:none  Discharge Condition:Fair CODE STATUS:DO NOT RESUSCITATE Diet recommendation: Carb Modified     Discharge Diagnoses:  Principal Problem:   Acute on chronic respiratory failure with hypoxia ([place])  Active Problems:    COPD exacerbation (HCC)   Anxiety   Diabetes mellitus without complication (HCC)   Hypertension   Hypothyroidism   Thrombocytosis (HCC)   Brief narrative/history of present illness Please refer to admission H&P for details, in brief,  76 year old female with COPD with chronic respiratory failure(On home O2 but non-adherent), nonadherent to home nebulizer, hypertension, diabetes mellitus, hyperlipidemia, depression presented with increasing cough with Greenish phlegm and shortness of breath, Since almost 3 weeks worsening for the past 1 week. Denied any fevers or chills. Patient went to see her PCP and was sent to the ED. Patient Was hypoxic In the ED, given nebs and was admitted for COPD exacerbation.  Hospital course  Principal Problem:   COPD With acute exacerbation ([place]) Acute on chronic respiratory failure with hypoxia (HCC) Nonadherent to nebs and home oxygen. No signs of infection. Improved with IV Solu-Medrol. Albuterol neb was switched to Xopenex due to tremors. Continued on scheduled Atrovent nebs. Supportive care with continuous oxygen, antitussives and empiric azithromycin. Patient counseled on being adherent to her nebulizer and oxygen.  I have written  her a new prescription for albuterol and Atrovent nebs along with Spiriva inhaler.  Patient clinically improved and will be discharged on oral prednisone taper over the next 12 days along with 5 day course of azithromycin. Instructed to follow-up with her PCP and pulmonologist as outpatient. Also counseled on Adherence to using her inhaler, as needed nebs .  Evaluated for oxygen use. Patient maintaining O2 sat >92% On room air both at rest and on ambulation. Patient does not need home oxygen.  Hypothyroidism Normal TSH with mildly elevated T4.  Continue current dose of Synthroid.  Diabetes mellitus type 2 A1c of 5.9. Continue Glucophage.  Essential hypertension Continue Toprol  anxiety Patient tremulous ,Likely due to underlying Anxiety and albuterol nebulizer. Will prescribe short course of Xanax as needed.    Family Communication  : Discussed with daughter at bedside on 12/7  Disposition Plan  : Home  Consults  : None  Procedures  : None     Discharge Instructions   Allergies as of 02/14/2017      Reactions   Bee Venom Anaphylaxis   Codeine Itching   Lipitor [atorvastatin]    Extreme Dry Mouth, joint pain   Penicillins Hives   Has patient had a PCN reaction causing immediate rash, facial/tongue/throat swelling, SOB or lightheadedness with hypotension: No Has patient had a PCN reaction causing severe rash involving mucus membranes or skin necrosis: Yes Has patient had a PCN reaction that required hospitalization: No Has patient had a PCN reaction occurring within the last 10 years: No If all of the above answers are "NO", then may proceed with Cephalosporin use.   Demeclocycline Rash   Morphine And Related Rash   Tetracyclines & Related Rash  Medication List    TAKE these medications   albuterol 108 (90 Base) MCG/ACT inhaler Commonly known as:  VENTOLIN HFA Inhale 2 puffs into the lungs every 4 (four) hours as needed for wheezing or shortness of  breath. What changed:  Another medication with the same name was added. Make sure you understand how and when to take each.   albuterol (2.5 MG/3ML) 0.083% nebulizer solution Commonly known as:  PROVENTIL Take 3 mLs (2.5 mg total) by nebulization every 6 (six) hours as needed for wheezing or shortness of breath. What changed:  You were already taking a medication with the same name, and this prescription was added. Make sure you understand how and when to take each.   ALPRAZolam 0.5 MG tablet Commonly known as:  XANAX Take 1 tablet (0.5 mg total) by mouth 3 (three) times daily as needed for sleep or anxiety.   aspirin EC 81 MG tablet Take 81 mg by mouth daily.   azithromycin 500 MG tablet Commonly known as:  ZITHROMAX Take 1 tablet (500 mg total) by mouth daily for 3 days. Start taking on:  02/15/2017   feeding supplement (ENSURE ENLIVE) Liqd Take 237 mLs by mouth 2 (two) times daily between meals.   fexofenadine-pseudoephedrine 180-240 MG 24 hr tablet Commonly known as:  ALLEGRA-D 24 Take 1 tablet by mouth daily as needed (allergies).   gabapentin 300 MG capsule Commonly known as:  NEURONTIN TAKE 1 CAPSULE (300 MG TOTAL) BY MOUTH AT BEDTIME.   guaiFENesin 600 MG 12 hr tablet Commonly known as:  MUCINEX Take 1 tablet (600 mg total) by mouth 2 (two) times daily.   hydroxyurea 500 MG capsule Commonly known as:  HYDREA TAKE 2 CAPSULES ON MON-TUES-WED-FRI-SAT. Take 1 po on thurs and sunday   ICAPS AREDS 2 PO Take 1 capsule by mouth 2 (two) times daily.   ipratropium 0.02 % nebulizer solution Commonly known as:  ATROVENT Take 2.5 mLs (0.5 mg total) by nebulization every 6 (six) hours as needed for wheezing or shortness of breath.   levothyroxine 112 MCG tablet Commonly known as:  SYNTHROID, LEVOTHROID TAKE 1 TABLET BY MOUTH EVERY DAY   metFORMIN 500 MG tablet Commonly known as:  GLUCOPHAGE TAKE 1 TABLET (500 MG TOTAL) BY MOUTH 2 (TWO) TIMES DAILY WITH A MEAL.    metoprolol succinate 25 MG 24 hr tablet Commonly known as:  TOPROL-XL TAKE 1 TABLET BY MOUTH EVERY DAY   omeprazole 20 MG capsule Commonly known as:  PRILOSEC TAKE ONE CAPSULE BY MOUTH EVERY MORNING 30 MINUTES PRIOR TO YOUR FIRST MEAL   PARoxetine 20 MG tablet Commonly known as:  PAXIL TAKE 1 TABLET BY MOUTH EVERY DAY   predniSONE 20 MG tablet Commonly known as:  DELTASONE Take 1 tablet (20 mg total) by mouth daily with breakfast for 12 days.   promethazine 12.5 MG tablet Commonly known as:  PHENERGAN Take 1 tablet (12.5 mg total) by mouth every 8 (eight) hours as needed for nausea or vomiting.   tiotropium 18 MCG inhalation capsule Commonly known as:  SPIRIVA HANDIHALER Place 1 capsule (18 mcg total) into inhaler and inhale daily.      Follow-up Information    Terald Sleeper, PA-C. Schedule an appointment as soon as possible for a visit in 1 week(s).   Specialties:  Physician Assistant, Family Medicine Contact information: [place] [address] [phone]        Deneise Lever, MD. Schedule an appointment as soon as  possible for a visit in 4 week(s).   Specialty:  Pulmonary Disease Contact information: [place] [address] [phone]          Allergies  Allergen Reactions  . Bee Venom Anaphylaxis  . Codeine Itching  . Lipitor [Atorvastatin]     Extreme Dry Mouth, joint pain  . Penicillins Hives    Has patient had a PCN reaction causing immediate rash, facial/tongue/throat swelling, SOB or lightheadedness with hypotension: No Has patient had a PCN reaction causing severe rash involving mucus membranes or skin necrosis: Yes Has patient had a PCN reaction that required hospitalization: No Has patient had a PCN reaction occurring within the last 10 years: No If all of the above answers are "NO", then may proceed with Cephalosporin use.   . Demeclocycline Rash  . Morphine And Related Rash  . Tetracyclines & Related Rash      Procedures/Studies: Dg Chest 2 View  Result Date: 02/12/2017 CLINICAL DATA:  Productive cough, shortness of breath and chest cramping for the past week. Clinical pneumonia. Former smoker. History of COPD, diabetes, and hypertension. EXAM: CHEST  2 VIEW COMPARISON:  PA and lateral chest x-ray dated December 18, 2014 and June 08, 2014. FINDINGS: The lungs are mildly hyperinflated. The interstitial markings are coarse though stable. There is no alveolar infiltrate or pleural effusion. The heart and pulmonary vascularity are normal. There is calcification in the wall of the aortic arch. There is biapical pleural thickening which is stable. The bony thorax exhibits no acute abnormality. There is a breast implant on the right exhibiting capsular calcification. IMPRESSION: COPD.  No pneumonia nor CHF. Thoracic aortic atherosclerosis. Electronically Signed   By: David  Martinique M.D.   On: 02/12/2017 12:38       Subjective: Still has some tremors. Breathing much better today and no further wheezing.  Discharge Exam: Vitals:   02/14/17 0758 02/14/17 1237  BP:    Pulse:    Resp:    Temp:    SpO2: 93% 99%   Vitals:   02/13/17 2200 02/14/17 0612 02/14/17 0758 02/14/17 1237  BP: (!) 122/58 120/82    Pulse: 92 71    Resp: 20     Temp: 98.3 F (36.8 C) 98.2 F (36.8 C)    TempSrc: Oral Oral    SpO2: 98% 95% 93% 99%  Weight:      Height:        Gen: not in distress, Mild tremors. Speaking in full sentences HEENT: no pallor, moist mucosa, supple neck Chest: Improved breath sounds bilaterally, few scattered rhonchi CVS: N S1&S2, no murmurs, rubs or gallop GI: soft, NT, ND, BS+ Musculoskeletal: warm, no edema      The results of significant diagnostics from this hospitalization (including imaging, microbiology, ancillary and laboratory) are listed below for reference.     Microbiology: No results found for this or any previous visit (from the past 240 hour(s)).   Labs: BNP  (last 3 results) No results for input(s): BNP in the last 8760 hours. Basic Metabolic Panel: Recent Labs  Lab 02/12/17 1204 02/13/17 0410  NA 138 134*  K 4.0 3.9  CL 100* 99*  CO2 28 23  GLUCOSE 105* 194*  BUN 12 14  CREATININE 0.79 0.86  CALCIUM 9.1 9.0   Liver Function Tests: No results for input(s): AST, ALT, ALKPHOS, BILITOT, PROT, ALBUMIN in the last 168 hours. No results for input(s): LIPASE, AMYLASE in the last 168 hours. No results for input(s):  AMMONIA in the last 168 hours. CBC: Recent Labs  Lab 02/12/17 1204 02/13/17 0410  WBC 6.1 7.7  NEUTROABS 3.8  --   HGB 13.5 12.6  HCT 40.1 39.6  MCV 109.9* 113.5*  PLT 372 363   Cardiac Enzymes: Recent Labs  Lab 02/12/17 1303  TROPONINI <0.03   BNP: Invalid input(s): POCBNP CBG: Recent Labs  Lab 02/13/17 1121 02/13/17 1747 02/13/17 2143 02/14/17 0752 02/14/17 1149  GLUCAP 262* 148* 167* 142* 141*   D-Dimer No results for input(s): DDIMER in the last 72 hours. Hgb A1c Recent Labs    02/12/17 1204  HGBA1C 5.9*   Lipid Profile No results for input(s): CHOL, HDL, LDLCALC, TRIG, CHOLHDL, LDLDIRECT in the last 72 hours. Thyroid function studies Recent Labs    02/12/17 1204  TSH 1.287   Anemia work up No results for input(s): VITAMINB12, FOLATE, FERRITIN, TIBC, IRON, RETICCTPCT in the last 72 hours. Urinalysis    Component Value Date/Time   COLORURINE YELLOW 12/18/2014 1025   APPEARANCEUR Clear 09/11/2016 1707   LABSPEC 1.014 12/18/2014 1025   PHURINE 7.0 12/18/2014 1025   GLUCOSEU Negative 09/11/2016 1707   HGBUR NEGATIVE 12/18/2014 1025   BILIRUBINUR Negative 09/11/2016 1707   KETONESUR NEGATIVE 12/18/2014 1025   PROTEINUR Negative 09/11/2016 1707   PROTEINUR NEGATIVE 12/18/2014 1025   UROBILINOGEN 0.2 12/18/2014 1025   NITRITE Negative 09/11/2016 1707   NITRITE NEGATIVE 12/18/2014 1025   LEUKOCYTESUR Trace (A) 09/11/2016 1707   Sepsis Labs Invalid input(s): PROCALCITONIN,  WBC,   LACTICIDVEN Microbiology No results found for this or any previous visit (from the past 240 hour(s)).   Time coordinating discharge: Over 30 minutes  SIGNED:   Louellen Molder, MD  [facility] 02/14/2017, 12:40 PM Pager   If 7PM-7AM, please contact night-coverage [email or web address] Password [email or web address]

## 2017-02-14 NOTE — Plan of Care (Signed)
Pt is progressing 

## 2017-02-14 NOTE — Progress Notes (Signed)
Pt O2 stats: 2L O2 100% RA 96% sitting Ambulating on RA 92%

## 2017-02-14 NOTE — Progress Notes (Signed)
Pt removed IV-clean, dry, and intact. Reviewed d/c paperwork with pt and husband. Reviewed home care and home meds and 6 prescriptions. Wheeled stable  pt to main entrance with belongings and a full tank of oxygen.

## 2017-02-16 ENCOUNTER — Ambulatory Visit: Payer: Medicare Other | Admitting: Internal Medicine

## 2017-02-19 ENCOUNTER — Ambulatory Visit (INDEPENDENT_AMBULATORY_CARE_PROVIDER_SITE_OTHER): Payer: Medicare Other | Admitting: Family Medicine

## 2017-02-19 ENCOUNTER — Encounter: Payer: Self-pay | Admitting: Family Medicine

## 2017-02-19 VITALS — BP 130/68 | HR 58 | Temp 97.5°F | Ht 66.0 in | Wt 177.0 lb

## 2017-02-19 DIAGNOSIS — R110 Nausea: Secondary | ICD-10-CM

## 2017-02-19 DIAGNOSIS — F419 Anxiety disorder, unspecified: Secondary | ICD-10-CM | POA: Diagnosis not present

## 2017-02-19 DIAGNOSIS — Z09 Encounter for follow-up examination after completed treatment for conditions other than malignant neoplasm: Secondary | ICD-10-CM | POA: Diagnosis not present

## 2017-02-19 DIAGNOSIS — I700 Atherosclerosis of aorta: Secondary | ICD-10-CM | POA: Insufficient documentation

## 2017-02-19 DIAGNOSIS — J441 Chronic obstructive pulmonary disease with (acute) exacerbation: Secondary | ICD-10-CM | POA: Diagnosis not present

## 2017-02-19 DIAGNOSIS — R0902 Hypoxemia: Secondary | ICD-10-CM

## 2017-02-19 MED ORDER — LORAZEPAM 0.5 MG PO TABS: 0.5000 mg | ORAL_TABLET | Freq: Two times a day (BID) | ORAL | 0 refills | Status: DC | PRN

## 2017-02-19 MED ORDER — FLUTICASONE FUROATE-VILANTEROL 100-25 MCG/INH IN AEPB: 1.0000 | INHALATION_SPRAY | Freq: Every day | RESPIRATORY_TRACT | 0 refills | Status: DC

## 2017-02-19 MED ORDER — LORAZEPAM 0.5 MG PO TABS: 0.5000 mg | ORAL_TABLET | Freq: Every evening | ORAL | 0 refills | Status: DC | PRN

## 2017-02-19 MED ORDER — PROMETHAZINE HCL 12.5 MG PO TABS: 12.5000 mg | ORAL_TABLET | Freq: Three times a day (TID) | ORAL | 0 refills | Status: DC | PRN

## 2017-02-19 NOTE — Patient Instructions (Addendum)
Make sure that you continue your prednisone as prescribed.  Call Dr. Annamaria Boots to make sure that you have a follow-up appointment in the first week of January.  I have prescribed you Breo inhaler to use until you see Dr. Annamaria Boots.  You should have a 1 month supply. You will inhale 1 puff 1 time a day.  As we discussed, your chest x-ray that was obtained in the hospital showed plaque buildup in the main artery your chest.  This finding suggests that you are at increased risk for stroke and heart attack.  Make sure that you are keeping a good eye on your blood pressure.  Maintain a healthy diet that is low in fat, salt and sugars.  Atherosclerosis Atherosclerosis is narrowing and hardening of the blood vessels (arteries). Arteries are tubes that carry blood that contains oxygen from the heart to all parts of the body. Arteries can become narrow or clogged with a buildup of fat, cholesterol, calcium, or other substances (plaque). Plaque decreases the amount of blood that can flow through the artery. Atherosclerosis can affect any artery in the body, including:  Heart arteries (coronary artery disease), which may cause heart attack.  Brain arteries, which may cause stroke.  Leg, arm, and pelvis arteries (peripheral artery disease), which may cause pain and numbness.  Kidney arteries, which may cause kidney (renal) failure.  Treatment may slow the disease and prevent further damage to the heart, brain, peripheral arteries, and kidneys. What are the causes? Atherosclerosis develops when plaque forms in an artery. This damages the inside wall of the artery. Over time, the plaque grows and hardens. It may break through the artery wall. This causes a blood clot to form over the break, which narrows the artery more. The clot may also break loose and travel to other arteries, causing more damage. What increases the risk? This condition is more likely to develop in people who:  Are middle age or older.  Have a  family history of atherosclerosis.  Have high cholesterol.  Have high blood fats (triglycerides).  Have diabetes.  Are overweight.  Smoke tobacco.  Do not exercise enough.  Have a substance in the blood that indicates increased levels of inflammation in the body (C-reactive protein, or CRP).  Have sleep apnea.  Are stressed.  Drink too much alcohol.  What are the signs or symptoms? This condition may not cause any symptoms. If you do have symptoms, they are caused by damage to an area of your body that is not getting enough blood. The following symptoms are possible:  Coronary artery disease may cause chest pain and shortness of breath.  Decreased blood supply to your brain may cause a stroke. Signs and symptoms of stroke may include sudden: ? Weakness on one side of the body. ? Confusion. ? Changes in vision. ? Inability to speak or understand speech. ? Loss of balance, coordination, or ability to walk. ? Severe headache. ? Loss of consciousness.  Peripheral artery disease may cause pain and numbness, often in the legs and hips.  Renal failure may cause fatigue, nausea, swelling, and itchy skin.  How is this diagnosed? This condition is diagnosed based on your medical history and a physical exam. During the exam, your health care provider will check your pulses and listen for a "whooshing" sound over your arteries (bruit). You may have tests, such as:  Blood tests to check your levels of cholesterol, triglycerides, and CRP.  Electrocardiogram (ECG) to check for heart damage.  Chest  X-ray to see if your heart is enlarged, which is a sign of heart failure.  Stress test to see how your heart reacts to exercise.  Echocardiogram to get images of the inside of your heart.  Ankle-Brachial index to compare blood pressure in your arms to blood pressure in your ankles.  Ultrasound of your peripheral arteries to check blood flow.  CT scan to check for damage to your  heart or brain.  X-rays of blood vessels after dye has been injected (angiogram) to check blood flow.  How is this treated? Treatment starts with lifestyle changes, which may include:  Changing your diet.  Losing weight.  Reducing stress.  Exercising and being more physically active.  Not smoking.  You also may need medicine to:  Lower triglycerides and cholesterol.  Lower and control blood pressure.  Prevent blood clots.  Lower inflammation in your body.  Lower and control your blood sugar.  Sometimes, surgery is needed to remove plaque, widen your artery, or create a new path for your blood (bypass). Surgical treatment may include:  Removing plaque from an artery (endarterectomy).  Opening a narrowed heart artery (angioplasty).  Heart or peripheral artery bypass graft surgery.  Follow these instructions at home: Lifestyle   Eat a heart-healthy diet. Talk to your health care provider or a diet specialist (dietitian) if you need help. A heart-healthy diet includes: ? Limiting unhealthy fats and increasing healthy fats. Some examples of healthy fats are olive oil and canola oil. ? Eating plant-based foods, such as fruits, vegetables, nuts, legumes, and whole grains.  Follow an exercise program as told by your health care provider.  Maintain a healthy weight. Lose weight if directed by your health care provider.  Rest when you are tired.  Learn to manage your stress.  Do not use any tobacco products, such as cigarettes, chewing tobacco, and e-cigarettes. If you need help quitting, ask your health care provider.  Limit alcohol intake to no more than 1 drink a day for nonpregnant women and 2 drinks a day for men. One drink equals 12 oz of beer, 5 oz of wine, or 1 oz of hard liquor.  Do not abuse drugs. General instructions  Take over-the-counter and prescription medicines only as told by your health care provider.  Manage other health conditions as told by  your health care provider.  Keep all follow-up visits as told by your health care provider. This is important. Contact a health care provider if:  You have chest pain or discomfort. This includes squeezing chest pain that may feel like indigestion (angina).  You have shortness of breath.  You have an irregular heartbeat.  You have unexplained fatigue.  You have unexplained pain or numbness in an arm, leg, or hip.  You have nausea, swelling of your hands or feet, and itchy skin. Get help right away if:  You have symptoms of a heart attack, such as: ? Chest pain. ? Shortness of breath. ? Pain in your neck, jaw, arms, back, or stomach. ? Cold sweat. ? Nausea. ? Light-headedness.  You have symptoms of a stroke, such as sudden: ? Weakness on one side of your body. ? Confusion. ? Changes in vision. ? Inability to speak or understand speech. ? Loss of balance, coordination, or ability to walk. ? Severe headache. ? Loss of consciousness. These symptoms may represent a serious problem that is an emergency. Do not wait to see if the symptoms will go away. Get medical help right away. Call  your local emergency services (911 in the [place] Do not drive yourself to the hospital. This information is not intended to replace advice given to you by your health care provider. Make sure you discuss any questions you have with your health care provider. Document Released: 05/17/2003 Document Revised: 08/02/2015 Document Reviewed: 01/15/2015 Elsevier Interactive Patient Education  2018 [employer]  Chronic Obstructive Pulmonary Disease Exacerbation Chronic obstructive pulmonary disease (COPD) is a common lung problem. In COPD, the flow of air from the lungs is limited. COPD exacerbations are times that breathing gets worse and you need extra treatment. Without treatment they can be life threatening. If they happen often, your lungs can become more damaged. If your COPD gets worse, your doctor  may treat you with:  Medicines.  Oxygen.  Different ways to clear your airway, such as using a mask.  Follow these instructions at home:  Do not smoke.  Avoid tobacco smoke and other things that bother your lungs.  If given, take your antibiotic medicine as told. Finish the medicine even if you start to feel better.  Only take medicines as told by your doctor.  Drink enough fluids to keep your pee (urine) clear or pale yellow (unless your doctor has told you not to).  Use a cool mist machine (vaporizer).  If you use oxygen or a machine that turns liquid medicine into a mist (nebulizer), continue to use them as told.  Keep up with shots (vaccinations) as told by your doctor.  Exercise regularly.  Eat healthy foods.  Keep all doctor visits as told. Get help right away if:  You are very short of breath and it gets worse.  You have trouble talking.  You have bad chest pain.  You have blood in your spit (sputum).  You have a fever.  You keep throwing up (vomiting).  You feel weak, or you pass out (faint).  You feel confused.  You keep getting worse. This information is not intended to replace advice given to you by your health care provider. Make sure you discuss any questions you have with your health care provider. Document Released: 02/13/2011 Document Revised: 08/02/2015 Document Reviewed: 10/29/2012 Elsevier Interactive Patient Education  2017 [employer].

## 2017-02-19 NOTE — Progress Notes (Addendum)
Subjective: CC: hospital follow up PCP: Terald Sleeper, PA-C HPI:Jordan [name] is a 76 y.o. female presenting to clinic today for:  1. Hospital follow up Patient was discharged from [facility] on 02/14/2017 after being hospitalized x2 days for acute on chronic respiratory failure in the setting of COPD exacerbation.  Per the note, it appears that she had been non-compliant with home 2L of oxygen and home COPD medications.  Upon discharge, she was determined not to need ambulatory oxygen, as she was maintaining her oxygen saturation above 92%.  She was discharged on azithromycin x5 days and a 12-day prednisone taper.  Labs at discharge were fairly unremarkable except for a mildly low sodium sodium 134.  CBC unremarkable.  Chest x-ray at discharge revealed findings consistent with COPD and incidentally thoracic aortic atherosclerosis.  It was recommended that she follow-up with her pulmonologist, Dr. Annamaria Boots 4 weeks following hospitalization.  Patient presents to office for hospital follow up and reports that her breathing is much improved compared to when she was admitted.  She does note some fatigue.  No fevers, chills.  No hemoptysis.  She does note nausea without vomiting following breathing treatments.  Last night she took a promethazine 12.5 mg tablet which relieved symptoms.  She reports compliance with home oxygen and albuterol treatments.  She is using both the nebulizer and the HFA roughly every 4 hours.  She notes that she was unable to afford Spiriva, citing that it was over $300, and thus has not picked it up.  She reports compliance with the antibiotic and has completed this.  She is still on the prednisone taper and has about a week left of this medication.  Additionally, she reports that she had an appointment with Dr. Annamaria Boots on Monday but this was canceled due to snow.  Their office is still closed and she has not been able to reschedule this appointment but they are working on getting  her seen within the next couple of weeks.  She reports that she needs a prescription for oxygen as she is almost out of the canister that was provided in the hospital.  Additionally, her daughter notes that patient is interested in switching over from Xanax to Ativan.  She was given Ativan in the hospital and she tolerated this better than the Xanax.  She notes that she does not use the Xanax more than once a day.  She uses this for sleeping at nighttime.  Denies excessive daytime sleepiness or impairment.  She has been on the medication for some time.  She also takes Paxil 20 mg daily.  She reports that symptoms of anxiety and depression are fairly well controlled with this regimen.   ROS: Per HPI  Allergies  Allergen Reactions  . Bee Venom Anaphylaxis  . Codeine Itching  . Lipitor [Atorvastatin]     Extreme Dry Mouth, joint pain  . Penicillins Hives    Has patient had a PCN reaction causing immediate rash, facial/tongue/throat swelling, SOB or lightheadedness with hypotension: No Has patient had a PCN reaction causing severe rash involving mucus membranes or skin necrosis: Yes Has patient had a PCN reaction that required hospitalization: No Has patient had a PCN reaction occurring within the last 10 years: No If all of the above answers are "NO", then may proceed with Cephalosporin use.   . Demeclocycline Rash  . Morphine And Related Rash  . Tetracyclines & Related Rash   Past Medical History:  Diagnosis Date  . Anxiety   .  Arthritis   . Cancer [facility])    Skin cancer  . Cataracts, both eyes 05/2014  . COPD (chronic obstructive pulmonary disease) (HCC)    intermittent home O2 - rarely uses  . Depression   . Diabetes mellitus without complication ([place])   . GERD (gastroesophageal reflux disease)   . Hyperkalemia   . Hyperlipidemia   . Hypertension   . Seizures ([place])    From RH Negative blood when pregnant  . Stroke ([place])   . Thrombocytosis ([place])   . Thyroid disease      Current Outpatient Medications:  .  albuterol (PROVENTIL) (2.5 MG/3ML) 0.083% nebulizer solution, Take 3 mLs (2.5 mg total) by nebulization every 6 (six) hours as needed for wheezing or shortness of breath., Disp: 75 mL, Rfl: 3 .  albuterol (VENTOLIN HFA) 108 (90 Base) MCG/ACT inhaler, Inhale 2 puffs into the lungs every 4 (four) hours as needed for wheezing or shortness of breath., Disp: 18 Inhaler, Rfl: 5 .  ALPRAZolam (XANAX) 0.5 MG tablet, Take 1 tablet (0.5 mg total) by mouth 3 (three) times daily as needed for sleep or anxiety., Disp: 10 tablet, Rfl: 0 .  aspirin EC 81 MG tablet, Take 81 mg by mouth daily., Disp: , Rfl:  .  feeding supplement, ENSURE ENLIVE, (ENSURE ENLIVE) LIQD, Take 237 mLs by mouth 2 (two) times daily between meals., Disp: 237 mL, Rfl: 12 .  fexofenadine-pseudoephedrine (ALLEGRA-D 24) 180-240 MG 24 hr tablet, Take 1 tablet by mouth daily as needed (allergies)., Disp: , Rfl:  .  gabapentin (NEURONTIN) 300 MG capsule, TAKE 1 CAPSULE (300 MG TOTAL) BY MOUTH AT BEDTIME., Disp: 30 capsule, Rfl: 1 .  guaiFENesin (MUCINEX) 600 MG 12 hr tablet, Take 1 tablet (600 mg total) by mouth 2 (two) times daily., Disp: 10 tablet, Rfl: 0 .  hydroxyurea (HYDREA) 500 MG capsule, TAKE 2 CAPSULES ON MON-TUES-WED-FRI-SAT. Take 1 po on thurs and sunday, Disp: 48 capsule, Rfl: 4 .  ipratropium (ATROVENT) 0.02 % nebulizer solution, Take 2.5 mLs (0.5 mg total) by nebulization every 6 (six) hours as needed for wheezing or shortness of breath., Disp: 75 mL, Rfl: 3 .  levothyroxine (SYNTHROID, LEVOTHROID) 112 MCG tablet, TAKE 1 TABLET BY MOUTH EVERY DAY, Disp: 90 tablet, Rfl: 0 .  metFORMIN (GLUCOPHAGE) 500 MG tablet, TAKE 1 TABLET (500 MG TOTAL) BY MOUTH 2 (TWO) TIMES DAILY WITH A MEAL., Disp: 60 tablet, Rfl: 0 .  metoprolol succinate (TOPROL-XL) 25 MG 24 hr tablet, TAKE 1 TABLET BY MOUTH EVERY DAY, Disp: 90 tablet, Rfl: 0 .  Multiple Vitamins-Minerals (ICAPS AREDS 2 PO), Take 1 capsule by mouth 2  (two) times daily., Disp: , Rfl:  .  omeprazole (PRILOSEC) 20 MG capsule, TAKE ONE CAPSULE BY MOUTH EVERY MORNING 30 MINUTES PRIOR TO YOUR FIRST MEAL, Disp: 31 capsule, Rfl: 11 .  PARoxetine (PAXIL) 20 MG tablet, TAKE 1 TABLET BY MOUTH EVERY DAY, Disp: 30 tablet, Rfl: 1 .  predniSONE (DELTASONE) 20 MG tablet, Take 1 tablet (20 mg total) by mouth daily with breakfast for 12 days., Disp: 16 tablet, Rfl: 0 .  promethazine (PHENERGAN) 12.5 MG tablet, Take 1 tablet (12.5 mg total) by mouth every 8 (eight) hours as needed for nausea or vomiting., Disp: 30 tablet, Rfl: 0 .  tiotropium (SPIRIVA HANDIHALER) 18 MCG inhalation capsule, Place 1 capsule (18 mcg total) into inhaler and inhale daily. (Patient not taking: Reported on 02/19/2017), Disp: 30 capsule, Rfl: 2 Social History   Socioeconomic History  . Marital  status: Married    Spouse name: Not on file  . Number of children: Not on file  . Years of education: Not on file  . Highest education level: Not on file  Social Needs  . Financial resource strain: Not on file  . Food insecurity - worry: Not on file  . Food insecurity - inability: Not on file  . Transportation needs - medical: Not on file  . Transportation needs - non-medical: Not on file  Occupational History  . Occupation: retired  Tobacco Use  . Smoking status: Former Smoker    Packs/day: 3.00    Years: 36.00    Pack years: 108.00    Types: Cigarettes    Last attempt to quit: 03/10/2000    Years since quitting: 16.9  . Smokeless tobacco: Never Used  Substance and Sexual Activity  . Alcohol use: No  . Drug use: No  . Sexual activity: No  Other Topics Concern  . Not on file  Social History Narrative  . Not on file   Family History  Problem Relation Age of Onset  . Alzheimer's disease Mother   . Arthritis Father   . Stroke Maternal Aunt     Objective: Office vital signs reviewed. BP 130/68   Pulse (!) 58   Temp (!) 97.5 F (36.4 C) (Oral)   Ht 5\' 6"  (1.676 m)   Wt  177 lb (80.3 kg)   SpO2 99% on 2L [place]  BMI 28.57 kg/m   Physical Examination:  General: Awake, alert, chronically ill appearing female, No acute distress HEENT: Normal, sclera white Cardio: slightly bradycardic w/ normal rhythm, S1S2 heard, no murmurs appreciated Pulm: Good air movement.  She does have prolonged expiratory phase with associated expiratory wheezes.  No rhonchi or rales; initially with 2 L of O2 on.  This was removed and patient had normal work of breathing on room air at rest but noted to have increased respiratory effort during ambulation.  Ambulatory oxygen as below. MSK: Requires cane for ambulation.  Gait is slow. Neuro: Alert, oriented, follows all commands, responds to questioning appropriately. Psych: Mood stable, speech normal, affect appropriate  Depression screen [facility] 2/9 02/19/2017 02/12/2017 09/11/2016  Decreased Interest 2 0 0  Down, Depressed, Hopeless 3 0 0  PHQ - 2 Score 5 0 0  Altered sleeping 3 - -  Tired, decreased energy 3 - -  Change in appetite 3 - -  Feeling bad or failure about yourself  2 - -  Trouble concentrating 3 - -  Moving slowly or fidgety/restless 3 - -  Suicidal thoughts 0 - -  PHQ-9 Score 22 - -   No flowsheet data found.   Ambulatory O2 vitals: Resting pulse ox on room air: 93% Ambulatory pulse ox on room air: 87% Ambulatory pulse ox on 2L O2: 98%  Assessment/ Plan: 76 y.o. female   1. COPD exacerbation ([place]) Doing well since discharge.  She is unable to afford Spiriva.  I attempted to replace this with a sample of another anticholinergic.  However, we had none in stock.  Instead, I have provided her Adair Patter in attempt to provide her some controller medication until she is seen by her pulmonologist.  Finances seem to be a limiting factor in her compliance with controller medications.  Hopefully, her pulmonologist will help [place] find an alternative that is low cost for patient.  Ambulatory O2 was performed today.  Patient did  qualify for home oxygen with an ambulatory pulse ox on  room air of 87%.  2 L of oxygen applied via nasal cannula which brought her ambulatory pulse ox saturation to 98%.  A DME for this was completed today and [facility] [place].  Additionally, we did discuss effects of benzodiazepine on the pulmonary system.  See below. - For home use only DME oxygen  2. Hospital discharge follow-up  3. Thoracic aortic atherosclerosis ([place]) Reviewed these findings from recent chest x-ray with the patient.  Could consider antilipid medication in this patient.  However, she is 76 years old with multiple comorbidities.  I recommend that she have a good conversation with her PCP with regards to risks versus benefits of lipid-lowering medications.  4. Nausea I did review with the patient and her daughter at length that prolonged use of promethazine is not recommended given the sedating side effects and potential for falls.  Additionally, prolonged use is associated with dystonic reaction.  I advised that they use this sparingly.  They voiced good understanding will follow-up with PCP as needed refills - promethazine (PHENERGAN) 12.5 MG tablet; Take 1 tablet (12.5 mg total) by mouth every 8 (eight) hours as needed for nausea or vomiting.  Dispense: 10 tablet; Refill: 0  5. Hypoxia DME completed. - For home use only DME oxygen  6. Anxiety Paxil 20 mg daily was refilled.  Xanax was discontinued and Ativan was prescribed to replace.  I recommended the patient follow-up with her PCP regarding further refills of this medication.  Would consider titrating off medicine if possible.   Orders Placed This Encounter  Procedures  . For home use only DME oxygen    Resting O2 on room air 93% Ambulatory O2 on room air 87%    Order Specific Question:   Mode or (Route)    Answer:   Nasal cannula    Order Specific Question:   Liters per Minute    Answer:   2    Order Specific Question:   Oxygen  delivery system    Answer:   Gas   Meds ordered this encounter  Medications  . promethazine (PHENERGAN) 12.5 MG tablet    Sig: Take 1 tablet (12.5 mg total) by mouth every 8 (eight) hours as needed for nausea or vomiting.    Dispense:  10 tablet    Refill:  0  . DISCONTD: LORazepam (ATIVAN) 0.5 MG tablet    Sig: Take 1 tablet (0.5 mg total) by mouth 3 times/day as needed-between meals & bedtime for anxiety or sleep.    Dispense:  30 tablet    Refill:  0  . fluticasone furoate-vilanterol (BREO ELLIPTA) 100-25 MCG/INH AEPB    Sig: Inhale 1 puff into the lungs daily.    Dispense:  2 each    Refill:  0  . LORazepam (ATIVAN) 0.5 MG tablet    Sig: Take 1 tablet (0.5 mg total) by mouth at bedtime as needed for anxiety or sleep.    Dispense:  30 tablet    Refill:  0   Total time spent with patient 34 minutes.  Greater than 50% of encounter spent in coordination of care/counseling.  Janora Norlander, DO [place] [phone]

## 2017-02-20 ENCOUNTER — Telehealth: Payer: Self-pay | Admitting: Internal Medicine

## 2017-02-23 ENCOUNTER — Other Ambulatory Visit: Payer: Self-pay | Admitting: *Deleted

## 2017-02-23 NOTE — Telephone Encounter (Signed)
Spoke with Jordan [name] and mad appt with SG on Wednesday at 3:30 pm. Nothing further is needed.

## 2017-02-23 NOTE — Patient Outreach (Signed)
[facility]) Care Management  02/23/2017  Miabella Shannahan Jun 22, 1940 [id number]  EMMI- COPD RED ON EMMI ALERT DAY#: 02/20/17 DATE: 3 RED ALERT: # of times rescue inhaler used in past 24 hours? 6   Outreach attempt #1 to patient. No answer. RN CM left HIPAA compliant message along with contact info.    Plan: RN CM will contact patient within one week.   [name], RN, BSN, MHA/MSL, [place] Telephonic Care Manager Coordinator [facility] Direct Phone: [phone] Cell Phone: [phone] Toll Free: [phone] Fax: [phone]

## 2017-02-23 NOTE — Telephone Encounter (Signed)
Okay to see SG or TP for HFU.

## 2017-02-23 NOTE — Telephone Encounter (Signed)
Spoke with Jordan [name], he states pt needs a follow up appointment with Jordan [name]. There are no openings and he is ok with her seeing an app. Jordan [name] please advise.   Current Outpatient Medications on File Prior to Visit  Medication Sig Dispense Refill  . albuterol (PROVENTIL) (2.5 MG/3ML) 0.083% nebulizer solution Take 3 mLs (2.5 mg total) by nebulization every 6 (six) hours as needed for wheezing or shortness of breath. 75 mL 3  . albuterol (VENTOLIN HFA) 108 (90 Base) MCG/ACT inhaler Inhale 2 puffs into the lungs every 4 (four) hours as needed for wheezing or shortness of breath. 18 Inhaler 5  . aspirin EC 81 MG tablet Take 81 mg by mouth daily.    . feeding supplement, ENSURE ENLIVE, (ENSURE ENLIVE) LIQD Take 237 mLs by mouth 2 (two) times daily between meals. 237 mL 12  . fexofenadine-pseudoephedrine (ALLEGRA-D 24) 180-240 MG 24 hr tablet Take 1 tablet by mouth daily as needed (allergies).    . fluticasone furoate-vilanterol (BREO ELLIPTA) 100-25 MCG/INH AEPB Inhale 1 puff into the lungs daily. 2 each 0  . gabapentin (NEURONTIN) 300 MG capsule TAKE 1 CAPSULE (300 MG TOTAL) BY MOUTH AT BEDTIME. 30 capsule 1  . guaiFENesin (MUCINEX) 600 MG 12 hr tablet Take 1 tablet (600 mg total) by mouth 2 (two) times daily. 10 tablet 0  . hydroxyurea (HYDREA) 500 MG capsule TAKE 2 CAPSULES ON MON-TUES-WED-FRI-SAT. Take 1 po on thurs and sunday 48 capsule 4  . ipratropium (ATROVENT) 0.02 % nebulizer solution Take 2.5 mLs (0.5 mg total) by nebulization every 6 (six) hours as needed for wheezing or shortness of breath. 75 mL 3  . levothyroxine (SYNTHROID, LEVOTHROID) 112 MCG tablet TAKE 1 TABLET BY MOUTH EVERY DAY 90 tablet 0  . LORazepam (ATIVAN) 0.5 MG tablet Take 1 tablet (0.5 mg total) by mouth at bedtime as needed for anxiety or sleep. 30 tablet 0  . metFORMIN (GLUCOPHAGE) 500 MG tablet TAKE 1 TABLET (500 MG TOTAL) BY MOUTH 2 (TWO) TIMES DAILY WITH A MEAL. 60 tablet 0  . metoprolol succinate (TOPROL-XL) 25 MG 24 hr tablet  TAKE 1 TABLET BY MOUTH EVERY DAY 90 tablet 0  . Multiple Vitamins-Minerals (ICAPS AREDS 2 PO) Take 1 capsule by mouth 2 (two) times daily.    Marland Kitchen omeprazole (PRILOSEC) 20 MG capsule TAKE ONE CAPSULE BY MOUTH EVERY MORNING 30 MINUTES PRIOR TO YOUR FIRST MEAL 31 capsule 11  . PARoxetine (PAXIL) 20 MG tablet TAKE 1 TABLET BY MOUTH EVERY DAY 30 tablet 1  . predniSONE (DELTASONE) 20 MG tablet Take 1 tablet (20 mg total) by mouth daily with breakfast for 12 days. 16 tablet 0  . promethazine (PHENERGAN) 12.5 MG tablet Take 1 tablet (12.5 mg total) by mouth every 8 (eight) hours as needed for nausea or vomiting. 10 tablet 0  . tiotropium (SPIRIVA HANDIHALER) 18 MCG inhalation capsule Place 1 capsule (18 mcg total) into inhaler and inhale daily. (Patient not taking: Reported on 02/19/2017) 30 capsule 2   No current facility-administered medications on file prior to visit.    Allergies  Allergen Reactions  . Bee Venom Anaphylaxis  . Codeine Itching  . Lipitor [Atorvastatin]     Extreme Dry Mouth, joint pain  . Penicillins Hives    Has patient had a PCN reaction causing immediate rash, facial/tongue/throat swelling, SOB or lightheadedness with hypotension: No Has patient had a PCN reaction causing severe rash involving mucus membranes or skin necrosis: Yes Has patient had a PCN reaction that  required hospitalization: No Has patient had a PCN reaction occurring within the last 10 years: No If all of the above answers are "NO", then may proceed with Cephalosporin use.   . Demeclocycline Rash  . Morphine And Related Rash  . Tetracyclines & Related Rash

## 2017-02-24 ENCOUNTER — Encounter: Payer: Self-pay | Admitting: *Deleted

## 2017-02-24 ENCOUNTER — Other Ambulatory Visit: Payer: Self-pay | Admitting: *Deleted

## 2017-02-24 ENCOUNTER — Other Ambulatory Visit: Payer: Self-pay | Admitting: Physician Assistant

## 2017-02-24 ENCOUNTER — Ambulatory Visit: Payer: Self-pay | Admitting: *Deleted

## 2017-02-24 MED ORDER — METFORMIN HCL 500 MG PO TABS: ORAL_TABLET | ORAL | 2 refills | Status: DC

## 2017-02-24 NOTE — Telephone Encounter (Signed)
Patient husband aware that [facility] will fill for cash price and new rx sent to them.

## 2017-02-24 NOTE — Patient Outreach (Addendum)
[facility]) Care Management  02/24/2017  Jordan [name] 03/01/1941 [id number]  EMMI- COPD RED ON EMMI ALERT DAY#: 02/20/17 DATE: 3 RED ALERT: # of times rescue inhaler used in past 24 hours? 6   Outreach attempt # 2 to patient. No answer. RN CM left HIPAA compliant message along with contact info.  Plan: RN CM will send unsuccessful outreach letter to patient.  RN CM will notify [facility] Case Management Assistant regarding case closure.   [name], RN, BSN, MHA/MSL, [place] Telephonic Care Manager Coordinator [facility] Direct Phone: [phone] Cell Phone: [phone] Toll Free: [phone] Fax: [phone]

## 2017-02-24 NOTE — Telephone Encounter (Signed)
This encounter was created in error - please disregard.

## 2017-02-24 NOTE — Telephone Encounter (Signed)
Pharmacy states that they can not fill until the 26th. What would you recommend?

## 2017-02-25 ENCOUNTER — Encounter: Payer: Self-pay | Admitting: Acute Care

## 2017-02-25 ENCOUNTER — Ambulatory Visit (INDEPENDENT_AMBULATORY_CARE_PROVIDER_SITE_OTHER): Payer: Medicare Other | Admitting: Acute Care

## 2017-02-25 DIAGNOSIS — J9621 Acute and chronic respiratory failure with hypoxia: Secondary | ICD-10-CM

## 2017-02-25 DIAGNOSIS — J441 Chronic obstructive pulmonary disease with (acute) exacerbation: Secondary | ICD-10-CM

## 2017-02-25 NOTE — Patient Instructions (Addendum)
It is nice to meet you Continue your Breo 1 puff daily without fail. Continue Spiriva daily We will instruct you on their use today. Use your Albuterol inhaler as needed Continue your atrovent  and Proventil nebs as yo have been doing. Continue wearing your oxygen at 2 L [place]. Saturation goal is 88-92% We will walk you at the next visit. Follow up in 2 months with Dr. Annamaria Boots or Judson Roch NP  Note your daily symptoms > remember "red flags" for COPD:  Increase in cough, increase in sputum production, increase in shortness of breath or activity tolerance. If you notice these symptoms, please call to be seen.   Please contact office for sooner follow up if symptoms do not improve or worsen or seek emergency care

## 2017-02-25 NOTE — Assessment & Plan Note (Signed)
Resolved 02/25/2017 Completed discharge  Z pack and pred taper Plan: Continue your Breo 1 puff daily without fail. Continue Spiriva daily We will instruct you on their use today. Use your Albuterol inhaler as needed Continue your atrovent  and Proventil nebs as yo have been doing. Continue wearing your oxygen at 2 L Jordan [name]. Saturation goal is 88-92% We will walk you at the next visit. Follow up in 2 months with Dr. Annamaria Boots or Judson Roch NP  Note your daily symptoms > remember "red flags" for COPD:  Increase in cough, increase in sputum production, increase in shortness of breath or activity tolerance. If you notice these symptoms, please call to be seen.   Please contact office for sooner follow up if symptoms do not improve or worsen or seek emergency care

## 2017-02-25 NOTE — Assessment & Plan Note (Addendum)
Pt. Non-compliant with home oxygen upon admission for COPD exacerbation Per Dr. Annamaria Boots, patient should be wearing at night at 2 L [place] and during the day as needed to maintain oxygen saturations 88-92% Instructed on inhaler use. Plan: Wear oxygen at 2 L  Saturation Goal is 88-92% Monitor with sat monitor to assure sats are within this range. Decrease oxygen at rest or if saturations are above this range. Will need walk test at follow up to determine oxygen needs during the day. Follow up in 2 months with Dr. Annamaria Boots or Judson Roch, NP Follow up sooner as needed. Please contact office for sooner follow up if symptoms do not improve or worsen or seek emergency care

## 2017-02-25 NOTE — Progress Notes (Signed)
History of Present Illness Jordan [name] is a 76 y.o. female former smoker ( 108 pack year smoking, quit 2002) with COPD.On home oxygen with questionable compliance. History of dysphagia.  She is followed by Dr. Annamaria Boots.   02/25/2017 Hospital Follow Up: Pt. Presents for hospital follow up after admission for COPD exacerbation, acute on chronic respiratory failure. She was admitted 02/12/2017-02/14/2017. Per the admission note :   76 year old female with COPD with chronic respiratory failure(On home O2 but non-adherent), nonadherent to home nebulizer, hypertension, diabetes mellitus, hyperlipidemia, depression presented with increasing cough with Greenish phlegm and shortness of breath, Since almost 3 weeks worsening for the past 1 week. Denied any fevers or chills. Patient went to see her PCP and was sent to the ED. Patient Was hypoxic In the ED, given nebs and was admitted for COPD exacerbation. CXR was negative for infection.  She was treated with IV Solu-Medrol. Albuterol neb was switched to Xopenex due to tremors. Continued on scheduled Atrovent nebs. Supportive care with continuous oxygen, antitussives and empiric azithromycin. Patient was counseled extensively  on being adherent to her nebulizer and oxygen.  She was given a  new prescription for albuterol and Atrovent nebs along with Spiriva inhaler. She was discharged with a 12 day prednisone taper and 5 days of azithromycin.She was Also counseled on Adherence to using her inhaler, as needed and  nebs .She was instructed to follow up with her PCP and Pulmonologist on discharge.  Patient states She was compliant with her prednisone taper and her z pack. She states she is much better. She is compliant with her Breo , Spiriva, Mucinex, Proventil,Atrovent . Both she and her husband needed instruction on inhaler use. She states her secretions have returned to baseline..Her cough has improved. She denies fever, chest pain or orthopnea or hemoptysis.  Denies any leg pain.   Test Results:  CBC Latest Ref Rng & Units 02/13/2017 02/12/2017 01/09/2017  WBC 4.0 - 10.5 K/uL 7.7 6.1 6.5  Hemoglobin 12.0 - 15.0 g/dL 12.6 13.5 13.3  Hematocrit 36.0 - 46.0 % 39.6 40.1 40.8  Platelets 150 - 400 K/uL 363 372 419(H)    BMP Latest Ref Rng & Units 02/13/2017 02/12/2017 01/09/2017  Glucose 65 - 99 mg/dL 194(H) 105(H) 120(H)  BUN 6 - 20 mg/dL 14 12 11   Creatinine 0.44 - 1.00 mg/dL 0.86 0.79 0.89  BUN/Creat Ratio 12 - 28 - - -  Sodium 135 - 145 mmol/L 134(L) 138 139  Potassium 3.5 - 5.1 mmol/L 3.9 4.0 5.5(H)  Chloride 101 - 111 mmol/L 99(L) 100(L) 100(L)  CO2 22 - 32 mmol/L 23 28 30   Calcium 8.9 - 10.3 mg/dL 9.0 9.1 9.5    Dg Chest 2 View  Result Date: 02/12/2017 CLINICAL DATA:  Productive cough, shortness of breath and chest cramping for the past week. Clinical pneumonia. Former smoker. History of COPD, diabetes, and hypertension. EXAM: CHEST  2 VIEW COMPARISON:  PA and lateral chest x-ray dated December 18, 2014 and June 08, 2014. FINDINGS: The lungs are mildly hyperinflated. The interstitial markings are coarse though stable. There is no alveolar infiltrate or pleural effusion. The heart and pulmonary vascularity are normal. There is calcification in the wall of the aortic arch. There is biapical pleural thickening which is stable. The bony thorax exhibits no acute abnormality. There is a breast implant on the right exhibiting capsular calcification. IMPRESSION: COPD.  No pneumonia nor CHF. Thoracic aortic atherosclerosis. Electronically Signed   By: David  Martinique M.D.  On: 02/12/2017 12:38     Past medical hx Past Medical History:  Diagnosis Date  . Anxiety   . Arthritis   . Cancer [facility])    Skin cancer  . Cataracts, both eyes 05/2014  . COPD (chronic obstructive pulmonary disease) (HCC)    intermittent home O2 - rarely uses  . Depression   . Diabetes mellitus without complication ([place])   . GERD (gastroesophageal reflux disease)   .  Hyperkalemia   . Hyperlipidemia   . Hypertension   . Seizures ([place])    From RH Negative blood when pregnant  . Stroke ([place])   . Thrombocytosis ([place])   . Thyroid disease      Social History   Tobacco Use  . Smoking status: Former Smoker    Packs/day: 3.00    Years: 36.00    Pack years: 108.00    Types: Cigarettes    Last attempt to quit: 03/10/2000    Years since quitting: 16.9  . Smokeless tobacco: Never Used  Substance Use Topics  . Alcohol use: No  . Drug use: No    Jordan [name] reports that she quit smoking about 16 years ago. Her smoking use included cigarettes. She has a 108.00 pack-year smoking history. she has never used smokeless tobacco. She reports that she does not drink alcohol or use drugs.  Tobacco Cessation: Former smoker with a 108 pack year smoking history, quit 2002.  Past surgical hx, Family hx, Social hx all reviewed.  Current Outpatient Medications on File Prior to Visit  Medication Sig  . albuterol (PROVENTIL) (2.5 MG/3ML) 0.083% nebulizer solution Take 3 mLs (2.5 mg total) by nebulization every 6 (six) hours as needed for wheezing or shortness of breath.  Marland Kitchen albuterol (VENTOLIN HFA) 108 (90 Base) MCG/ACT inhaler Inhale 2 puffs into the lungs every 4 (four) hours as needed for wheezing or shortness of breath.  Marland Kitchen aspirin EC 81 MG tablet Take 81 mg by mouth daily.  . fexofenadine-pseudoephedrine (ALLEGRA-D 24) 180-240 MG 24 hr tablet Take 1 tablet by mouth daily as needed (allergies).  . fluticasone furoate-vilanterol (BREO ELLIPTA) 100-25 MCG/INH AEPB Inhale 1 puff into the lungs daily.  Marland Kitchen gabapentin (NEURONTIN) 300 MG capsule TAKE 1 CAPSULE (300 MG TOTAL) BY MOUTH AT BEDTIME.  Marland Kitchen guaiFENesin (MUCINEX) 600 MG 12 hr tablet Take 1 tablet (600 mg total) by mouth 2 (two) times daily.  . hydroxyurea (HYDREA) 500 MG capsule TAKE 2 CAPSULES ON MON-TUES-WED-FRI-SAT. Take 1 po on thurs and sunday  . ipratropium (ATROVENT) 0.02 % nebulizer solution Take 2.5 mLs (0.5 mg  total) by nebulization every 6 (six) hours as needed for wheezing or shortness of breath.  . levothyroxine (SYNTHROID, LEVOTHROID) 112 MCG tablet TAKE 1 TABLET BY MOUTH EVERY DAY  . LORazepam (ATIVAN) 0.5 MG tablet Take 1 tablet (0.5 mg total) by mouth at bedtime as needed for anxiety or sleep.  . metFORMIN (GLUCOPHAGE) 500 MG tablet TAKE 1 TABLET (500 MG TOTAL) BY MOUTH 2 (TWO) TIMES DAILY WITH A MEAL.  . metoprolol succinate (TOPROL-XL) 25 MG 24 hr tablet TAKE 1 TABLET BY MOUTH EVERY DAY  . Multiple Vitamins-Minerals (ICAPS AREDS 2 PO) Take 1 capsule by mouth 2 (two) times daily.  Marland Kitchen omeprazole (PRILOSEC) 20 MG capsule TAKE ONE CAPSULE BY MOUTH EVERY MORNING 30 MINUTES PRIOR TO YOUR FIRST MEAL  . PARoxetine (PAXIL) 20 MG tablet TAKE 1 TABLET BY MOUTH EVERY DAY  . predniSONE (DELTASONE) 20 MG tablet Take 1 tablet (20 mg total) by  mouth daily with breakfast for 12 days.  . promethazine (PHENERGAN) 12.5 MG tablet Take 1 tablet (12.5 mg total) by mouth every 8 (eight) hours as needed for nausea or vomiting.  . tiotropium (SPIRIVA HANDIHALER) 18 MCG inhalation capsule Place 1 capsule (18 mcg total) into inhaler and inhale daily.   No current facility-administered medications on file prior to visit.      Allergies  Allergen Reactions  . Bee Venom Anaphylaxis  . Codeine Itching  . Lipitor [Atorvastatin]     Extreme Dry Mouth, joint pain  . Penicillins Hives    Has patient had a PCN reaction causing immediate rash, facial/tongue/throat swelling, SOB or lightheadedness with hypotension: No Has patient had a PCN reaction causing severe rash involving mucus membranes or skin necrosis: Yes Has patient had a PCN reaction that required hospitalization: No Has patient had a PCN reaction occurring within the last 10 years: No If all of the above answers are "NO", then may proceed with Cephalosporin use.   . Demeclocycline Rash  . Morphine And Related Rash  . Tetracyclines & Related Rash    Review  Of Systems:  Constitutional:   No  weight loss, night sweats,  Fevers, chills, +fatigue, or  lassitude.  HEENT:   No headaches,  Difficulty swallowing,  Tooth/dental problems, or  Sore throat,                No sneezing, itching, ear ache, nasal congestion, post nasal drip,   CV:  No chest pain,  Orthopnea, PND, swelling in lower extremities, anasarca, dizziness, palpitations, syncope.   GI  No heartburn, indigestion, abdominal pain, nausea, vomiting, diarrhea, change in bowel habits, loss of appetite, bloody stools.   Resp: + shortness of breath with exertion not at rest.  + baseline  excess mucus, + baseline  productive cough,  No non-productive cough,  No coughing up of blood.  + baseline  color of mucus.  No wheezing.  No chest wall deformity  Skin: no rash or lesions.  GU: no dysuria, change in color of urine, no urgency or frequency.  No flank pain, no hematuria   MS:  No joint pain or swelling.  No decreased range of motion.  No back pain.  Psych:  No change in mood or affect. No depression or anxiety.  No memory loss.   Vital Signs BP 124/74 (BP Location: Left Arm, Cuff Size: Normal)   Pulse 63   Ht 5\' 6"  (1.676 m)   Wt 175 lb (79.4 kg)   SpO2 98%   BMI 28.25 kg/m    Physical Exam:  General- No distress,  A&Ox3, frail appearing female in a wheel chair ENT: No sinus tenderness, TM clear, pale nasal mucosa, no oral exudate,no post nasal drip, no LAN Cardiac: S1, S2, regular rate and rhythm, no murmur Chest: No wheeze/ rales/ dullness; no accessory muscle use, no nasal flaring, no sternal retractions, prolonged expiratory phase, diminished per bases. Abd.: Soft Non-tender, non-distended, non-obese. Ext: No clubbing cyanosis, 1+ BLE edema Neuro: Deconditioned at baseline, MAE x 4, A&O x 3, appropriate Skin: No rashes, warm and dry, intact, no lesions noted Psych: normal mood and behavior   Assessment/Plan  Acute exacerbation of chronic obstructive pulmonary disease  (COPD) ([place]) Resolved 02/25/2017 Completed discharge  Z pack and pred taper Plan: Continue your Breo 1 puff daily without fail. Continue Spiriva daily We will instruct you on their use today. Use your Albuterol inhaler as needed Continue your atrovent  and Proventil  nebs as yo have been doing. Continue wearing your oxygen at 2 L [place]. Saturation goal is 88-92% We will walk you at the next visit. Follow up in 2 months with Dr. Annamaria Boots or Judson Roch NP  Note your daily symptoms > remember "red flags" for COPD:  Increase in cough, increase in sputum production, increase in shortness of breath or activity tolerance. If you notice these symptoms, please call to be seen.   Please contact office for sooner follow up if symptoms do not improve or worsen or seek emergency care     Acute on chronic respiratory failure with hypoxia ([place]) Pt. Non-compliant with home oxygen upon admission for COPD exacerbation Per Dr. Annamaria Boots, patient should be wearing at night at 2 L [place] and during the day as needed to maintain oxygen saturations 88-92% Instructed on inhaler use. Plan: Wear oxygen at 2 L  Saturation Goal is 88-92% Monitor with sat monitor to assure sats are within this range. Decrease oxygen at rest or if saturations are above this range. Will need walk test at follow up to determine oxygen needs during the day. Follow up in 2 months with Dr. Annamaria Boots or Judson Roch, NP Follow up sooner as needed. Please contact office for sooner follow up if symptoms do not improve or worsen or seek emergency care     Magdalen Spatz, NP 02/25/2017  11:23 PM

## 2017-03-05 ENCOUNTER — Telehealth: Payer: Self-pay | Admitting: Physician Assistant

## 2017-03-05 NOTE — Telephone Encounter (Signed)
Patient's husband talked to [place]

## 2017-03-23 ENCOUNTER — Telehealth: Payer: Self-pay | Admitting: Physician Assistant

## 2017-03-23 NOTE — Telephone Encounter (Signed)
Returned pharmacies phone call and instructions given

## 2017-03-29 ENCOUNTER — Other Ambulatory Visit: Payer: Self-pay | Admitting: Family Medicine

## 2017-04-05 ENCOUNTER — Other Ambulatory Visit: Payer: Self-pay | Admitting: Family Medicine

## 2017-04-10 ENCOUNTER — Ambulatory Visit (INDEPENDENT_AMBULATORY_CARE_PROVIDER_SITE_OTHER): Payer: Medicare Other | Admitting: Family Medicine

## 2017-04-10 ENCOUNTER — Encounter: Payer: Self-pay | Admitting: Family Medicine

## 2017-04-10 VITALS — BP 120/59 | HR 81 | Temp 98.9°F | Ht 66.0 in | Wt 173.2 lb

## 2017-04-10 DIAGNOSIS — R52 Pain, unspecified: Secondary | ICD-10-CM

## 2017-04-10 DIAGNOSIS — J441 Chronic obstructive pulmonary disease with (acute) exacerbation: Secondary | ICD-10-CM

## 2017-04-10 LAB — VERITOR FLU A/B WAIVED
INFLUENZA B: NEGATIVE
Influenza A: NEGATIVE

## 2017-04-10 MED ORDER — LEVOFLOXACIN 750 MG PO TABS
750.0000 mg | ORAL_TABLET | Freq: Every day | ORAL | 0 refills | Status: DC
Start: 2017-04-10 — End: 2017-04-17

## 2017-04-10 MED ORDER — PREDNISONE 20 MG PO TABS
40.0000 mg | ORAL_TABLET | Freq: Every day | ORAL | 0 refills | Status: DC
Start: 2017-04-10 — End: 2017-04-12

## 2017-04-10 MED ORDER — TRAZODONE HCL 100 MG PO TABS: 50.0000 mg | ORAL_TABLET | Freq: Every day | ORAL | 3 refills | Status: DC

## 2017-04-10 NOTE — Progress Notes (Signed)
   HPI  Patient presents today with difficulty breathing.  Patient explains that since her last hospitalization she never quite returned to baseline. For the last 2 days she has had cough, chills, body aches, and congestion. She is on her usual oxygen dose, 2 L via nasal cannula.  She is tolerating fluids but has decreased appetite.  She does not prefer taking prednisone and is not sure if it is helping her. She states that albuterol nebulizers do help.  She is also having difficulty sleeping and requests Ativan for this  PMH: Smoking status noted ROS: Per HPI  Objective: BP (!) 120/59   Pulse 81   Temp 98.9 F (37.2 C) (Oral)   Ht 5\' 6"  (1.676 m)   Wt 173 lb 3.2 oz (78.6 kg)   SpO2 97%   BMI 27.96 kg/m  Gen: NAD, alert, cooperative with exam HEENT: NCAT CV: RRR, good S1/S2, no murmur Resp: Good air movement, expiratory sounds with some wheezing and coarse breath sounds in the right base Ext: No edema, warm Neuro: Alert and oriented, No gross deficits  Assessment and plan:  #COPD exacerbation With Levaquin plus prednisone, patient does not really want to try prednisone however I did recommend considering. She requests Ativan for sleep, given severe COPD I did not recommend this, I have tried a small dose of trazodone. Given the very low threshold for seeking emergent medical care, however she appears comfortable today     Orders Placed This Encounter  Procedures  . Veritor Flu A/B Waived    Order Specific Question:   Source    Answer:   nasal    Meds ordered this encounter  Medications  . predniSONE (DELTASONE) 20 MG tablet    Sig: Take 2 tablets (40 mg total) by mouth daily with breakfast.    Dispense:  10 tablet    Refill:  0  . levofloxacin (LEVAQUIN) 750 MG tablet    Sig: Take 1 tablet (750 mg total) by mouth daily.    Dispense:  7 tablet    Refill:  0  . traZODone (DESYREL) 100 MG tablet    Sig: Take 0.5-1 tablets (50-100 mg total) by mouth at  bedtime.    Dispense:  30 tablet    Refill:  [place], MD [place] 04/10/2017, 5:09 PM

## 2017-04-10 NOTE — Patient Instructions (Addendum)
Great to see you!   Consider trying the prednisone, I think it should help.   Try trazodone for sleep.

## 2017-04-12 ENCOUNTER — Emergency Department (HOSPITAL_COMMUNITY): Payer: Medicare Other

## 2017-04-12 ENCOUNTER — Other Ambulatory Visit: Payer: Self-pay

## 2017-04-12 ENCOUNTER — Encounter (HOSPITAL_COMMUNITY): Payer: Self-pay | Admitting: Emergency Medicine

## 2017-04-12 ENCOUNTER — Inpatient Hospital Stay (HOSPITAL_COMMUNITY)
Admission: EM | Admit: 2017-04-12 | Discharge: 2017-04-17 | DRG: 190 | Disposition: A | Discharge status: Home Health | Payer: Medicare Other | Attending: Internal Medicine | Admitting: Internal Medicine

## 2017-04-12 DIAGNOSIS — Z823 Family history of stroke: Secondary | ICD-10-CM

## 2017-04-12 DIAGNOSIS — J4489 Other specified chronic obstructive pulmonary disease: Secondary | ICD-10-CM | POA: Diagnosis present

## 2017-04-12 DIAGNOSIS — D473 Essential (hemorrhagic) thrombocythemia: Secondary | ICD-10-CM | POA: Diagnosis present

## 2017-04-12 DIAGNOSIS — K219 Gastro-esophageal reflux disease without esophagitis: Secondary | ICD-10-CM | POA: Diagnosis not present

## 2017-04-12 DIAGNOSIS — Z7982 Long term (current) use of aspirin: Secondary | ICD-10-CM

## 2017-04-12 DIAGNOSIS — E785 Hyperlipidemia, unspecified: Secondary | ICD-10-CM | POA: Diagnosis present

## 2017-04-12 DIAGNOSIS — Z881 Allergy status to other antibiotic agents status: Secondary | ICD-10-CM

## 2017-04-12 DIAGNOSIS — Z91030 Bee allergy status: Secondary | ICD-10-CM

## 2017-04-12 DIAGNOSIS — E039 Hypothyroidism, unspecified: Secondary | ICD-10-CM | POA: Diagnosis present

## 2017-04-12 DIAGNOSIS — I700 Atherosclerosis of aorta: Secondary | ICD-10-CM | POA: Diagnosis present

## 2017-04-12 DIAGNOSIS — Z85828 Personal history of other malignant neoplasm of skin: Secondary | ICD-10-CM

## 2017-04-12 DIAGNOSIS — Z7951 Long term (current) use of inhaled steroids: Secondary | ICD-10-CM

## 2017-04-12 DIAGNOSIS — N179 Acute kidney failure, unspecified: Secondary | ICD-10-CM | POA: Diagnosis present

## 2017-04-12 DIAGNOSIS — R05 Cough: Secondary | ICD-10-CM | POA: Diagnosis not present

## 2017-04-12 DIAGNOSIS — J9621 Acute and chronic respiratory failure with hypoxia: Secondary | ICD-10-CM | POA: Diagnosis present

## 2017-04-12 DIAGNOSIS — M1990 Unspecified osteoarthritis, unspecified site: Secondary | ICD-10-CM | POA: Diagnosis present

## 2017-04-12 DIAGNOSIS — C946 Myelodysplastic disease, not classified: Secondary | ICD-10-CM | POA: Diagnosis not present

## 2017-04-12 DIAGNOSIS — R0602 Shortness of breath: Secondary | ICD-10-CM

## 2017-04-12 DIAGNOSIS — T380X5A Adverse effect of glucocorticoids and synthetic analogues, initial encounter: Secondary | ICD-10-CM | POA: Diagnosis not present

## 2017-04-12 DIAGNOSIS — Z515 Encounter for palliative care: Secondary | ICD-10-CM

## 2017-04-12 DIAGNOSIS — Z820 Family history of epilepsy and other diseases of the nervous system: Secondary | ICD-10-CM

## 2017-04-12 DIAGNOSIS — R0603 Acute respiratory distress: Secondary | ICD-10-CM | POA: Diagnosis not present

## 2017-04-12 DIAGNOSIS — D75839 Thrombocytosis, unspecified: Secondary | ICD-10-CM | POA: Diagnosis present

## 2017-04-12 DIAGNOSIS — Z885 Allergy status to narcotic agent status: Secondary | ICD-10-CM

## 2017-04-12 DIAGNOSIS — Z8673 Personal history of transient ischemic attack (TIA), and cerebral infarction without residual deficits: Secondary | ICD-10-CM

## 2017-04-12 DIAGNOSIS — J441 Chronic obstructive pulmonary disease with (acute) exacerbation: Secondary | ICD-10-CM | POA: Diagnosis not present

## 2017-04-12 DIAGNOSIS — I10 Essential (primary) hypertension: Secondary | ICD-10-CM | POA: Diagnosis present

## 2017-04-12 DIAGNOSIS — F419 Anxiety disorder, unspecified: Secondary | ICD-10-CM | POA: Diagnosis present

## 2017-04-12 DIAGNOSIS — Z7984 Long term (current) use of oral hypoglycemic drugs: Secondary | ICD-10-CM

## 2017-04-12 DIAGNOSIS — Z8261 Family history of arthritis: Secondary | ICD-10-CM

## 2017-04-12 DIAGNOSIS — J029 Acute pharyngitis, unspecified: Secondary | ICD-10-CM | POA: Diagnosis not present

## 2017-04-12 DIAGNOSIS — H269 Unspecified cataract: Secondary | ICD-10-CM | POA: Diagnosis present

## 2017-04-12 DIAGNOSIS — F329 Major depressive disorder, single episode, unspecified: Secondary | ICD-10-CM | POA: Diagnosis present

## 2017-04-12 DIAGNOSIS — Z9981 Dependence on supplemental oxygen: Secondary | ICD-10-CM

## 2017-04-12 DIAGNOSIS — Z66 Do not resuscitate: Secondary | ICD-10-CM | POA: Diagnosis present

## 2017-04-12 DIAGNOSIS — G609 Hereditary and idiopathic neuropathy, unspecified: Secondary | ICD-10-CM | POA: Diagnosis present

## 2017-04-12 DIAGNOSIS — J449 Chronic obstructive pulmonary disease, unspecified: Secondary | ICD-10-CM | POA: Diagnosis present

## 2017-04-12 DIAGNOSIS — E119 Type 2 diabetes mellitus without complications: Secondary | ICD-10-CM

## 2017-04-12 DIAGNOSIS — Z880 Allergy status to penicillin: Secondary | ICD-10-CM

## 2017-04-12 DIAGNOSIS — Z79890 Hormone replacement therapy (postmenopausal): Secondary | ICD-10-CM

## 2017-04-12 DIAGNOSIS — Z87891 Personal history of nicotine dependence: Secondary | ICD-10-CM

## 2017-04-12 LAB — COMPREHENSIVE METABOLIC PANEL
ALT: 9 U/L — AB (ref 14–54)
AST: 18 U/L (ref 15–41)
Albumin: 4 g/dL (ref 3.5–5.0)
Alkaline Phosphatase: 75 U/L (ref 38–126)
Anion gap: 14 (ref 5–15)
BILIRUBIN TOTAL: 1.4 mg/dL — AB (ref 0.3–1.2)
BUN: 15 mg/dL (ref 6–20)
CO2: 23 mmol/L (ref 22–32)
CREATININE: 1.02 mg/dL — AB (ref 0.44–1.00)
Calcium: 9.1 mg/dL (ref 8.9–10.3)
Chloride: 99 mmol/L — ABNORMAL LOW (ref 101–111)
GFR calc Af Amer: 60 mL/min — ABNORMAL LOW (ref >60)
GFR, EST NON AFRICAN AMERICAN: 52 mL/min — AB (ref >60)
Glucose, Bld: 131 mg/dL — ABNORMAL HIGH (ref 65–99)
POTASSIUM: 3.8 mmol/L (ref 3.5–5.1)
Sodium: 136 mmol/L (ref 135–145)
TOTAL PROTEIN: 7.4 g/dL (ref 6.5–8.1)

## 2017-04-12 LAB — CBC WITH DIFFERENTIAL/PLATELET
BASOS PCT: 0 %
Basophils Absolute: 0 10*3/uL (ref 0.0–0.1)
EOS ABS: 0.3 10*3/uL (ref 0.0–0.7)
EOS PCT: 3 %
HCT: 40.9 % (ref 36.0–46.0)
Hemoglobin: 13.1 g/dL (ref 12.0–15.0)
Lymphocytes Relative: 12 %
Lymphs Abs: 1.1 10*3/uL (ref 0.7–4.0)
MCH: 35.6 pg — ABNORMAL HIGH (ref 26.0–34.0)
MCHC: 32 g/dL (ref 30.0–36.0)
MCV: 111.1 fL — ABNORMAL HIGH (ref 78.0–100.0)
Monocytes Absolute: 1 10*3/uL (ref 0.1–1.0)
Monocytes Relative: 11 %
Neutro Abs: 6.6 10*3/uL (ref 1.7–7.7)
Neutrophils Relative %: 74 %
PLATELETS: 414 10*3/uL — AB (ref 150–400)
RBC: 3.68 MIL/uL — AB (ref 3.87–5.11)
RDW: 14 % (ref 11.5–15.5)
WBC: 9 10*3/uL (ref 4.0–10.5)

## 2017-04-12 LAB — I-STAT CG4 LACTIC ACID, ED: Lactic Acid, Venous: 1.86 mmol/L (ref 0.5–1.9)

## 2017-04-12 LAB — GLUCOSE, CAPILLARY: GLUCOSE-CAPILLARY: 388 mg/dL — AB (ref 65–99)

## 2017-04-12 LAB — TROPONIN I

## 2017-04-12 MED ORDER — IPRATROPIUM-ALBUTEROL 0.5-2.5 (3) MG/3ML IN SOLN
3.0000 mL | Freq: Four times a day (QID) | RESPIRATORY_TRACT | Status: DC
  Administered 2017-04-12 – 2017-04-13 (×2): 3 mL via RESPIRATORY_TRACT
  Filled 2017-04-12 (×2): qty 3

## 2017-04-12 MED ORDER — METOPROLOL SUCCINATE ER 25 MG PO TB24
25.0000 mg | ORAL_TABLET | Freq: Every day | ORAL | Status: DC
  Administered 2017-04-12 – 2017-04-17 (×6): 25 mg via ORAL
  Filled 2017-04-12 (×6): qty 1

## 2017-04-12 MED ORDER — GABAPENTIN 300 MG PO CAPS
300.0000 mg | ORAL_CAPSULE | Freq: Every day | ORAL | Status: DC
  Administered 2017-04-12 – 2017-04-16 (×5): 300 mg via ORAL
  Filled 2017-04-12 (×5): qty 1

## 2017-04-12 MED ORDER — PANTOPRAZOLE SODIUM 40 MG PO TBEC
40.0000 mg | DELAYED_RELEASE_TABLET | Freq: Every day | ORAL | Status: DC
  Administered 2017-04-13 – 2017-04-17 (×5): 40 mg via ORAL
  Filled 2017-04-12 (×5): qty 1

## 2017-04-12 MED ORDER — ALBUTEROL (5 MG/ML) CONTINUOUS INHALATION SOLN
10.0000 mg/h | INHALATION_SOLUTION | RESPIRATORY_TRACT | Status: DC
  Administered 2017-04-12: 10 mg/h via RESPIRATORY_TRACT
  Filled 2017-04-12: qty 20

## 2017-04-12 MED ORDER — PAROXETINE HCL 20 MG PO TABS
20.0000 mg | ORAL_TABLET | Freq: Every day | ORAL | Status: DC
  Administered 2017-04-13 – 2017-04-17 (×5): 20 mg via ORAL
  Filled 2017-04-12 (×5): qty 1

## 2017-04-12 MED ORDER — HYDROXYUREA 500 MG PO CAPS
500.0000 mg | ORAL_CAPSULE | ORAL | Status: DC
  Administered 2017-04-16: 500 mg via ORAL
  Filled 2017-04-12 (×3): qty 1

## 2017-04-12 MED ORDER — ONDANSETRON HCL 4 MG/2ML IJ SOLN
4.0000 mg | Freq: Once | INTRAMUSCULAR | Status: AC
  Administered 2017-04-12: 4 mg via INTRAVENOUS

## 2017-04-12 MED ORDER — ENOXAPARIN SODIUM 40 MG/0.4ML ~~LOC~~ SOLN
40.0000 mg | SUBCUTANEOUS | Status: DC
  Administered 2017-04-12 – 2017-04-16 (×5): 40 mg via SUBCUTANEOUS
  Filled 2017-04-12 (×5): qty 0.4

## 2017-04-12 MED ORDER — POTASSIUM CHLORIDE IN NACL 20-0.9 MEQ/L-% IV SOLN
INTRAVENOUS | Status: AC
  Administered 2017-04-12: 19:00:00 via INTRAVENOUS

## 2017-04-12 MED ORDER — GUAIFENESIN ER 600 MG PO TB12
600.0000 mg | ORAL_TABLET | Freq: Two times a day (BID) | ORAL | Status: DC
  Administered 2017-04-12 – 2017-04-17 (×10): 600 mg via ORAL
  Filled 2017-04-12 (×10): qty 1

## 2017-04-12 MED ORDER — HYDROXYUREA 500 MG PO CAPS
1000.0000 mg | ORAL_CAPSULE | ORAL | Status: DC
  Administered 2017-04-13 – 2017-04-17 (×4): 1000 mg via ORAL
  Filled 2017-04-12 (×3): qty 2

## 2017-04-12 MED ORDER — INSULIN ASPART 100 UNIT/ML ~~LOC~~ SOLN
0.0000 [IU] | Freq: Three times a day (TID) | SUBCUTANEOUS | Status: DC
  Administered 2017-04-13 (×2): 2 [IU] via SUBCUTANEOUS
  Administered 2017-04-13: 1 [IU] via SUBCUTANEOUS
  Administered 2017-04-14: 2 [IU] via SUBCUTANEOUS
  Administered 2017-04-14: 3 [IU] via SUBCUTANEOUS
  Administered 2017-04-14: 2 [IU] via SUBCUTANEOUS
  Administered 2017-04-15: 5 [IU] via SUBCUTANEOUS
  Administered 2017-04-15 – 2017-04-16 (×5): 2 [IU] via SUBCUTANEOUS

## 2017-04-12 MED ORDER — ALPRAZOLAM 0.5 MG PO TABS
0.5000 mg | ORAL_TABLET | Freq: Two times a day (BID) | ORAL | Status: DC | PRN
  Administered 2017-04-16: 0.5 mg via ORAL
  Filled 2017-04-12: qty 1

## 2017-04-12 MED ORDER — VANCOMYCIN HCL IN DEXTROSE 1-5 GM/200ML-% IV SOLN: 1000.0000 mg | Freq: Once | INTRAVENOUS | Status: DC

## 2017-04-12 MED ORDER — AZITHROMYCIN 250 MG PO TABS
500.0000 mg | ORAL_TABLET | Freq: Every day | ORAL | Status: DC
  Administered 2017-04-12 – 2017-04-17 (×6): 500 mg via ORAL
  Filled 2017-04-12 (×6): qty 2

## 2017-04-12 MED ORDER — SODIUM CHLORIDE 0.9 % IV SOLN
1500.0000 mg | Freq: Once | INTRAVENOUS | Status: AC
  Administered 2017-04-12: 1500 mg via INTRAVENOUS
  Filled 2017-04-12: qty 1500

## 2017-04-12 MED ORDER — ASPIRIN EC 81 MG PO TBEC
81.0000 mg | DELAYED_RELEASE_TABLET | Freq: Every day | ORAL | Status: DC
  Administered 2017-04-13 – 2017-04-17 (×5): 81 mg via ORAL
  Filled 2017-04-12 (×5): qty 1

## 2017-04-12 MED ORDER — TRAZODONE HCL 50 MG PO TABS
100.0000 mg | ORAL_TABLET | Freq: Every day | ORAL | Status: DC
  Administered 2017-04-12 – 2017-04-16 (×5): 100 mg via ORAL
  Filled 2017-04-12 (×5): qty 2

## 2017-04-12 MED ORDER — SODIUM CHLORIDE 0.9 % IV SOLN
1000.0000 mL | INTRAVENOUS | Status: DC
  Administered 2017-04-12: 1000 mL via INTRAVENOUS

## 2017-04-12 MED ORDER — AZTREONAM 2 G IJ SOLR
2.0000 g | Freq: Once | INTRAMUSCULAR | Status: AC
  Administered 2017-04-12: 2 g via INTRAVENOUS
  Filled 2017-04-12: qty 2

## 2017-04-12 MED ORDER — VANCOMYCIN HCL IN DEXTROSE 1-5 GM/200ML-% IV SOLN: 1000.0000 mg | INTRAVENOUS | Status: DC

## 2017-04-12 MED ORDER — BUDESONIDE 0.25 MG/2ML IN SUSP
0.2500 mg | Freq: Two times a day (BID) | RESPIRATORY_TRACT | Status: DC
  Administered 2017-04-12 – 2017-04-17 (×10): 0.25 mg via RESPIRATORY_TRACT
  Filled 2017-04-12 (×11): qty 2

## 2017-04-12 MED ORDER — METHYLPREDNISOLONE SODIUM SUCC 40 MG IJ SOLR
40.0000 mg | Freq: Two times a day (BID) | INTRAMUSCULAR | Status: DC
  Administered 2017-04-13 – 2017-04-16 (×7): 40 mg via INTRAVENOUS
  Filled 2017-04-12 (×7): qty 1

## 2017-04-12 MED ORDER — LEVOTHYROXINE SODIUM 112 MCG PO TABS
112.0000 ug | ORAL_TABLET | Freq: Every day | ORAL | Status: DC
  Administered 2017-04-13 – 2017-04-17 (×5): 112 ug via ORAL
  Filled 2017-04-12 (×5): qty 1

## 2017-04-12 MED ORDER — ONDANSETRON HCL 4 MG/2ML IJ SOLN: 4.0000 mg | Freq: Four times a day (QID) | INTRAMUSCULAR | Status: DC | PRN

## 2017-04-12 MED ORDER — METHYLPREDNISOLONE SODIUM SUCC 125 MG IJ SOLR
125.0000 mg | Freq: Once | INTRAMUSCULAR | Status: AC
  Administered 2017-04-12: 125 mg via INTRAVENOUS
  Filled 2017-04-12: qty 2

## 2017-04-12 MED ORDER — ONDANSETRON HCL 4 MG PO TABS: 4.0000 mg | ORAL_TABLET | Freq: Four times a day (QID) | ORAL | Status: DC | PRN

## 2017-04-12 MED ORDER — DEXTROSE 5 % IV SOLN
1.0000 g | Freq: Three times a day (TID) | INTRAVENOUS | Status: DC
  Administered 2017-04-12 – 2017-04-13 (×2): 1 g via INTRAVENOUS
  Filled 2017-04-12 (×8): qty 1

## 2017-04-12 MED ORDER — ONDANSETRON HCL 4 MG/2ML IJ SOLN
INTRAMUSCULAR | Status: AC
  Administered 2017-04-12: 4 mg via INTRAVENOUS
  Filled 2017-04-12: qty 2

## 2017-04-12 NOTE — ED Notes (Signed)
EDP at bedside  

## 2017-04-12 NOTE — ED Provider Notes (Signed)
[facility] EMERGENCY DEPARTMENT Provider Note   CSN: [id number] Arrival date & time: 04/12/17  1138     History   Chief Complaint Chief Complaint  Patient presents with  . Shortness of Breath    HPI Jordan [name] is a 77 y.o. female.  HPI  The pt is a 77 y/o female - she has hx of COPD and a "torn larynx" - she has hx of anxiety, DM, and GERD.  She presents to the hospital today with a complaint of shortness of breath with coughing, progressive wheezing and shortness of breath as well as productive cough of green phlegm tainted with blood.  She has had a progressive illness over a week, was seen at her doctor's office several days ago and prescribed Levaquin and prednisone which she states does not help and she has continued to decompensate.  The patient is unable to even get out of a chair without becoming severely dyspneic where she is usually able to move around the house.  She is on chronic oxygen by nasal cannula.  She endorses subjective fevers, no swelling of the legs, denies abdominal pain back pain or headaches.  Medications but has not been getting any relief  Past Medical History:  Diagnosis Date  . Anxiety   . Arthritis   . Cancer [facility])    Skin cancer  . Cataracts, both eyes 05/2014  . COPD (chronic obstructive pulmonary disease) (HCC)    intermittent home O2 - rarely uses  . Depression   . Diabetes mellitus without complication ([place])   . GERD (gastroesophageal reflux disease)   . Hyperkalemia   . Hyperlipidemia   . Hypertension   . Seizures ([place])    From RH Negative blood when pregnant  . Stroke ([place])   . Thrombocytosis ([place])   . Thyroid disease     Patient Active Problem List   Diagnosis Date Noted  . Thoracic aortic atherosclerosis ([place]) 02/19/2017  . Acute on chronic respiratory failure with hypoxia ([place]) 02/14/2017  . Pneumonia 02/12/2017  . Acute exacerbation of chronic obstructive pulmonary disease (COPD) ([place]) 02/12/2017  . Dysphonia 06/08/2016  .  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 Iron deficiency anemia   . Absolute anemia 04/16/2016  . 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 Pain of upper abdomen   . Sinusitis 12/10/2015  . GERD (gastroesophageal reflux disease) 12/10/2015  . Hoarseness 12/10/2015  . Diarrhea 12/10/2015  . Thrombocytosis ([place]) 12/10/2015  . Dysphagia 07/25/2015  . Otitis media, acute 07/25/2015  . 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 Anxiety   . Depression   . Diabetes mellitus without complication ([place])   . Hyperlipidemia   . Hypertension   . Hypothyroidism   . Seasonal allergic rhinitis 08/18/2013  . Asthma with COPD ([place]) 10/06/2012    Past Surgical History:  Procedure Laterality Date  . ABDOMINAL HYSTERECTOMY    . ANKLE SURGERY Right   . BREAST SURGERY     Bx, none malignant  . COLONOSCOPY  2013   normal per patient  . COLONOSCOPY N/A 05/14/2016   Procedure: COLONOSCOPY;  Surgeon: Danie Binder, MD;  Location: [facility] ENDO SUITE;  Service: Endoscopy;  Laterality: N/A;  8:30AM  . ESOPHAGOGASTRODUODENOSCOPY N/A 01/14/2016   Dr. Oneida Alar: normal esophagus, single hyperplastic gastric polyp, normal duodenum, negative H.pylori  . ROTATOR CUFF REPAIR Right   . Skin Cancers    . TONSILLECTOMY    . TONSILLECTOMY      OB History    No data  available       Home Medications    Prior to Admission medications   Medication Sig Start Date End Date Taking? Authorizing Provider  albuterol (PROVENTIL) (2.5 MG/3ML) 0.083% nebulizer solution Take 3 mLs (2.5 mg total) by nebulization every 6 (six) hours as needed for wheezing or shortness of breath. 02/14/17   Dhungel, Nishant, MD  albuterol (VENTOLIN HFA) 108 (90 Base) MCG/ACT inhaler Inhale 2 puffs into the lungs every 4 (four) hours as needed for wheezing or shortness of breath. 02/12/17   Terald Sleeper, PA-C  aspirin EC 81 MG tablet Take 81 mg by mouth daily.    [provider]  fexofenadine-pseudoephedrine (ALLEGRA-D 24)  180-240 MG 24 hr tablet Take 1 tablet by mouth daily as needed (allergies).    [provider]  fluticasone furoate-vilanterol (BREO ELLIPTA) 100-25 MCG/INH AEPB Inhale 1 puff into the lungs daily. 02/19/17   Janora Norlander, DO  gabapentin (NEURONTIN) 300 MG capsule TAKE 1 CAPSULE BY MOUTH EVERYDAY AT BEDTIME 03/30/17   Terald Sleeper, PA-C  gabapentin (NEURONTIN) 300 MG capsule TAKE 1 CAPSULE BY MOUTH EVERYDAY AT BEDTIME 04/06/17   Terald Sleeper, PA-C  guaiFENesin (MUCINEX) 600 MG 12 hr tablet Take 1 tablet (600 mg total) by mouth 2 (two) times daily. 02/14/17   Dhungel, Nishant, MD  hydroxyurea (HYDREA) 500 MG capsule TAKE 2 CAPSULES ON MON-TUES-WED-FRI-SAT. Take 1 po on thurs and sunday 01/09/17   Twana First, MD  ipratropium (ATROVENT) 0.02 % nebulizer solution Take 2.5 mLs (0.5 mg total) by nebulization every 6 (six) hours as needed for wheezing or shortness of breath. 02/14/17   Dhungel, Flonnie Overman, MD  levofloxacin (LEVAQUIN) 750 MG tablet Take 1 tablet (750 mg total) by mouth daily. 04/10/17   Timmothy Euler, MD  levothyroxine (SYNTHROID, LEVOTHROID) 112 MCG tablet TAKE 1 TABLET BY MOUTH EVERY DAY 02/06/17   Chipper Herb, MD  LORazepam (ATIVAN) 0.5 MG tablet Take 1 tablet (0.5 mg total) by mouth at bedtime as needed for anxiety or sleep. 02/19/17   Ronnie Doss M, DO  metFORMIN (GLUCOPHAGE) 500 MG tablet TAKE 1 TABLET (500 MG TOTAL) BY MOUTH 2 (TWO) TIMES DAILY WITH A MEAL. 02/24/17   Terald Sleeper, PA-C  metoprolol succinate (TOPROL-XL) 25 MG 24 hr tablet TAKE 1 TABLET BY MOUTH EVERY DAY 02/02/17   Ronnie Doss M, DO  Multiple Vitamins-Minerals (ICAPS AREDS 2 PO) Take 1 capsule by mouth 2 (two) times daily.    [provider]  omeprazole (PRILOSEC) 20 MG capsule TAKE ONE CAPSULE BY MOUTH EVERY MORNING 30 MINUTES PRIOR TO YOUR FIRST MEAL 02/04/17   Mahala Menghini, PA-C  PARoxetine (PAXIL) 20 MG tablet TAKE 1 TABLET BY MOUTH EVERY DAY 04/06/17   Terald Sleeper,  PA-C  predniSONE (DELTASONE) 20 MG tablet Take 2 tablets (40 mg total) by mouth daily with breakfast. 04/10/17   Timmothy Euler, MD  promethazine (PHENERGAN) 12.5 MG tablet Take 1 tablet (12.5 mg total) by mouth every 8 (eight) hours as needed for nausea or vomiting. 02/19/17   Ronnie Doss M, DO  tiotropium (SPIRIVA HANDIHALER) 18 MCG inhalation capsule Place 1 capsule (18 mcg total) into inhaler and inhale daily. 02/14/17 02/14/18  Dhungel, Flonnie Overman, MD  traZODone (DESYREL) 100 MG tablet Take 0.5-1 tablets (50-100 mg total) by mouth at bedtime. 04/10/17   Timmothy Euler, MD    Family History Family History  Problem Relation Age of Onset  . Alzheimer's disease Mother   .  Arthritis Father   . Stroke Maternal Aunt     Social History Social History   Tobacco Use  . Smoking status: Former Smoker    Packs/day: 3.00    Years: 36.00    Pack years: 108.00    Types: Cigarettes    Last attempt to quit: 03/10/2000    Years since quitting: 17.1  . Smokeless tobacco: Never Used  Substance Use Topics  . Alcohol use: No  . Drug use: No     Allergies   Bee venom; Codeine; Lipitor [atorvastatin]; Penicillins; Demeclocycline; Morphine and related; and Tetracyclines & related   Review of Systems Review of Systems  All other systems reviewed and are negative.    Physical Exam Updated Vital Signs BP (!) 107/50   Pulse (!) 108   Temp 98.3 F (36.8 C) (Oral)   Resp 19   Ht 5\' 6"  (1.676 m)   Wt 78.5 kg (173 lb)   SpO2 100%   BMI 27.92 kg/m   Physical Exam  Constitutional: She appears well-developed and well-nourished. No distress.  HENT:  Head: Normocephalic and atraumatic.  Mouth/Throat: Oropharynx is clear and moist. No oropharyngeal exudate.  Eyes: Conjunctivae and EOM are normal. Pupils are equal, round, and reactive to light. Right eye exhibits no discharge. Left eye exhibits no discharge. No scleral icterus.  Neck: Normal range of motion. Neck supple. No JVD present.  No thyromegaly present.  Cardiovascular: Normal rate, regular rhythm, normal heart sounds and intact distal pulses. Exam reveals no gallop and no friction rub.  No murmur heard. Pulmonary/Chest: She is in respiratory distress. She has wheezes. She has no rales.  The patient appears short of breath, tachypneic, in moderate respiratory distress, wheezing is diffusely present, no rales, accessory muscle use present  Abdominal: Soft. Bowel sounds are normal. She exhibits no distension and no mass. There is no tenderness.  Musculoskeletal: Normal range of motion. She exhibits no edema or tenderness.  Lymphadenopathy:    She has no cervical adenopathy.  Neurological: She is alert. Coordination normal.  Skin: Skin is warm and dry. No rash noted. No erythema.  Psychiatric: She has a normal mood and affect. Her behavior is normal.  Nursing note and vitals reviewed.    ED Treatments / Results  Labs (all labs ordered are listed, but only abnormal results are displayed) Labs Reviewed  COMPREHENSIVE METABOLIC PANEL - Abnormal; Notable for the following components:      Result Value   Chloride 99 (*)    Glucose, Bld 131 (*)    Creatinine, Ser 1.02 (*)    ALT 9 (*)    Total Bilirubin 1.4 (*)    GFR calc non Af Amer 52 (*)    GFR calc Af Amer 60 (*)    All other components within normal limits  CBC WITH DIFFERENTIAL/PLATELET - Abnormal; Notable for the following components:   RBC 3.68 (*)    MCV 111.1 (*)    MCH 35.6 (*)    Platelets 414 (*)    All other components within normal limits  CULTURE, BLOOD (ROUTINE X 2)  CULTURE, BLOOD (ROUTINE X 2)  TROPONIN I  URINALYSIS, ROUTINE W REFLEX MICROSCOPIC  I-STAT CG4 LACTIC ACID, ED    EKG  EKG Interpretation None       Radiology Dg Neck Soft Tissue  Result Date: 04/12/2017 CLINICAL DATA:  Pharyngitis for the past week. EXAM: NECK SOFT TISSUES - 1+ VIEW COMPARISON:  None. FINDINGS: Normal appearing airway and epiglottis. No prevertebral  soft tissue swelling. Extensive cervical spine degenerative changes with mild reversal of the normal cervical lordosis. The patient is edentulous. Prominent interstitial markings in the visualized lungs. This appears chronic on a portable chest earlier today. IMPRESSION: No acute abnormality. 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 Electronically Signed   By: Claudie Revering M.D.   On: 04/12/2017 15:05   Dg Chest Port 1 View  Result Date: 04/12/2017 CLINICAL DATA:  Cough, shortness of breath EXAM: PORTABLE CHEST 1 VIEW COMPARISON:  02/12/2017 FINDINGS: Lungs are essentially clear. Prominent pulmonary markings along the medial right heart border. No focal consolidation. No pleural effusion or pneumothorax. The heart is normal in size. IMPRESSION: No evidence of acute cardiopulmonary disease. Electronically Signed   By: Julian Hy M.D.   On: 04/12/2017 13:03    Procedures Procedures (including critical care time)  Medications Ordered in ED Medications  albuterol (PROVENTIL,VENTOLIN) solution continuous neb (0 mg/hr Nebulization Stopped 04/12/17 1440)  0.9 %  sodium chloride infusion (1,000 mLs Intravenous New Bag/Given 04/12/17 1306)  methylPREDNISolone sodium succinate (SOLU-MEDROL) 125 mg/2 mL injection 125 mg (125 mg Intravenous Given 04/12/17 1305)  aztreonam (AZACTAM) 2 g in dextrose 5 % 50 mL IVPB (0 g Intravenous Stopped 04/12/17 1344)  vancomycin (VANCOCIN) 1,500 mg in sodium chloride 0.9 % 500 mL IVPB (1,500 mg Intravenous New Bag/Given 04/12/17 1306)  ondansetron (ZOFRAN) injection 4 mg (4 mg Intravenous Given 04/12/17 1323)     Initial Impression / Assessment and Plan / ED Course  I have reviewed the triage vital signs and the nursing notes.  Pertinent labs & imaging results that were available during my care of the patient were reviewed by me and considered in my medical decision making (see chart for details).    The patient may very well be  septic from a progressive pulmonary illness.  She does not appear tachycardic hypoxic or hypotensive but with her increased work of breathing it suggestive of an underlying pneumonia, pneumothorax or worsening obstructive pulmonary disease.  Continuous nebulizer ordered, labs, x-ray, Solu-Medrol  The patient was given medications, I have personally viewed her 2 view PA and lateral chest x-ray and there does not appear to be any signs of infiltrate.  The patient has improved slightly but is still dyspneic and short of breath.  Lateral soft tissue images of the neck show no signs of laryngeal obstruction  Discussed with the hospitalist who is been kind enough to admit to the hospital.  Final Clinical Impressions(s) / ED Diagnoses   Final diagnoses:  Respiratory distress  COPD exacerbation (HCC)      Noemi Chapel, MD 04/12/17 [phone]

## 2017-04-12 NOTE — ED Triage Notes (Addendum)
Patient c/o shortness of breath with cough and fevers at night x1 week. Per patient thick green sputum. Reports chest soreness from cough  and sore throat. Denies any chest pain. Per patient hx of COPD and wears 2 liters of O2 at all times. Patient seen by PCP on 2/1 and tested for flu-negative. Patient denies having chest x-ray. Given antibiotic but unsure of the name. Chart indicates Levaquin and Prednisone given and 2/1-per patient increasingly getting worse.

## 2017-04-12 NOTE — H&P (Signed)
History and Physical    Jordan [name] UMP:[id number] DOB: January 05, 1941 DOA: 04/12/2017  PCP: Terald Sleeper, PA-C   Patient coming from: Home  Chief Complaint: SOB, Cough.  HPI: Jordan [name] is a 77 y.o. female with medical history significant for -COPD, asthma , DM 2, HTN, myeloproliferative disease-thrombocytosis, anxiety.  Patient presented to the ED with complaints of shortness of breath and increased cough of one week duration.  Cough is productive of greenish sputum, with associated wheezing.  Patient reports subjective fevers chills and sweats at night. Patient also reports sore throat, runny nose initially before shortness of breath started. Patient is on 2 L O2 at home.  Patient saw her PCP 2/1, for these symptoms , flu check was negative, was prescribed Levaquin and prednisone, she has completed 2 days of antibiotics, presented to the ED with worsening symptoms.   ED Course: Stable vitals, O2 sats greater than 97% on 2 L.  Chest x-ray negative for acute abnormality.  Creatinine mildly elevated at 1.02.  WBC normal at 9.  Lactic acid 1.8.Due to initial concern for possible sepsis or infectious etiology, code sepsis was called, blood cultures were drawn and was started on broad-spectrum antibiotics vancomycin and aztreonam.  Patient was also given 125 mg of Solu-Medrol  Review of Systems:  Patient reports neck pain that started yesterday, patient denies chest pain, no headache, neck stiffness or photophobia, no diarrhea vomiting.  Patient endorses nausea, and poor p.o. intake over the past few days. Otherwise review of systems negative.  Past Medical History:  Diagnosis Date  . Anxiety   . Arthritis   . Cancer [facility])    Skin cancer  . Cataracts, both eyes 05/2014  . COPD (chronic obstructive pulmonary disease) (HCC)    intermittent home O2 - rarely uses  . Depression   . Diabetes mellitus without complication ([place])   . GERD (gastroesophageal reflux disease)   . Hyperkalemia   .  Hyperlipidemia   . Hypertension   . Seizures ([place])    From RH Negative blood when pregnant  . Stroke ([place])   . Thrombocytosis ([place])   . Thyroid disease     Past Surgical History:  Procedure Laterality Date  . ABDOMINAL HYSTERECTOMY    . ANKLE SURGERY Right   . BREAST SURGERY     Bx, none malignant  . COLONOSCOPY  2013   normal per patient  . COLONOSCOPY N/A 05/14/2016   Procedure: COLONOSCOPY;  Surgeon: Danie Binder, MD;  Location: [facility];  Service: Endoscopy;  Laterality: N/A;  8:30AM  . ESOPHAGOGASTRODUODENOSCOPY N/A 01/14/2016   Dr. Oneida Alar: normal esophagus, single hyperplastic gastric polyp, normal duodenum, negative H.pylori  . ROTATOR CUFF REPAIR Right   . Skin Cancers    . TONSILLECTOMY    . TONSILLECTOMY      reports that she quit smoking about 17 years ago. Her smoking use included cigarettes. She has a 108.00 pack-year smoking history. she has never used smokeless tobacco. She reports that she does not drink alcohol or use drugs.  Allergies  Allergen Reactions  . Bee Venom Anaphylaxis  . Codeine Itching  . Lipitor [Atorvastatin]     Extreme Dry Mouth, joint pain  . Penicillins Hives    Has patient had a PCN reaction causing immediate rash, facial/tongue/throat swelling, SOB or lightheadedness with hypotension: No Has patient had a PCN reaction causing severe rash involving mucus membranes or skin necrosis: Yes Has patient had a PCN reaction that required hospitalization: No Has patient  had a PCN reaction occurring within the last 10 years: No If all of the above answers are "NO", then may proceed with Cephalosporin use.   . Demeclocycline Rash  . Morphine And Related Rash  . Tetracyclines & Related Rash    Family History  Problem Relation Age of Onset  . Alzheimer's disease Mother   . Arthritis Father   . Stroke Maternal Aunt     Prior to Admission medications   Medication Sig Start Date End Date Taking? Authorizing Provider  albuterol  (PROVENTIL) (2.5 MG/3ML) 0.083% nebulizer solution Take 3 mLs (2.5 mg total) by nebulization every 6 (six) hours as needed for wheezing or shortness of breath. 02/14/17   Dhungel, Nishant, MD  albuterol (VENTOLIN HFA) 108 (90 Base) MCG/ACT inhaler Inhale 2 puffs into the lungs every 4 (four) hours as needed for wheezing or shortness of breath. 02/12/17   Terald Sleeper, PA-C  aspirin EC 81 MG tablet Take 81 mg by mouth daily.    [provider]  fexofenadine-pseudoephedrine (ALLEGRA-D 24) 180-240 MG 24 hr tablet Take 1 tablet by mouth daily as needed (allergies).    [provider]  fluticasone furoate-vilanterol (BREO ELLIPTA) 100-25 MCG/INH AEPB Inhale 1 puff into the lungs daily. 02/19/17   Janora Norlander, DO  gabapentin (NEURONTIN) 300 MG capsule TAKE 1 CAPSULE BY MOUTH EVERYDAY AT BEDTIME 03/30/17   Terald Sleeper, PA-C  gabapentin (NEURONTIN) 300 MG capsule TAKE 1 CAPSULE BY MOUTH EVERYDAY AT BEDTIME 04/06/17   Terald Sleeper, PA-C  guaiFENesin (MUCINEX) 600 MG 12 hr tablet Take 1 tablet (600 mg total) by mouth 2 (two) times daily. 02/14/17   Dhungel, Nishant, MD  hydroxyurea (HYDREA) 500 MG capsule TAKE 2 CAPSULES ON MON-TUES-WED-FRI-SAT. Take 1 po on thurs and sunday 01/09/17   Twana First, MD  ipratropium (ATROVENT) 0.02 % nebulizer solution Take 2.5 mLs (0.5 mg total) by nebulization every 6 (six) hours as needed for wheezing or shortness of breath. 02/14/17   Dhungel, Flonnie Overman, MD  levofloxacin (LEVAQUIN) 750 MG tablet Take 1 tablet (750 mg total) by mouth daily. 04/10/17   Timmothy Euler, MD  levothyroxine (SYNTHROID, LEVOTHROID) 112 MCG tablet TAKE 1 TABLET BY MOUTH EVERY DAY 02/06/17   Chipper Herb, MD  LORazepam (ATIVAN) 0.5 MG tablet Take 1 tablet (0.5 mg total) by mouth at bedtime as needed for anxiety or sleep. 02/19/17   Ronnie Doss M, DO  metFORMIN (GLUCOPHAGE) 500 MG tablet TAKE 1 TABLET (500 MG TOTAL) BY MOUTH 2 (TWO) TIMES DAILY WITH A MEAL. 02/24/17    Terald Sleeper, PA-C  metoprolol succinate (TOPROL-XL) 25 MG 24 hr tablet TAKE 1 TABLET BY MOUTH EVERY DAY 02/02/17   Ronnie Doss M, DO  Multiple Vitamins-Minerals (ICAPS AREDS 2 PO) Take 1 capsule by mouth 2 (two) times daily.    [provider]  omeprazole (PRILOSEC) 20 MG capsule TAKE ONE CAPSULE BY MOUTH EVERY MORNING 30 MINUTES PRIOR TO YOUR FIRST MEAL 02/04/17   Mahala Menghini, PA-C  PARoxetine (PAXIL) 20 MG tablet TAKE 1 TABLET BY MOUTH EVERY DAY 04/06/17   Terald Sleeper, PA-C  predniSONE (DELTASONE) 20 MG tablet Take 2 tablets (40 mg total) by mouth daily with breakfast. 04/10/17   Timmothy Euler, MD  promethazine (PHENERGAN) 12.5 MG tablet Take 1 tablet (12.5 mg total) by mouth every 8 (eight) hours as needed for nausea or vomiting. 02/19/17   Ronnie Doss M, DO  tiotropium (SPIRIVA HANDIHALER) 18 MCG  inhalation capsule Place 1 capsule (18 mcg total) into inhaler and inhale daily. 02/14/17 02/14/18  Dhungel, Flonnie Overman, MD  traZODone (DESYREL) 100 MG tablet Take 0.5-1 tablets (50-100 mg total) by mouth at bedtime. 04/10/17   Timmothy Euler, MD    Physical Exam: Vitals:   04/12/17 1300 04/12/17 1330 04/12/17 1400 04/12/17 1500  BP: (!) 122/52 100/64 120/63 (!) 107/50  Pulse: 79  (!) 107 (!) 108  Resp: 10 12 10 19   Temp:      TempSrc:      SpO2: 100%  100% 100%  Weight:      Height:        Constitutional: Mild respiratory distress, appears anxious, with mild tremors Vitals:   04/12/17 1300 04/12/17 1330 04/12/17 1400 04/12/17 1500  BP: (!) 122/52 100/64 120/63 (!) 107/50  Pulse: 79  (!) 107 (!) 108  Resp: 10 12 10 19   Temp:      TempSrc:      SpO2: 100%  100% 100%  Weight:      Height:       Eyes: PERRL, lids and conjunctivae normal ENMT: Mucous membranes are  Dry. Posterior pharynx clear of any exudate or lesions. Neck: normal, supple, no masses, no thyromegaly Respiratory: Diffuse expiratory rhonchi.  cardiovascular: Regular rate and rhythm, no  murmurs / rubs / gallops. No extremity edema. 2+ pedal pulses.  Abdomen: no tenderness, no masses palpated. No hepatosplenomegaly. Bowel sounds positive.  Musculoskeletal: no clubbing / cyanosis. No joint deformity upper and lower extremities. Good ROM, no contractures. Normal muscle tone.  Skin: no rashes, lesions, ulcers. No induration Neurologic: CN 2-12 grossly intact. Sensation intact, Strength 5/5 in all 4.  Psychiatric: Normal judgment and insight. Alert and oriented x 3. Normal mood, but appears anxious.  Labs on Admission: I have personally reviewed following labs and imaging studies  CBC: Recent Labs  Lab 04/12/17 1203  WBC 9.0  NEUTROABS 6.6  HGB 13.1  HCT 40.9  MCV 111.1*  PLT 469*   Basic Metabolic Panel: Recent Labs  Lab 04/12/17 1203  NA 136  K 3.8  CL 99*  CO2 23  GLUCOSE 131*  BUN 15  CREATININE 1.02*  CALCIUM 9.1   GFR: Estimated Creatinine Clearance: 48.9 mL/min (A) (by C-G formula based on SCr of 1.02 mg/dL (H)). Liver Function Tests: Recent Labs  Lab 04/12/17 1203  AST 18  ALT 9*  ALKPHOS 75  BILITOT 1.4*  PROT 7.4  ALBUMIN 4.0   Cardiac Enzymes: Recent Labs  Lab 04/12/17 1203  TROPONINI <0.03    Radiological Exams on Admission: Dg Neck Soft Tissue  Result Date: 04/12/2017 CLINICAL DATA:  Pharyngitis for the past week. EXAM: NECK SOFT TISSUES - 1+ VIEW COMPARISON:  None. FINDINGS: Normal appearing airway and epiglottis. No prevertebral soft tissue swelling. Extensive cervical spine degenerative changes with mild reversal of the normal cervical lordosis. The patient is edentulous. Prominent interstitial markings in the visualized lungs. This appears chronic on a portable chest earlier today. IMPRESSION: No acute abnormality. 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 Electronically Signed   By: Claudie Revering M.D.   On: 04/12/2017 15:05   Dg Chest Port 1 View  Result Date: 04/12/2017 CLINICAL DATA:   Cough, shortness of breath EXAM: PORTABLE CHEST 1 VIEW COMPARISON:  02/12/2017 FINDINGS: Lungs are essentially clear. Prominent pulmonary markings along the medial right heart border. No focal consolidation. No pleural effusion or pneumothorax. The heart is normal in size. IMPRESSION: No  evidence of acute cardiopulmonary disease. Electronically Signed   By: Julian Hy M.D.   On: 04/12/2017 13:03    XID:[id number].  Assessment/Plan Principal Problem:   COPD exacerbation (HCC) Active Problems:   Asthma with COPD ([place])   Anxiety   Diabetes mellitus without complication ([place])   Hypertension   Hypothyroidism   Thrombocytosis ([place])  COPD exacerbation-productive cough, SOB, subjective fevers.  Likely provoked by initial viral infection.  Presently maintaining O2 sats > 97% on home 2 L [place].  Still with rhonchi on exam.  WBC 9.  Chest x-ray negative for acute abnormality.  Flu check 2/1- negative. -Repeat chest x-ray a.m. -Respiratory virus panel -Azithromycin p.o. -IV Solu-Medrol 40 twice daily -Duo nebs every 6 hourly scheduled -Mucolytics -Will hold off on broad-spectrum antibiotics- Vanc and aztreonam at this time as no evidence of infection, times likely due to COPD exacerbation.  - But follow-up blood cultures drawn in ED -Continue home steroid inhalers  Mild AKI-creatinine 1.02.  Baseline 0.7- 0.8.  Recent p.o. Intake. -Hydrate Ns+20 KCL X 12 hrs - BMP a.m  Anxiety, depression-history of.  With tremors, likely contributing to SOB.  Reports Ativan on home medication was prescribed years ago she is not currently taking it. - Xanax 0.5mg  BID PRN -New home Paxil  DM-Home medications Metformin. - SSI -Monitor CBGs while on steroids, reduce steroid dose with clinical improvement  Myeloprolif disease- thrombocytosis- platelets 331.  Patient on hydroxyurea, 500 mg Sunday and Thursday, 2 tabs 1000mg - other days. -Cont home hydroxyurea  DVT prophylaxis: Lovenox Code Status: Full  code. Previously DNR, pt rescinded this today. Family Communication: Spouse at bedside Disposition Plan: Home, 2 days Consults called: None Admission status: Obs   Bethena Roys MD [facility] Pager [phone]  If 11PM-7AM, please contact night-coverage [email or web address] Password [facility]  04/12/2017, 3:57 PM

## 2017-04-12 NOTE — ED Notes (Signed)
RT paged for neb tx.

## 2017-04-12 NOTE — ED Notes (Signed)
Phlebotomy at bedside.

## 2017-04-12 NOTE — Progress Notes (Signed)
Pharmacy Antibiotic Note  Jordan [name] is a 77 y.o. female admitted on 04/12/2017 with pneumonia.  Pharmacy has been consulted for vancomycin and aztreonam dosing. Vancomycin 1500 mg and aztreonam 2gm ordered in the ED  Plan: Cont vancomycin 1gm IV q24 hours Cont aztreonam 1 gm IV q8 hours F/u renal function, cultures and clinical course  Height: 5\' 6"  (167.6 cm) Weight: 173 lb (78.5 kg) IBW/kg (Calculated) : 59.3  Temp (24hrs), Avg:98.3 F (36.8 C), Min:98.3 F (36.8 C), Max:98.3 F (36.8 C)  Recent Labs  Lab 04/12/17 1203  CREATININE 1.02*    Estimated Creatinine Clearance: 48.9 mL/min (A) (by C-G formula based on SCr of 1.02 mg/dL (H)).    Allergies  Allergen Reactions  . Bee Venom Anaphylaxis  . Codeine Itching  . Lipitor [Atorvastatin]     Extreme Dry Mouth, joint pain  . Penicillins Hives    Has patient had a PCN reaction causing immediate rash, facial/tongue/throat swelling, SOB or lightheadedness with hypotension: No Has patient had a PCN reaction causing severe rash involving mucus membranes or skin necrosis: Yes Has patient had a PCN reaction that required hospitalization: No Has patient had a PCN reaction occurring within the last 10 years: No If all of the above answers are "NO", then may proceed with Cephalosporin use.   . Demeclocycline Rash  . Morphine And Related Rash  . Tetracyclines & Related Rash    Thank you for allowing pharmacy to be a part of this patient's care.  Excell Seltzer Poteet 04/12/2017 12:40 PM

## 2017-04-12 NOTE — ED Notes (Signed)
Nausea relieved  

## 2017-04-12 NOTE — ED Notes (Signed)
Pt transported to xray 

## 2017-04-13 ENCOUNTER — Encounter (HOSPITAL_COMMUNITY): Payer: Self-pay | Admitting: *Deleted

## 2017-04-13 ENCOUNTER — Observation Stay (HOSPITAL_COMMUNITY): Payer: Medicare Other

## 2017-04-13 DIAGNOSIS — R0602 Shortness of breath: Secondary | ICD-10-CM | POA: Diagnosis not present

## 2017-04-13 DIAGNOSIS — I10 Essential (primary) hypertension: Secondary | ICD-10-CM | POA: Diagnosis not present

## 2017-04-13 DIAGNOSIS — E039 Hypothyroidism, unspecified: Secondary | ICD-10-CM | POA: Diagnosis not present

## 2017-04-13 DIAGNOSIS — J449 Chronic obstructive pulmonary disease, unspecified: Secondary | ICD-10-CM | POA: Diagnosis not present

## 2017-04-13 DIAGNOSIS — J441 Chronic obstructive pulmonary disease with (acute) exacerbation: Secondary | ICD-10-CM | POA: Diagnosis not present

## 2017-04-13 LAB — RESPIRATORY PANEL BY PCR
ADENOVIRUS-RVPPCR: NOT DETECTED
Bordetella pertussis: NOT DETECTED
CORONAVIRUS 229E-RVPPCR: NOT DETECTED
CORONAVIRUS NL63-RVPPCR: NOT DETECTED
CORONAVIRUS OC43-RVPPCR: NOT DETECTED
Chlamydophila pneumoniae: NOT DETECTED
Coronavirus HKU1: NOT DETECTED
Influenza A: NOT DETECTED
Influenza B: NOT DETECTED
MYCOPLASMA PNEUMONIAE-RVPPCR: NOT DETECTED
Metapneumovirus: NOT DETECTED
PARAINFLUENZA VIRUS 1-RVPPCR: NOT DETECTED
Parainfluenza Virus 2: NOT DETECTED
Parainfluenza Virus 3: NOT DETECTED
Parainfluenza Virus 4: NOT DETECTED
Respiratory Syncytial Virus: NOT DETECTED
Rhinovirus / Enterovirus: NOT DETECTED

## 2017-04-13 LAB — BASIC METABOLIC PANEL
ANION GAP: 12 (ref 5–15)
BUN: 13 mg/dL (ref 6–20)
CO2: 23 mmol/L (ref 22–32)
Calcium: 8.7 mg/dL — ABNORMAL LOW (ref 8.9–10.3)
Chloride: 104 mmol/L (ref 101–111)
Creatinine, Ser: 0.83 mg/dL (ref 0.44–1.00)
GLUCOSE: 209 mg/dL — AB (ref 65–99)
POTASSIUM: 3.9 mmol/L (ref 3.5–5.1)
Sodium: 139 mmol/L (ref 135–145)

## 2017-04-13 LAB — GLUCOSE, CAPILLARY
GLUCOSE-CAPILLARY: 182 mg/dL — AB (ref 65–99)
GLUCOSE-CAPILLARY: 140 mg/dL — AB (ref 65–99)
GLUCOSE-CAPILLARY: 190 mg/dL — AB (ref 65–99)
Glucose-Capillary: 188 mg/dL — ABNORMAL HIGH (ref 65–99)

## 2017-04-13 MED ORDER — IPRATROPIUM-ALBUTEROL 0.5-2.5 (3) MG/3ML IN SOLN
3.0000 mL | RESPIRATORY_TRACT | Status: DC
  Administered 2017-04-13 – 2017-04-14 (×9): 3 mL via RESPIRATORY_TRACT
  Filled 2017-04-13 (×9): qty 3

## 2017-04-13 MED ORDER — ORAL CARE MOUTH RINSE
15.0000 mL | Freq: Two times a day (BID) | OROMUCOSAL | Status: DC
  Administered 2017-04-13 – 2017-04-17 (×7): 15 mL via OROMUCOSAL

## 2017-04-13 NOTE — Progress Notes (Signed)
PROGRESS NOTE    Jordan [name]  BTD:[id number]  DOB: March 08, 1941  DOA: 04/12/2017 PCP: Terald Sleeper, PA-C   Brief Admission Hx: Jordan [name] is a 77 y.o. female with medical history significant for -COPD, asthma , DM 2, HTN, myeloproliferative disease-thrombocytosis, anxiety.  Patient presented to the ED with complaints of shortness of breath and increased cough of one week duration.  Cough is productive of greenish sputum, with associated wheezing.  Patient reports subjective fevers chills and sweats at night. Patient also reports sore throat, runny nose initially before shortness of breath started.  Patient is on 2 L O2 at home.  Patient saw her PCP 2/1, for these symptoms , flu check was negative, was prescribed Levaquin and prednisone, she has completed 2 days of antibiotics, presented to the ED with worsening symptoms.   MDM/Assessment & Plan:   1. Acute COPD Exacerbation - Pt clinically starting to improve, continue current treatments.  2. Mild AKI - prerenal - resolved with IVF hydration.  3. Anxiety/Depression - anxiolytics, paroxetine.  4. Type 2 DM - sliding scale coverage in hospital, titrate, possibly add prandial coverage if needed.  5. Myeloproliferative Disease - resume home hydrea.    DVT prophylaxis: lovenox Code Status: full  Family Communication: spouse Disposition Plan: home in 1-2 days   Subjective: Pt awake, alert, says she is starting to feel better, noticing improvement.   Objective: Vitals:   04/13/17 0209 04/13/17 0500 04/13/17 0735 04/13/17 0741  BP:  (!) 131/47    Pulse:  75    Resp:  18    Temp:  98.3 F (36.8 C)    TempSrc:  Oral    SpO2: 96% 100% 93% 96%  Weight:      Height:        Intake/Output Summary (Last 24 hours) at 04/13/2017 1013 Last data filed at 04/13/2017 0400 Gross per 24 hour  Intake 968.33 ml  Output -  Net 968.33 ml   Filed Weights   04/12/17 1156 04/12/17 1819  Weight: 78.5 kg (173 lb) 78.2 kg (172 lb 6.4 oz)     REVIEW  OF SYSTEMS  As per history otherwise all reviewed and reported negative  Exam:  General exam: awake, alert, NAD, cooperative.  Respiratory system: diffuse exp wheezing. No increased work of breathing. Cardiovascular system: S1 & S2 heard, RRR. No JVD, murmurs, gallops, clicks or pedal edema. Gastrointestinal system: Abdomen is nondistended, soft and nontender. Normal bowel sounds heard. Central nervous system: Alert and oriented. No focal neurological deficits. Extremities: no CCE.  Data Reviewed: Basic Metabolic Panel: Recent Labs  Lab 04/12/17 1203 04/13/17 0551  NA 136 139  K 3.8 3.9  CL 99* 104  CO2 23 23  GLUCOSE 131* 209*  BUN 15 13  CREATININE 1.02* 0.83  CALCIUM 9.1 8.7*   Liver Function Tests: Recent Labs  Lab 04/12/17 1203  AST 18  ALT 9*  ALKPHOS 75  BILITOT 1.4*  PROT 7.4  ALBUMIN 4.0   No results for input(s): LIPASE, AMYLASE in the last 168 hours. No results for input(s): AMMONIA in the last 168 hours. CBC: Recent Labs  Lab 04/12/17 1203  WBC 9.0  NEUTROABS 6.6  HGB 13.1  HCT 40.9  MCV 111.1*  PLT 414*   Cardiac Enzymes: Recent Labs  Lab 04/12/17 1203  TROPONINI <0.03   CBG (last 3)  Recent Labs    04/12/17 2206 04/13/17 0815  GLUCAP 388* 188*   Recent Results (from the past 240 hour(s))  Blood Culture (routine x 2)     Status: None (Preliminary result)   Collection Time: 04/12/17 12:15 PM  Result Value Ref Range Status   Specimen Description BLOOD LEFT HAND  Final   Special Requests   Final    BOTTLES DRAWN AEROBIC AND ANAEROBIC Blood Culture adequate volume   Culture   Final    NO GROWTH < 24 HOURS Performed at [facility], [address], [place], [place] [address]    Report Status PENDING  Incomplete  Blood Culture (routine x 2)     Status: None (Preliminary result)   Collection Time: 04/12/17 12:37 PM  Result Value Ref Range Status   Specimen Description BLOOD RIGHT HAND  Final   Special Requests   Final    BOTTLES  DRAWN AEROBIC AND ANAEROBIC Blood Culture adequate volume   Culture   Final    NO GROWTH < 24 HOURS Performed at [facility], [address] [place], [place] [address]    Report Status PENDING  Incomplete     Studies: Dg Neck Soft Tissue  Result Date: 04/12/2017 CLINICAL DATA:  Pharyngitis for the past week. EXAM: NECK SOFT TISSUES - 1+ VIEW COMPARISON:  None. FINDINGS: Normal appearing airway and epiglottis. No prevertebral soft tissue swelling. Extensive cervical spine degenerative changes with mild reversal of the normal cervical lordosis. The patient is edentulous. Prominent interstitial markings in the visualized lungs. This appears chronic on a portable chest earlier today. IMPRESSION: No acute abnormality. 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 Electronically Signed   By: Claudie Revering M.D.   On: 04/12/2017 15:05   Dg Chest Port 1 View  Result Date: 04/12/2017 CLINICAL DATA:  Cough, shortness of breath EXAM: PORTABLE CHEST 1 VIEW COMPARISON:  02/12/2017 FINDINGS: Lungs are essentially clear. Prominent pulmonary markings along the medial right heart border. No focal consolidation. No pleural effusion or pneumothorax. The heart is normal in size. IMPRESSION: No evidence of acute cardiopulmonary disease. Electronically Signed   By: Julian Hy M.D.   On: 04/12/2017 13:03     Scheduled Meds: . aspirin EC  81 mg Oral Daily  . azithromycin  500 mg Oral Daily  . budesonide (PULMICORT) nebulizer solution  0.25 mg Nebulization BID  . enoxaparin (LOVENOX) injection  40 mg Subcutaneous Q24H  . gabapentin  300 mg Oral QHS  . guaiFENesin  600 mg Oral BID  . hydroxyurea  1,000 mg Oral Once per day on Mon Tue Wed Fri Sat  . [START ON 04/16/2017] hydroxyurea  500 mg Oral Once per day on Sun Thu  . insulin aspart  0-9 Units Subcutaneous TID WC  . ipratropium-albuterol  3 mL Nebulization Q4H  . levothyroxine  112 mcg Oral QAC breakfast  . mouth  rinse  15 mL Mouth Rinse BID  . methylPREDNISolone (SOLU-MEDROL) injection  40 mg Intravenous Q12H  . metoprolol succinate  25 mg Oral Daily  . pantoprazole  40 mg Oral Daily  . PARoxetine  20 mg Oral Daily  . traZODone  100 mg Oral QHS   Continuous Infusions: . aztreonam Stopped (04/13/17 0727)  . vancomycin      Principal Problem:   COPD exacerbation (HCC) Active Problems:   Asthma with COPD ([place])   Anxiety   Diabetes mellitus without complication ([place])   Hypertension   Hypothyroidism   Thrombocytosis ([place])   Time spent:   Irwin Brakeman, MD, FAAFP [facility] Pager [phone]  If 7PM-7AM, please contact night-coverage [email or web address]  Password [email or web address] 04/13/2017, 10:13 AM    LOS: 0 days

## 2017-04-13 NOTE — Progress Notes (Signed)
Patient verbalizes that she wants to change her code status to Do No Resuscitate (DNR).  Patient asked what full code status meant.  Patient verbalizes that, "I do not want to put my family through that.  I want to be a DNR again."  Dr. Wynetta Emery called from patient's room and notified.

## 2017-04-13 NOTE — Progress Notes (Signed)
Inpatient Diabetes Program Recommendations  AACE/ADA: New Consensus Statement on Inpatient Glycemic Control (2015)  Target Ranges:  Prepandial:   less than 140 mg/dL      Peak postprandial:   less than 180 mg/dL (1-2 hours)      Critically ill patients:  140 - 180 mg/dL   Results for Jordan [name], Jordan [name] (MRN [id number]) as of 04/13/2017 07:44  Ref. Range 04/12/2017 22:06  Glucose-Capillary Latest Ref Range: 65 - 99 mg/dL 388 (H)  Results for [name], Jordan (MRN [id number]) as of 04/13/2017 07:44  Ref. Range 04/12/2017 12:03 04/13/2017 05:51  Glucose Latest Ref Range: 65 - 99 mg/dL 131 (H) 209 (H)  Results for [name], Jordan (MRN [id number]) as of 04/13/2017 07:44  Ref. Range 02/12/2017 12:04  Hemoglobin A1C Latest Ref Range: 4.8 - 5.6 % 5.9 (H)   Review of Glycemic Control  Diabetes history: DM2 Outpatient Diabetes medications: Metformin 500 mg BID Current orders for Inpatient glycemic control: Novolog 0-9 units TID with meals; Solumedrol 40 mg Q12H  Inpatient Diabetes Program Recommendations: Correction (SSI): Please consider ordering Novolog 0-5 units QHS for bedtime correction scale.  Thanks, Barnie Alderman, RN, MSN, CDE Diabetes Coordinator Inpatient Diabetes Program [phone] (Team Pager from 8am to 5pm)

## 2017-04-14 DIAGNOSIS — Z87891 Personal history of nicotine dependence: Secondary | ICD-10-CM | POA: Diagnosis not present

## 2017-04-14 DIAGNOSIS — J449 Chronic obstructive pulmonary disease, unspecified: Secondary | ICD-10-CM | POA: Diagnosis not present

## 2017-04-14 DIAGNOSIS — Z8673 Personal history of transient ischemic attack (TIA), and cerebral infarction without residual deficits: Secondary | ICD-10-CM | POA: Diagnosis not present

## 2017-04-14 DIAGNOSIS — N179 Acute kidney failure, unspecified: Secondary | ICD-10-CM | POA: Diagnosis present

## 2017-04-14 DIAGNOSIS — C946 Myelodysplastic disease, not classified: Secondary | ICD-10-CM | POA: Diagnosis present

## 2017-04-14 DIAGNOSIS — E119 Type 2 diabetes mellitus without complications: Secondary | ICD-10-CM | POA: Diagnosis not present

## 2017-04-14 DIAGNOSIS — I10 Essential (primary) hypertension: Secondary | ICD-10-CM | POA: Diagnosis not present

## 2017-04-14 DIAGNOSIS — Z7951 Long term (current) use of inhaled steroids: Secondary | ICD-10-CM | POA: Diagnosis not present

## 2017-04-14 DIAGNOSIS — I700 Atherosclerosis of aorta: Secondary | ICD-10-CM | POA: Diagnosis present

## 2017-04-14 DIAGNOSIS — Z820 Family history of epilepsy and other diseases of the nervous system: Secondary | ICD-10-CM | POA: Diagnosis not present

## 2017-04-14 DIAGNOSIS — D473 Essential (hemorrhagic) thrombocythemia: Secondary | ICD-10-CM | POA: Diagnosis not present

## 2017-04-14 DIAGNOSIS — M1990 Unspecified osteoarthritis, unspecified site: Secondary | ICD-10-CM | POA: Diagnosis present

## 2017-04-14 DIAGNOSIS — Z515 Encounter for palliative care: Secondary | ICD-10-CM | POA: Diagnosis not present

## 2017-04-14 DIAGNOSIS — E785 Hyperlipidemia, unspecified: Secondary | ICD-10-CM | POA: Diagnosis present

## 2017-04-14 DIAGNOSIS — Z7982 Long term (current) use of aspirin: Secondary | ICD-10-CM | POA: Diagnosis not present

## 2017-04-14 DIAGNOSIS — J9621 Acute and chronic respiratory failure with hypoxia: Secondary | ICD-10-CM | POA: Diagnosis present

## 2017-04-14 DIAGNOSIS — J441 Chronic obstructive pulmonary disease with (acute) exacerbation: Secondary | ICD-10-CM | POA: Diagnosis not present

## 2017-04-14 DIAGNOSIS — Z8261 Family history of arthritis: Secondary | ICD-10-CM | POA: Diagnosis not present

## 2017-04-14 DIAGNOSIS — R0603 Acute respiratory distress: Secondary | ICD-10-CM | POA: Diagnosis not present

## 2017-04-14 DIAGNOSIS — F419 Anxiety disorder, unspecified: Secondary | ICD-10-CM | POA: Diagnosis not present

## 2017-04-14 DIAGNOSIS — K219 Gastro-esophageal reflux disease without esophagitis: Secondary | ICD-10-CM | POA: Diagnosis present

## 2017-04-14 DIAGNOSIS — Z85828 Personal history of other malignant neoplasm of skin: Secondary | ICD-10-CM | POA: Diagnosis not present

## 2017-04-14 DIAGNOSIS — R0602 Shortness of breath: Secondary | ICD-10-CM | POA: Diagnosis not present

## 2017-04-14 DIAGNOSIS — G609 Hereditary and idiopathic neuropathy, unspecified: Secondary | ICD-10-CM | POA: Diagnosis present

## 2017-04-14 DIAGNOSIS — Z823 Family history of stroke: Secondary | ICD-10-CM | POA: Diagnosis not present

## 2017-04-14 DIAGNOSIS — Z7984 Long term (current) use of oral hypoglycemic drugs: Secondary | ICD-10-CM | POA: Diagnosis not present

## 2017-04-14 DIAGNOSIS — H269 Unspecified cataract: Secondary | ICD-10-CM | POA: Diagnosis present

## 2017-04-14 DIAGNOSIS — E039 Hypothyroidism, unspecified: Secondary | ICD-10-CM | POA: Diagnosis not present

## 2017-04-14 DIAGNOSIS — Z9981 Dependence on supplemental oxygen: Secondary | ICD-10-CM | POA: Diagnosis not present

## 2017-04-14 LAB — GLUCOSE, CAPILLARY
GLUCOSE-CAPILLARY: 232 mg/dL — AB (ref 65–99)
Glucose-Capillary: 188 mg/dL — ABNORMAL HIGH (ref 65–99)
Glucose-Capillary: 159 mg/dL — ABNORMAL HIGH (ref 65–99)
Glucose-Capillary: 154 mg/dL — ABNORMAL HIGH (ref 65–99)

## 2017-04-14 MED ORDER — IPRATROPIUM-ALBUTEROL 0.5-2.5 (3) MG/3ML IN SOLN
3.0000 mL | Freq: Four times a day (QID) | RESPIRATORY_TRACT | Status: DC
  Administered 2017-04-15 (×3): 3 mL via RESPIRATORY_TRACT
  Filled 2017-04-14 (×3): qty 3

## 2017-04-14 MED ORDER — ALUM & MAG HYDROXIDE-SIMETH 200-200-20 MG/5ML PO SUSP
30.0000 mL | ORAL | Status: DC | PRN
  Administered 2017-04-14 (×2): 30 mL via ORAL
  Filled 2017-04-14 (×2): qty 30

## 2017-04-14 NOTE — Care Management Note (Signed)
Case Management Note  Patient Details  Name: Jordan [name] MRN: [id number] Date of Birth: January 20, 1941  Subjective/Objective:  Adm with COPD. From home with daughter and husband. Has continuous oxygen provided by [facility]. She is mostly ind with ADL's. Has cane, RW and neb machine at home. She is eligible for [facility] services. We discussed and will consider. Will deliver  [facility] flyer with information later today.          Patient is also a candidate for [facility]'s COPD pilot program. Discussed with Juliann Pulse of [facility] and attending. Patient will need pulmonology and PMT consults to be considered eligible.         Action/Plan:  CM following for needs.   Expected Discharge Date:    unk               Expected Discharge Plan:     In-House Referral:     Discharge planning Services  CM Consult  Post Acute Care Choice:    Choice offered to:     DME Arranged:    DME Agency:     HH Arranged:    HH Agency:     Status of Service:  In process, will continue to follow  If discussed at Long Length of Stay Meetings, dates discussed:    Additional Comments:  Alden Feagan, Chauncey Reading, RN 04/14/2017, 11:12 AM

## 2017-04-14 NOTE — Care Management Note (Signed)
Case Management Note  Patient Details  Name: [name] MRN: [id number] Date of Birth: Oct 16, 1940  Subjective/Objective: Inpatient recommended during daily OBS call with Dr Doy Hutching                   Action/Plan:call placed to Dr Murvin Natal   Expected Discharge Date:                  Expected Discharge Plan:     In-House Referral:     Discharge planning Services  CM Consult  Post Acute Care Choice:    Choice offered to:     DME Arranged:    DME Agency:     HH Arranged:    [place] Agency:     Status of Service:  In process, will continue to follow  If discussed at Long Length of Stay Meetings, dates discussed:    Additional Comments:  Ival Bible, RN 04/14/2017, 1:27 PM

## 2017-04-14 NOTE — Progress Notes (Signed)
PROGRESS NOTE    Jordan [name]  PJK:[id number]  DOB: Oct 30, 1940  DOA: 04/12/2017 PCP: Terald Sleeper, PA-C   Brief Admission Hx: Jordan [name] is a 77 y.o. female with medical history significant for -COPD, asthma , DM 2, HTN, myeloproliferative disease-thrombocytosis, anxiety.  Patient presented to the ED with complaints of shortness of breath and increased cough of one week duration.  Cough is productive of greenish sputum, with associated wheezing.  Patient reports subjective fevers chills and sweats at night. Patient also reports sore throat, runny nose initially before shortness of breath started.  Patient is on 2 L O2 at home.  Patient saw her PCP 2/1, for these symptoms , flu check was negative, was prescribed Levaquin and prednisone, she has completed 2 days of antibiotics, presented to the ED with worsening symptoms.   MDM/Assessment & Plan:   1. Acute COPD Exacerbation - Pt slowly improving, ambulating much more today, still diffuse wheezing and coughing, continue current therapies. Pt could enroll in advanced home care COPD program per CM needs palliative care and pulmonary consult.  2. Mild AKI - prerenal - resolved with IVF hydration.  3. Anxiety/Depression - anxiolytics, paroxetine.  4. Type 2 DM - sliding scale coverage in hospital, titrate, possibly add prandial coverage if needed.  5. Myeloproliferative Disease - resume home hydrea.    DVT prophylaxis: lovenox Code Status: full  Family Communication: spouse Disposition Plan: home when medically improved   Subjective: Pt awake, alert, says she is starting to feel better, noticing improvement. She is ambulating in the room today.   Objective: Vitals:   04/14/17 0912 04/14/17 1131 04/14/17 1426 04/14/17 1606  BP:   114/68   Pulse:   84   Resp:   18   Temp:   98.6 F (37 C)   TempSrc:   Oral   SpO2: 97% 97% 96% 99%  Weight:      Height:        Intake/Output Summary (Last 24 hours) at 04/14/2017 1637 Last data filed  at 04/14/2017 0900 Gross per 24 hour  Intake 480 ml  Output 300 ml  Net 180 ml   Filed Weights   04/12/17 1156 04/12/17 1819  Weight: 78.5 kg (173 lb) 78.2 kg (172 lb 6.4 oz)     REVIEW OF SYSTEMS  As per history otherwise all reviewed and reported negative  Exam:  General exam: awake, alert, NAD, cooperative.  Respiratory system: diffuse exp wheezing. No increased work of breathing. Cardiovascular system: S1 & S2 heard, RRR. No JVD, murmurs, gallops, clicks or pedal edema. Gastrointestinal system: Abdomen is nondistended, soft and nontender. Normal bowel sounds heard. Central nervous system: Alert and oriented. No focal neurological deficits. Extremities: no CCE.  Data Reviewed: Basic Metabolic Panel: Recent Labs  Lab 04/12/17 1203 04/13/17 0551  NA 136 139  K 3.8 3.9  CL 99* 104  CO2 23 23  GLUCOSE 131* 209*  BUN 15 13  CREATININE 1.02* 0.83  CALCIUM 9.1 8.7*   Liver Function Tests: Recent Labs  Lab 04/12/17 1203  AST 18  ALT 9*  ALKPHOS 75  BILITOT 1.4*  PROT 7.4  ALBUMIN 4.0   No results for input(s): LIPASE, AMYLASE in the last 168 hours. No results for input(s): AMMONIA in the last 168 hours. CBC: Recent Labs  Lab 04/12/17 1203  WBC 9.0  NEUTROABS 6.6  HGB 13.1  HCT 40.9  MCV 111.1*  PLT 414*   Cardiac Enzymes: Recent Labs  Lab 04/12/17 1203  TROPONINI <0.03   CBG (last 3)  Recent Labs    04/14/17 0751 04/14/17 1152 04/14/17 1616  GLUCAP 188* 232* 159*   Recent Results (from the past 240 hour(s))  Blood Culture (routine x 2)     Status: None (Preliminary result)   Collection Time: 04/12/17 12:15 PM  Result Value Ref Range Status   Specimen Description BLOOD LEFT HAND  Final   Special Requests   Final    BOTTLES DRAWN AEROBIC AND ANAEROBIC Blood Culture adequate volume   Culture   Final    NO GROWTH 2 DAYS Performed at [facility], [address], [place], [place] [address]    Report Status PENDING  Incomplete  Blood Culture  (routine x 2)     Status: None (Preliminary result)   Collection Time: 04/12/17 12:37 PM  Result Value Ref Range Status   Specimen Description BLOOD RIGHT HAND  Final   Special Requests   Final    BOTTLES DRAWN AEROBIC AND ANAEROBIC Blood Culture adequate volume   Culture   Final    NO GROWTH 2 DAYS Performed at [facility], [address], [place], [place] [address]    Report Status PENDING  Incomplete  Respiratory Panel by PCR     Status: None   Collection Time: 04/12/17  8:00 PM  Result Value Ref Range Status   Adenovirus NOT DETECTED NOT DETECTED Final   Coronavirus 229E NOT DETECTED NOT DETECTED Final   Coronavirus HKU1 NOT DETECTED NOT DETECTED Final   Coronavirus NL63 NOT DETECTED NOT DETECTED Final   Coronavirus OC43 NOT DETECTED NOT DETECTED Final   Metapneumovirus NOT DETECTED NOT DETECTED Final   Rhinovirus / Enterovirus NOT DETECTED NOT DETECTED Final   Influenza A NOT DETECTED NOT DETECTED Final   Influenza B NOT DETECTED NOT DETECTED Final   Parainfluenza Virus 1 NOT DETECTED NOT DETECTED Final   Parainfluenza Virus 2 NOT DETECTED NOT DETECTED Final   Parainfluenza Virus 3 NOT DETECTED NOT DETECTED Final   Parainfluenza Virus 4 NOT DETECTED NOT DETECTED Final   Respiratory Syncytial Virus NOT DETECTED NOT DETECTED Final   Bordetella pertussis NOT DETECTED NOT DETECTED Final   Chlamydophila pneumoniae NOT DETECTED NOT DETECTED Final   Mycoplasma pneumoniae NOT DETECTED NOT DETECTED Final    Comment: Performed at [facility], [place] [address], [place], [place] [address]     Studies: X-ray Chest Pa And Lateral  Result Date: 04/13/2017 CLINICAL DATA:  Shortness of breath and fevers and night for 1 week. EXAM: CHEST  2 VIEW COMPARISON:  PA and lateral chest 02/12/2017 and 12/18/2014. FINDINGS: The lungs are clear. Heart size is normal. No pneumothorax or pleural effusion. Aortic atherosclerosis is noted. No acute bony abnormality. Scoliosis noted. Calcified breast  implants are in place. IMPRESSION: No acute disease. Atherosclerosis. Scoliosis. Electronically Signed   By: Inge Rise M.D.   On: 04/13/2017 10:19     Scheduled Meds: . aspirin EC  81 mg Oral Daily  . azithromycin  500 mg Oral Daily  . budesonide (PULMICORT) nebulizer solution  0.25 mg Nebulization BID  . enoxaparin (LOVENOX) injection  40 mg Subcutaneous Q24H  . gabapentin  300 mg Oral QHS  . guaiFENesin  600 mg Oral BID  . hydroxyurea  1,000 mg Oral Once per day on Mon Tue Wed Fri Sat  . [START ON 04/16/2017] hydroxyurea  500 mg Oral Once per day on Sun Thu  . insulin aspart  0-9 Units Subcutaneous TID WC  .  ipratropium-albuterol  3 mL Nebulization Q4H  . levothyroxine  112 mcg Oral QAC breakfast  . mouth rinse  15 mL Mouth Rinse BID  . methylPREDNISolone (SOLU-MEDROL) injection  40 mg Intravenous Q12H  . metoprolol succinate  25 mg Oral Daily  . pantoprazole  40 mg Oral Daily  . PARoxetine  20 mg Oral Daily  . traZODone  100 mg Oral QHS   Continuous Infusions:  Principal Problem:   COPD exacerbation (HCC) Active Problems:   Asthma with COPD ([place])   Anxiety   Diabetes mellitus without complication ([place])   Hypertension   Hypothyroidism   Thrombocytosis ([place])   COPD (chronic obstructive pulmonary disease) ([place])   Time spent:   Irwin Brakeman, MD, FAAFP [facility] Pager [phone]  If 7PM-7AM, please contact night-coverage [email or web address] Password [email or web address] 04/14/2017, 4:37 PM    LOS: 0 days

## 2017-04-15 ENCOUNTER — Encounter (HOSPITAL_COMMUNITY): Payer: Self-pay

## 2017-04-15 DIAGNOSIS — F419 Anxiety disorder, unspecified: Secondary | ICD-10-CM

## 2017-04-15 DIAGNOSIS — D473 Essential (hemorrhagic) thrombocythemia: Secondary | ICD-10-CM

## 2017-04-15 DIAGNOSIS — E039 Hypothyroidism, unspecified: Secondary | ICD-10-CM

## 2017-04-15 DIAGNOSIS — J441 Chronic obstructive pulmonary disease with (acute) exacerbation: Principal | ICD-10-CM

## 2017-04-15 DIAGNOSIS — R0602 Shortness of breath: Secondary | ICD-10-CM

## 2017-04-15 DIAGNOSIS — I10 Essential (primary) hypertension: Secondary | ICD-10-CM

## 2017-04-15 DIAGNOSIS — R0603 Acute respiratory distress: Secondary | ICD-10-CM

## 2017-04-15 DIAGNOSIS — Z515 Encounter for palliative care: Secondary | ICD-10-CM

## 2017-04-15 DIAGNOSIS — E119 Type 2 diabetes mellitus without complications: Secondary | ICD-10-CM

## 2017-04-15 LAB — GLUCOSE, CAPILLARY
GLUCOSE-CAPILLARY: 193 mg/dL — AB (ref 65–99)
Glucose-Capillary: 166 mg/dL — ABNORMAL HIGH (ref 65–99)
Glucose-Capillary: 261 mg/dL — ABNORMAL HIGH (ref 65–99)
Glucose-Capillary: 174 mg/dL — ABNORMAL HIGH (ref 65–99)

## 2017-04-15 MED ORDER — BISACODYL 10 MG RE SUPP
10.0000 mg | Freq: Once | RECTAL | Status: AC
  Administered 2017-04-15: 10 mg via RECTAL
  Filled 2017-04-15: qty 1

## 2017-04-15 MED ORDER — IPRATROPIUM-ALBUTEROL 0.5-2.5 (3) MG/3ML IN SOLN
3.0000 mL | Freq: Four times a day (QID) | RESPIRATORY_TRACT | Status: DC | PRN
  Administered 2017-04-17: 3 mL via RESPIRATORY_TRACT
  Filled 2017-04-15: qty 3

## 2017-04-15 MED ORDER — IPRATROPIUM-ALBUTEROL 0.5-2.5 (3) MG/3ML IN SOLN
3.0000 mL | Freq: Three times a day (TID) | RESPIRATORY_TRACT | Status: DC
  Administered 2017-04-16 – 2017-04-17 (×4): 3 mL via RESPIRATORY_TRACT
  Filled 2017-04-15 (×5): qty 3

## 2017-04-15 NOTE — Consult Note (Signed)
Consult requested by: [facility] hospitalist Dr. Wynetta Emery Consult requested for: COPD exacerbation/acute on chronic hypoxic respiratory failure  HPI: This is a 77 year old who has previous history of COPD with a significant asthmatic component, diabetes, hypertension, myeloproliferative disease and anxiety.  She says that she got sick several days prior to admission with cough congestion sore throat and then she started bringing up green sputum.  She went to her primary care physician was checked for influenza which was negative and she was started on prednisone and Levaquin but she continued to get worse and came to the emergency room.  She is on oxygen at home.  She denies any chest pain nausea vomiting diarrhea headache swelling of her legs.  Past Medical History:  Diagnosis Date  . Anxiety   . Arthritis   . Cancer [facility])    Skin cancer  . Cataracts, both eyes 05/2014  . COPD (chronic obstructive pulmonary disease) (HCC)    intermittent home O2 - rarely uses  . Depression   . Diabetes mellitus without complication ([place])   . GERD (gastroesophageal reflux disease)   . Hyperkalemia   . Hyperlipidemia   . Hypertension   . Seizures ([place])    From RH Negative blood when pregnant  . Stroke ([place])   . Thrombocytosis ([place])   . Thyroid disease      Family History  Problem Relation Age of Onset  . Alzheimer's disease Mother   . Arthritis Father   . Stroke Maternal Aunt      Social History   Socioeconomic History  . Marital status: Married    Spouse name: None  . Number of children: None  . Years of education: None  . Highest education level: None  Social Needs  . Financial resource strain: None  . Food insecurity - worry: None  . Food insecurity - inability: None  . Transportation needs - medical: None  . Transportation needs - non-medical: None  Occupational History  . Occupation: retired  Tobacco Use  . Smoking status: Former Smoker    Packs/day: 3.00    Years: 36.00    Pack  years: 108.00    Types: Cigarettes    Last attempt to quit: 03/10/2000    Years since quitting: 17.1  . Smokeless tobacco: Never Used  Substance and Sexual Activity  . Alcohol use: No  . Drug use: No  . Sexual activity: No  Other Topics Concern  . None  Social History Narrative  . None     ROS: Except as mentioned 10 point review of systems is negative    Objective: Vital signs in last 24 hours: Temp:  [98 F (36.7 C)-98.6 F (37 C)] 98.2 F (36.8 C) (02/06 0616) Pulse Rate:  [68-92] 68 (02/06 0616) Resp:  [18] 18 (02/06 0616) BP: (114-128)/(68-95) 122/71 (02/06 0616) SpO2:  [83 %-99 %] 83 % (02/06 0902) Weight change:  Last BM Date: 04/11/17  Intake/Output from previous day: 02/05 0701 - 02/06 0700 In: 480 [P.O.:480] Out: -   PHYSICAL EXAM Constitutional: She is awake and alert and wearing oxygen.  Eyes: Pupils react EOMI.  Ears nose mouth and throat: Her mucous membranes are moist.  Hearing is grossly normal.  Cardiovascular: Her heart is regular with normal heart sounds.  Respiratory: Her lungs are clear.  Gastrointestinal: Her abdomen is soft with no masses.  Skin: Warm and dry.  Musculoskeletal: Grossly normal strength in the upper and lower extremities.  Neurological: No focal abnormalities.  Psychiatric: She appears anxious  Lab Results: Basic Metabolic Panel: Recent Labs    04/12/17 1203 04/13/17 0551  NA 136 139  K 3.8 3.9  CL 99* 104  CO2 23 23  GLUCOSE 131* 209*  BUN 15 13  CREATININE 1.02* 0.83  CALCIUM 9.1 8.7*   Liver Function Tests: Recent Labs    04/12/17 1203  AST 18  ALT 9*  ALKPHOS 75  BILITOT 1.4*  PROT 7.4  ALBUMIN 4.0   No results for input(s): LIPASE, AMYLASE in the last 72 hours. No results for input(s): AMMONIA in the last 72 hours. CBC: Recent Labs    04/12/17 1203  WBC 9.0  NEUTROABS 6.6  HGB 13.1  HCT 40.9  MCV 111.1*  PLT 414*   Cardiac Enzymes: Recent Labs    04/12/17 1203  TROPONINI <0.03   BNP: No  results for input(s): PROBNP in the last 72 hours. D-Dimer: No results for input(s): DDIMER in the last 72 hours. CBG: Recent Labs    04/13/17 2112 04/14/17 0751 04/14/17 1152 04/14/17 1616 04/14/17 2150 04/15/17 0740  GLUCAP 190* 188* 232* 159* 154* 193*   Hemoglobin A1C: No results for input(s): HGBA1C in the last 72 hours. Fasting Lipid Panel: No results for input(s): CHOL, HDL, LDLCALC, TRIG, CHOLHDL, LDLDIRECT in the last 72 hours. Thyroid Function Tests: No results for input(s): TSH, T4TOTAL, FREET4, T3FREE, THYROIDAB in the last 72 hours. Anemia Panel: No results for input(s): VITAMINB12, FOLATE, FERRITIN, TIBC, IRON, RETICCTPCT in the last 72 hours. Coagulation: No results for input(s): LABPROT, INR in the last 72 hours. Urine Drug Screen: Drugs of Abuse  No results found for: LABOPIA, COCAINSCRNUR, LABBENZ, AMPHETMU, THCU, LABBARB  Alcohol Level: No results for input(s): ETH in the last 72 hours. Urinalysis: No results for input(s): COLORURINE, LABSPEC, PHURINE, GLUCOSEU, HGBUR, BILIRUBINUR, KETONESUR, PROTEINUR, UROBILINOGEN, NITRITE, LEUKOCYTESUR in the last 72 hours.  Invalid input(s): APPERANCEUR Misc. Labs:   ABGS: No results for input(s): PHART, PO2ART, TCO2, HCO3 in the last 72 hours.  Invalid input(s): PCO2   MICROBIOLOGY: Recent Results (from the past 240 hour(s))  Blood Culture (routine x 2)     Status: None (Preliminary result)   Collection Time: 04/12/17 12:15 PM  Result Value Ref Range Status   Specimen Description BLOOD LEFT HAND  Final   Special Requests   Final    BOTTLES DRAWN AEROBIC AND ANAEROBIC Blood Culture adequate volume   Culture   Final    NO GROWTH 3 DAYS Performed at [facility] [address] [place], [place] [address]    Report Status PENDING  Incomplete  Blood Culture (routine x 2)     Status: None (Preliminary result)   Collection Time: 04/12/17 12:37 PM  Result Value Ref Range Status   Specimen Description BLOOD  RIGHT HAND  Final   Special Requests   Final    BOTTLES DRAWN AEROBIC AND ANAEROBIC Blood Culture adequate volume   Culture   Final    NO GROWTH 3 DAYS Performed at [facility], [address], [place], [place] [address]    Report Status PENDING  Incomplete  Respiratory Panel by PCR     Status: None   Collection Time: 04/12/17  8:00 PM  Result Value Ref Range Status   Adenovirus NOT DETECTED NOT DETECTED Final   Coronavirus 229E NOT DETECTED NOT DETECTED Final   Coronavirus HKU1 NOT DETECTED NOT DETECTED Final   Coronavirus NL63 NOT DETECTED NOT DETECTED Final   Coronavirus OC43 NOT DETECTED NOT DETECTED Final  Metapneumovirus NOT DETECTED NOT DETECTED Final   Rhinovirus / Enterovirus NOT DETECTED NOT DETECTED Final   Influenza A NOT DETECTED NOT DETECTED Final   Influenza B NOT DETECTED NOT DETECTED Final   Parainfluenza Virus 1 NOT DETECTED NOT DETECTED Final   Parainfluenza Virus 2 NOT DETECTED NOT DETECTED Final   Parainfluenza Virus 3 NOT DETECTED NOT DETECTED Final   Parainfluenza Virus 4 NOT DETECTED NOT DETECTED Final   Respiratory Syncytial Virus NOT DETECTED NOT DETECTED Final   Bordetella pertussis NOT DETECTED NOT DETECTED Final   Chlamydophila pneumoniae NOT DETECTED NOT DETECTED Final   Mycoplasma pneumoniae NOT DETECTED NOT DETECTED Final    Comment: Performed at [facility], [place] [address], [place],  [address]    Studies/Results: X-ray Chest Pa And Lateral  Result Date: 04/13/2017 CLINICAL DATA:  Shortness of breath and fevers and night for 1 week. EXAM: CHEST  2 VIEW COMPARISON:  PA and lateral chest 02/12/2017 and 12/18/2014. FINDINGS: The lungs are clear. Heart size is normal. No pneumothorax or pleural effusion. Aortic atherosclerosis is noted. No acute bony abnormality. Scoliosis noted. Calcified breast implants are in place. IMPRESSION: No acute disease. Atherosclerosis. Scoliosis. Electronically Signed   By: Inge Rise M.D.   On: 04/13/2017  10:19    Medications:  Prior to Admission:  Medications Prior to Admission  Medication Sig Dispense Refill Last Dose  . albuterol (PROVENTIL) (2.5 MG/3ML) 0.083% nebulizer solution Take 3 mLs (2.5 mg total) by nebulization every 6 (six) hours as needed for wheezing or shortness of breath. 75 mL 3 04/11/2017 at Unknown time  . albuterol (VENTOLIN HFA) 108 (90 Base) MCG/ACT inhaler Inhale 2 puffs into the lungs every 4 (four) hours as needed for wheezing or shortness of breath. 18 Inhaler 5 04/11/2017 at Unknown time  . aspirin EC 81 MG tablet Take 81 mg by mouth daily.   04/11/2017 at Unknown time  . fexofenadine-pseudoephedrine (ALLEGRA-D 24) 180-240 MG 24 hr tablet Take 1 tablet by mouth 2 (two) times daily.    04/11/2017 at Unknown time  . gabapentin (NEURONTIN) 300 MG capsule TAKE 1 CAPSULE BY MOUTH EVERYDAY AT BEDTIME 30 capsule 0 04/11/2017 at Unknown time  . hydroxyurea (HYDREA) 500 MG capsule TAKE 2 CAPSULES ON MON-TUES-WED-FRI-SAT. Take 1 po on thurs and sunday (Patient taking differently: Take 500-1,000 mg by mouth See admin instructions. TAKE 2 CAPSULES ON MON-TUES-WED-FRI-SAT. Take 1 po on thurs and sunday) 48 capsule 4 04/11/2017 at Unknown time  . ipratropium (ATROVENT) 0.02 % nebulizer solution Take 2.5 mLs (0.5 mg total) by nebulization every 6 (six) hours as needed for wheezing or shortness of breath. 75 mL 3 04/11/2017 at Unknown time  . levofloxacin (LEVAQUIN) 750 MG tablet Take 1 tablet (750 mg total) by mouth daily. 7 tablet 0 04/11/2017 at 1600  . levothyroxine (SYNTHROID, LEVOTHROID) 112 MCG tablet TAKE 1 TABLET BY MOUTH EVERY DAY 90 tablet 0 04/11/2017 at Unknown time  . LORazepam (ATIVAN) 0.5 MG tablet Take 1 tablet (0.5 mg total) by mouth at bedtime as needed for anxiety or sleep. 30 tablet 0 Past Month at Unknown time  . metFORMIN (GLUCOPHAGE) 500 MG tablet TAKE 1 TABLET (500 MG TOTAL) BY MOUTH 2 (TWO) TIMES DAILY WITH A MEAL. 60 tablet 2 04/11/2017 at Unknown time  . metoprolol succinate  (TOPROL-XL) 25 MG 24 hr tablet TAKE 1 TABLET BY MOUTH EVERY DAY 90 tablet 0 04/11/2017 at 1200  . Multiple Vitamins-Minerals (ICAPS AREDS 2 PO) Take 1 capsule  by mouth 2 (two) times daily.   04/11/2017 at Unknown time  . omeprazole (PRILOSEC) 20 MG capsule TAKE ONE CAPSULE BY MOUTH EVERY MORNING 30 MINUTES PRIOR TO YOUR FIRST MEAL 31 capsule 11 04/12/2017 at Unknown time  . PARoxetine (PAXIL) 20 MG tablet TAKE 1 TABLET BY MOUTH EVERY DAY 30 tablet 1 Past Week at Unknown time  . promethazine (PHENERGAN) 12.5 MG tablet Take 1 tablet (12.5 mg total) by mouth every 8 (eight) hours as needed for nausea or vomiting. 10 tablet 0 Past Month at Unknown time  . traZODone (DESYREL) 100 MG tablet Take 0.5-1 tablets (50-100 mg total) by mouth at bedtime. 30 tablet 3 UNKNOWN   Scheduled: . aspirin EC  81 mg Oral Daily  . azithromycin  500 mg Oral Daily  . budesonide (PULMICORT) nebulizer solution  0.25 mg Nebulization BID  . enoxaparin (LOVENOX) injection  40 mg Subcutaneous Q24H  . gabapentin  300 mg Oral QHS  . guaiFENesin  600 mg Oral BID  . hydroxyurea  1,000 mg Oral Once per day on Mon Tue Wed Fri Sat  . [START ON 04/16/2017] hydroxyurea  500 mg Oral Once per day on Sun Thu  . insulin aspart  0-9 Units Subcutaneous TID WC  . ipratropium-albuterol  3 mL Nebulization Q6H  . levothyroxine  112 mcg Oral QAC breakfast  . mouth rinse  15 mL Mouth Rinse BID  . methylPREDNISolone (SOLU-MEDROL) injection  40 mg Intravenous Q12H  . metoprolol succinate  25 mg Oral Daily  . pantoprazole  40 mg Oral Daily  . PARoxetine  20 mg Oral Daily  . traZODone  100 mg Oral QHS   Continuous:  TLX:[id number], alum & mag hydroxide-simeth, ondansetron **OR** ondansetron (ZOFRAN) IV  Assesment: She was admitted with COPD exacerbation and acute on chronic hypoxic respiratory failure.  She seems to be doing much better.  I think she would be a good candidate for the pilot COPD program that we have ready to start next week and I  discussed that with her. Principal Problem:   COPD exacerbation ([place]) Active Problems:   Asthma with COPD ([place])   Anxiety   Diabetes mellitus without complication ([place])   Hypertension   Hypothyroidism   Thrombocytosis ([place])   COPD (chronic obstructive pulmonary disease) ([place])    Plan: Continue treatments.  Thanks for allowing me to see her    LOS: 1 day   Claudean Leavelle L 04/15/2017, 9:59 AM

## 2017-04-15 NOTE — Consult Note (Signed)
Consultation Note Date: 04/15/2017   Patient Name: Jordan [name]  DOB: September 05, 1940  MRN: [id number]  Age / Sex: 77 y.o., female  PCP: Jordan Sleeper, PA-C Referring Physician: Kathie Dike, MD  Reason for Consultation: Consultation required for [facility] COPD program  HPI/Patient Profile: 77 y.o. female  with past medical history of allergies, asthma, and COPD as well as CVA, Seizures, DM, and myeloproliferative disorder who was admitted on 04/12/2017 with shortness of breath and cough secondary to a COPD exacerbation.   Clinical Assessment and Goals of Care:  I have reviewed medical records including EPIC notes, labs and imaging, received report from the care team, assessed the patient and then talked with the patient about the progression of COPD and end of life.  I introduced Palliative Medicine as specialized medical care for people living with serious illness. It focuses on providing relief from the symptoms and stress of a serious illness. The goal is to improve quality of life for both the patient and the family.  We discussed a brief life review of the patient. She describes her childhood with asthma and terrible allergies.  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  Her 1st husband was Jordan [name] and they traveled the world together.  They had 1 daughter - whom the patient and her second husband live with not.  Jordan [name] cared for her aging parents thru their illnesses and death so she understands the dying process.  She has had asthma and allergies all of her life.  Approx 23 years ago she was first diagnosed with COPD while living in [place].  Approximately 5 years ago she felt so debilitated that she and her husband moved in with her daughter in [place], [place].  She started going to Jordan [name] pulmonologist and  asked him to give her enough months to see her grand daughter get married.  5 years later she is here able to ambulate, independent with ADLs on 2L of oxygen.  She understands COPD is a progressive illness that will not improve.  She states "It's down hill from here".  I attempted to convince her that there will hopefully be some plateaus.  She derives great strength from her husband and daughter.  She found out this morning she will soon be a great grand mother.  She is a DNR.  She is very reasonable in her thinking about COPD and its progression.  Primary Decision Maker:  PATIENT    SUMMARY OF RECOMMENDATIONS    Move forward with COPD program  Code Status/Advance Care Planning:  DNR   Symptom Management:   Would prescribe low dose opioid for SOB, however, patient is allergic to it.  Prognosis:  Unable to determine    Discharge Planning: Home with Home Health      Primary Diagnoses: Present on Admission: . COPD exacerbation ([place]) . Hypothyroidism . Asthma with COPD ([place]) . Anxiety . Hypertension . Thrombocytosis ([place]) .  COPD (chronic obstructive pulmonary disease) ([place])   I have reviewed the medical record, interviewed the patient and family, and examined the patient. The following aspects are pertinent.  Past Medical History:  Diagnosis Date  . Anxiety   . Arthritis   . Cancer [facility])    Skin cancer  . Cataracts, both eyes 05/2014  . COPD (chronic obstructive pulmonary disease) ([facility])    intermittent home O2 - rarely uses  . Depression   . Diabetes mellitus without complication ([place])   . GERD (gastroesophageal reflux disease)   . Hyperkalemia   . Hyperlipidemia   . Hypertension   . Seizures ([place])    From RH Negative blood when pregnant  . Stroke ([place])   . Thrombocytosis ([place])   . Thyroid disease    Social History   Socioeconomic History  . Marital status: Married    Spouse name: None  . Number of children: None  . Years of education: None  .  Highest education level: None  Social Needs  . Financial resource strain: None  . Food insecurity - worry: None  . Food insecurity - inability: None  . Transportation needs - medical: None  . Transportation needs - non-medical: None  Occupational History  . Occupation: retired  Tobacco Use  . Smoking status: Former Smoker    Packs/day: 3.00    Years: 36.00    Pack years: 108.00    Types: Cigarettes    Last attempt to quit: 03/10/2000    Years since quitting: 17.1  . Smokeless tobacco: Never Used  Substance and Sexual Activity  . Alcohol use: No  . Drug use: No  . Sexual activity: No  Other Topics Concern  . None  Social History Narrative  . None   Family History  Problem Relation Age of Onset  . Alzheimer's disease Mother   . Arthritis Father   . Stroke Maternal Aunt    Scheduled Meds: . aspirin EC  81 mg Oral Daily  . azithromycin  500 mg Oral Daily  . budesonide (PULMICORT) nebulizer solution  0.25 mg Nebulization BID  . enoxaparin (LOVENOX) injection  40 mg Subcutaneous Q24H  . gabapentin  300 mg Oral QHS  . guaiFENesin  600 mg Oral BID  . hydroxyurea  1,000 mg Oral Once per day on Mon Tue Wed Fri Sat  . [START ON 04/16/2017] hydroxyurea  500 mg Oral Once per day on Sun Thu  . insulin aspart  0-9 Units Subcutaneous TID WC  . ipratropium-albuterol  3 mL Nebulization Q6H  . levothyroxine  112 mcg Oral QAC breakfast  . mouth rinse  15 mL Mouth Rinse BID  . methylPREDNISolone (SOLU-MEDROL) injection  40 mg Intravenous Q12H  . metoprolol succinate  25 mg Oral Daily  . pantoprazole  40 mg Oral Daily  . PARoxetine  20 mg Oral Daily  . traZODone  100 mg Oral QHS   Continuous Infusions: PRN Meds:.ALPRAZolam, alum & mag hydroxide-simeth, ondansetron **OR** ondansetron (ZOFRAN) IV Allergies  Allergen Reactions  . Bee Venom Anaphylaxis  . Codeine Itching  . Lipitor [Atorvastatin]     Extreme Dry Mouth, joint pain  . Penicillins Hives    Has patient had a PCN reaction  causing immediate rash, facial/tongue/throat swelling, SOB or lightheadedness with hypotension: No Has patient had a PCN reaction causing severe rash involving mucus membranes or skin necrosis: Yes Has patient had a PCN reaction that required hospitalization: No Has patient had a PCN reaction occurring within the last 10  years: No If all of the above answers are "NO", then may proceed with Cephalosporin use.   . Demeclocycline Rash  . Morphine And Related Rash  . Tetracyclines & Related Rash   Review of Systems no complaints other than SOB, weakness and fatigue.  Physical Exam  Well developed elderly female, very pleasant, standing at bedside. CV difficult to hear, but no frank M/R/G Resp decreased breath sounds on the left anterior, normal otherwise, becomes SOB when speaking on 2L Abdomen soft, nt, nd  Vital Signs: BP 122/71 (BP Location: Left Arm)   Pulse 68   Temp 98.2 F (36.8 C) (Oral)   Resp 18   Ht 5\' 6"  (1.676 m)   Wt 78.2 kg (172 lb 6.4 oz)   SpO2 (!) 83% Comment: patient was moving around without O@, placed back on O2 post treatment  BMI 27.83 kg/m  Pain Assessment: No/denies pain   Pain Score: 0-No pain   SpO2: SpO2: (!) 83 %(patient was moving around without O@, placed back on O2 post treatment) O2 Device:SpO2: (!) 83 %(patient was moving around without O@, placed back on O2 post treatment) O2 Flow Rate: .O2 Flow Rate (L/min): 2 L/min  IO: Intake/output summary:   Intake/Output Summary (Last 24 hours) at 04/15/2017 1047 Last data filed at 04/14/2017 1900 Gross per 24 hour  Intake 240 ml  Output -  Net 240 ml    LBM: Last BM Date: 04/11/17 Baseline Weight: Weight: 78.5 kg (173 lb) Most recent weight: Weight: 78.2 kg (172 lb 6.4 oz)     Palliative Assessment/Data: 60%     Time In: 10:30 Time Out: 11:00 Time Total: 30 min. Greater than 50%  of this time was spent counseling and coordinating care related to the above assessment and plan.  Signed  by: Florentina Jenny, PA-C Palliative Medicine Pager: [phone]  Please contact Palliative Medicine Team phone at [phone] for questions and concerns.  For individual provider: See Shea Evans

## 2017-04-15 NOTE — Care Management (Signed)
Patient agreeable to participate in COPD pilot. She has had consultation with pulmonology and palliative. She has home oxygen with [facility] already.  Will order [facility] RN/COPD upon discharge.

## 2017-04-15 NOTE — Progress Notes (Signed)
PROGRESS NOTE    Jordan [name]  FWY:[id number] DOB: [birth year] DOA: 04/12/2017 PCP: Terald Sleeper, PA-C    Brief Narrative:  77 year old female with a history of oxygen dependent COPD, presented with worsening shortness of breath consistent with COPD exacerbation.  On IV steroids, bronchodilators and antibiotics.  Slowly improving.   Assessment & Plan:   Principal Problem:   COPD exacerbation ([place]) Active Problems:   Asthma with COPD ([place])   Anxiety   Diabetes mellitus without complication ([place])   Hypertension   Hypothyroidism   Thrombocytosis ([facility])   COPD (chronic obstructive pulmonary disease) ([facility])   Respiratory distress   SOB (shortness of breath)   Palliative care encounter   1. Acute exacerbation of COPD.  Slowly improving.  On intravenous steroids, bronchodilators and antibiotics.  Seen by pulmonology as well as palliative care to enroll in the advanced home care COPD program.  Continue current treatments.  2. Chronic respiratory failure with hypoxia due to COPD.  On home oxygen. 3. Mild AK I.  Prerenal.  Resolved with IV hydration. 4. Anxiety/depression.  Continue on anxiolytics, stable. 5. Diabetes.  Sliding scale insulin.  Blood sugars are currently stable. 6. Myeloproliferative disease/thrombocytosis.  Continue on home dose of Hydrea.   DVT prophylaxis: Lovenox Code Status: DNR Family Communication: No family present Disposition Plan: Discharge home once improved   Consultants:   Pulmonology  Palliative care  Procedures:     Antimicrobials:   Azithromycin 2/3 >   Subjective: Still feels short of breath, wheezing, has light green sputum produced during cough.  Objective: Vitals:   04/15/17 0616 04/15/17 0902 04/15/17 1510 04/15/17 1517  BP: 122/71   100/82  Pulse: 68   92  Resp: 18   20  Temp: 98.2 F (36.8 C)   97.7 F (36.5 C)  TempSrc: Oral   Oral  SpO2: 98% (!) 83% 98% 92%  Weight:      Height:        Intake/Output Summary (Last 24  hours) at 04/15/2017 1753 Last data filed at 04/15/2017 1223 Gross per 24 hour  Intake 720 ml  Output -  Net 720 ml   Filed Weights   04/12/17 1156 04/12/17 1819  Weight: 78.5 kg (173 lb) 78.2 kg (172 lb 6.4 oz)    Examination:  General exam: Appears calm and comfortable  Respiratory system: Bilateral wheezing.  Increased respiratory effort. Cardiovascular system: S1 & S2 heard, RRR. No JVD, murmurs, rubs, gallops or clicks. No pedal edema. Gastrointestinal system: Abdomen is nondistended, soft and nontender. No organomegaly or masses felt. Normal bowel sounds heard. Central nervous system: Alert and oriented. No focal neurological deficits. Extremities: Symmetric 5 x 5 power. Skin: No rashes, lesions or ulcers Psychiatry: Judgement and insight appear normal. Mood & affect appropriate.     Data Reviewed: I have personally reviewed following labs and imaging studies  CBC: Recent Labs  Lab 04/12/17 1203  WBC 9.0  NEUTROABS 6.6  HGB 13.1  HCT 40.9  MCV 111.1*  PLT 277*   Basic Metabolic Panel: Recent Labs  Lab 04/12/17 1203 04/13/17 0551  NA 136 139  K 3.8 3.9  CL 99* 104  CO2 23 23  GLUCOSE 131* 209*  BUN 15 13  CREATININE 1.02* 0.83  CALCIUM 9.1 8.7*   GFR: Estimated Creatinine Clearance: 59.9 mL/min (by C-G formula based on SCr of 0.83 mg/dL). Liver Function Tests: Recent Labs  Lab 04/12/17 1203  AST 18  ALT 9*  ALKPHOS 75  BILITOT 1.4*  PROT 7.4  ALBUMIN 4.0   No results for input(s): LIPASE, AMYLASE in the last 168 hours. No results for input(s): AMMONIA in the last 168 hours. Coagulation Profile: No results for input(s): INR, PROTIME in the last 168 hours. Cardiac Enzymes: Recent Labs  Lab 04/12/17 1203  TROPONINI <0.03   BNP (last 3 results) No results for input(s): PROBNP in the last 8760 hours. HbA1C: No results for input(s): HGBA1C in the last 72 hours. CBG: Recent Labs  Lab 04/14/17 1616 04/14/17 2150 04/15/17 0740 04/15/17 1123  04/15/17 1636  GLUCAP 159* 154* 193* 166* 261*   Lipid Profile: No results for input(s): CHOL, HDL, LDLCALC, TRIG, CHOLHDL, LDLDIRECT in the last 72 hours. Thyroid Function Tests: No results for input(s): TSH, T4TOTAL, FREET4, T3FREE, THYROIDAB in the last 72 hours. Anemia Panel: No results for input(s): VITAMINB12, FOLATE, FERRITIN, TIBC, IRON, RETICCTPCT in the last 72 hours. Sepsis Labs: Recent Labs  Lab 04/12/17 1220  LATICACIDVEN 1.86    Recent Results (from the past 240 hour(s))  Blood Culture (routine x 2)     Status: None (Preliminary result)   Collection Time: 04/12/17 12:15 PM  Result Value Ref Range Status   Specimen Description BLOOD LEFT HAND  Final   Special Requests   Final    BOTTLES DRAWN AEROBIC AND ANAEROBIC Blood Culture adequate volume   Culture   Final    NO GROWTH 3 DAYS Performed at [facility], [address] [place], [place] [address]    Report Status PENDING  Incomplete  Blood Culture (routine x 2)     Status: None (Preliminary result)   Collection Time: 04/12/17 12:37 PM  Result Value Ref Range Status   Specimen Description BLOOD RIGHT HAND  Final   Special Requests   Final    BOTTLES DRAWN AEROBIC AND ANAEROBIC Blood Culture adequate volume   Culture   Final    NO GROWTH 3 DAYS Performed at [facility], [address], [place], [place] [address]    Report Status PENDING  Incomplete  Respiratory Panel by PCR     Status: None   Collection Time: 04/12/17  8:00 PM  Result Value Ref Range Status   Adenovirus NOT DETECTED NOT DETECTED Final   Coronavirus 229E NOT DETECTED NOT DETECTED Final   Coronavirus HKU1 NOT DETECTED NOT DETECTED Final   Coronavirus NL63 NOT DETECTED NOT DETECTED Final   Coronavirus OC43 NOT DETECTED NOT DETECTED Final   Metapneumovirus NOT DETECTED NOT DETECTED Final   Rhinovirus / Enterovirus NOT DETECTED NOT DETECTED Final   Influenza A NOT DETECTED NOT DETECTED Final   Influenza B NOT DETECTED NOT DETECTED Final    Parainfluenza Virus 1 NOT DETECTED NOT DETECTED Final   Parainfluenza Virus 2 NOT DETECTED NOT DETECTED Final   Parainfluenza Virus 3 NOT DETECTED NOT DETECTED Final   Parainfluenza Virus 4 NOT DETECTED NOT DETECTED Final   Respiratory Syncytial Virus NOT DETECTED NOT DETECTED Final   Bordetella pertussis NOT DETECTED NOT DETECTED Final   Chlamydophila pneumoniae NOT DETECTED NOT DETECTED Final   Mycoplasma pneumoniae NOT DETECTED NOT DETECTED Final    Comment: Performed at [facility], [place] [address] [place], [place] [address]         Radiology Studies: No results found.      Scheduled Meds: . aspirin EC  81 mg Oral Daily  . azithromycin  500 mg Oral Daily  . budesonide (PULMICORT) nebulizer solution  0.25 mg Nebulization BID  . enoxaparin (  LOVENOX) injection  40 mg Subcutaneous Q24H  . gabapentin  300 mg Oral QHS  . guaiFENesin  600 mg Oral BID  . hydroxyurea  1,000 mg Oral Once per day on Mon Tue Wed Fri Sat  . [START ON 04/16/2017] hydroxyurea  500 mg Oral Once per day on Sun Thu  . insulin aspart  0-9 Units Subcutaneous TID WC  . ipratropium-albuterol  3 mL Nebulization Q6H  . levothyroxine  112 mcg Oral QAC breakfast  . mouth rinse  15 mL Mouth Rinse BID  . methylPREDNISolone (SOLU-MEDROL) injection  40 mg Intravenous Q12H  . metoprolol succinate  25 mg Oral Daily  . pantoprazole  40 mg Oral Daily  . PARoxetine  20 mg Oral Daily  . traZODone  100 mg Oral QHS   Continuous Infusions:   LOS: 1 day    Time spent: 64mins    Kathie Dike, MD [facility] Pager [phone]  If 7PM-7AM, please contact night-coverage [email or web address] Password [facility] 04/15/2017, 5:53 PM

## 2017-04-16 LAB — GLUCOSE, CAPILLARY
GLUCOSE-CAPILLARY: 155 mg/dL — AB (ref 65–99)
Glucose-Capillary: 170 mg/dL — ABNORMAL HIGH (ref 65–99)
Glucose-Capillary: 178 mg/dL — ABNORMAL HIGH (ref 65–99)
Glucose-Capillary: 191 mg/dL — ABNORMAL HIGH (ref 65–99)

## 2017-04-16 MED ORDER — POLYETHYLENE GLYCOL 3350 17 G PO PACK
17.0000 g | PACK | Freq: Every day | ORAL | Status: DC
  Administered 2017-04-16 – 2017-04-17 (×2): 17 g via ORAL
  Filled 2017-04-16 (×2): qty 1

## 2017-04-16 MED ORDER — PREDNISONE 20 MG PO TABS
40.0000 mg | ORAL_TABLET | Freq: Every day | ORAL | Status: DC
  Administered 2017-04-17: 40 mg via ORAL
  Filled 2017-04-16: qty 2

## 2017-04-16 NOTE — Progress Notes (Signed)
PROGRESS NOTE    Jordan [name]  WVP:[id number] DOB: 09/01/1940 DOA: 04/12/2017 PCP: Terald Sleeper, PA-C    Brief Narrative:  77 year old female with a history of oxygen dependent COPD, presented with worsening shortness of breath consistent with COPD exacerbation.  On IV steroids, bronchodilators and antibiotics.  Slowly improving.   Assessment & Plan:   Principal Problem:   COPD exacerbation ([place]) Active Problems:   Asthma with COPD ([place])   Anxiety   Diabetes mellitus without complication ([place])   Hypertension   Hypothyroidism   Thrombocytosis ([facility])   COPD (chronic obstructive pulmonary disease) ([facility])   Respiratory distress   SOB (shortness of breath)   Palliative care encounter   1. Acute exacerbation of COPD.  Admitted with shortness of breath and wheezing.  Overall she is slowly improving.  Continue on bronchodilators and antibiotics.  Transition Solu-Medrol to prednisone.  She has been seen by pulmonology as well as palliative care and will be enrolled in the COPD program. 2. Chronic respiratory failure with hypoxia due to COPD.  On home oxygen.  She is chronically on 2 L and this appears to be near baseline. 3. Mild AKI.  Prerenal related to dehydration.  Improved with IV fluids 4. Anxiety/depression.  Appears to be more anxious.  Steroids likely contributing to this.  Continue to monitor. 5. Diabetes.  Currently on sliding scale.  Blood sugars have been stable. 6. Myeloproliferative disease/thrombocytosis.  Continue on home dose of Hydrea.   DVT prophylaxis: Lovenox Code Status: DNR Family Communication: No family present Disposition Plan: Discharge home once improved, likely in a.m.   Consultants:   Pulmonology  Palliative care  Procedures:     Antimicrobials:   Azithromycin 2/3 >   Subjective: Feels that breathing is a little better today.  Not quite back to baseline.  No chest pain.  Continues to have productive cough.  Objective: Vitals:   04/16/17  0500 04/16/17 0647 04/16/17 0726 04/16/17 0730  BP: (!) 176/78 119/86    Pulse: 82     Resp: 20     Temp: (!) 97.4 F (36.3 C)     TempSrc: Oral     SpO2: 93%  99% 99%  Weight:      Height:        Intake/Output Summary (Last 24 hours) at 04/16/2017 1715 Last data filed at 04/16/2017 1304 Gross per 24 hour  Intake 480 ml  Output -  Net 480 ml   Filed Weights   04/12/17 1156 04/12/17 1819  Weight: 78.5 kg (173 lb) 78.2 kg (172 lb 6.4 oz)    Examination:  .General exam: Alert, awake, oriented x 3 Respiratory system: Still has wheezing bilaterally, although improving from yesterday. Respiratory effort normal. Cardiovascular system:RRR. No murmurs, rubs, gallops. Gastrointestinal system: Abdomen is nondistended, soft and nontender. No organomegaly or masses felt. Normal bowel sounds heard. Central nervous system: Alert and oriented. No focal neurological deficits. Extremities: No C/C/E, +pedal pulses Skin: No rashes, lesions or ulcers Psychiatry: Judgement and insight appear normal. Mood & affect appropriate.       Data Reviewed: I have personally reviewed following labs and imaging studies  CBC: Recent Labs  Lab 04/12/17 1203  WBC 9.0  NEUTROABS 6.6  HGB 13.1  HCT 40.9  MCV 111.1*  PLT 546*   Basic Metabolic Panel: Recent Labs  Lab 04/12/17 1203 04/13/17 0551  NA 136 139  K 3.8 3.9  CL 99* 104  CO2 23 23  GLUCOSE 131* 209*  BUN 15  13  CREATININE 1.02* 0.83  CALCIUM 9.1 8.7*   GFR: Estimated Creatinine Clearance: 59.9 mL/min (by C-G formula based on SCr of 0.83 mg/dL). Liver Function Tests: Recent Labs  Lab 04/12/17 1203  AST 18  ALT 9*  ALKPHOS 75  BILITOT 1.4*  PROT 7.4  ALBUMIN 4.0   No results for input(s): LIPASE, AMYLASE in the last 168 hours. No results for input(s): AMMONIA in the last 168 hours. Coagulation Profile: No results for input(s): INR, PROTIME in the last 168 hours. Cardiac Enzymes: Recent Labs  Lab 04/12/17 1203    TROPONINI <0.03   BNP (last 3 results) No results for input(s): PROBNP in the last 8760 hours. HbA1C: No results for input(s): HGBA1C in the last 72 hours. CBG: Recent Labs  Lab 04/15/17 1636 04/15/17 2232 04/16/17 0729 04/16/17 1104 04/16/17 1614  GLUCAP 261* 174* 155* 170* 178*   Lipid Profile: No results for input(s): CHOL, HDL, LDLCALC, TRIG, CHOLHDL, LDLDIRECT in the last 72 hours. Thyroid Function Tests: No results for input(s): TSH, T4TOTAL, FREET4, T3FREE, THYROIDAB in the last 72 hours. Anemia Panel: No results for input(s): VITAMINB12, FOLATE, FERRITIN, TIBC, IRON, RETICCTPCT in the last 72 hours. Sepsis Labs: Recent Labs  Lab 04/12/17 1220  LATICACIDVEN 1.86    Recent Results (from the past 240 hour(s))  Blood Culture (routine x 2)     Status: None (Preliminary result)   Collection Time: 04/12/17 12:15 PM  Result Value Ref Range Status   Specimen Description BLOOD LEFT HAND  Final   Special Requests   Final    BOTTLES DRAWN AEROBIC AND ANAEROBIC Blood Culture adequate volume   Culture   Final    NO GROWTH 4 DAYS Performed at [facility], [address], [place], [place] [address]    Report Status PENDING  Incomplete  Blood Culture (routine x 2)     Status: None (Preliminary result)   Collection Time: 04/12/17 12:37 PM  Result Value Ref Range Status   Specimen Description BLOOD RIGHT HAND  Final   Special Requests   Final    BOTTLES DRAWN AEROBIC AND ANAEROBIC Blood Culture adequate volume   Culture   Final    NO GROWTH 4 DAYS Performed at [facility], [address] [place], [place] [address]    Report Status PENDING  Incomplete  Respiratory Panel by PCR     Status: None   Collection Time: 04/12/17  8:00 PM  Result Value Ref Range Status   Adenovirus NOT DETECTED NOT DETECTED Final   Coronavirus 229E NOT DETECTED NOT DETECTED Final   Coronavirus HKU1 NOT DETECTED NOT DETECTED Final   Coronavirus NL63 NOT DETECTED NOT DETECTED Final    Coronavirus OC43 NOT DETECTED NOT DETECTED Final   Metapneumovirus NOT DETECTED NOT DETECTED Final   Rhinovirus / Enterovirus NOT DETECTED NOT DETECTED Final   Influenza A NOT DETECTED NOT DETECTED Final   Influenza B NOT DETECTED NOT DETECTED Final   Parainfluenza Virus 1 NOT DETECTED NOT DETECTED Final   Parainfluenza Virus 2 NOT DETECTED NOT DETECTED Final   Parainfluenza Virus 3 NOT DETECTED NOT DETECTED Final   Parainfluenza Virus 4 NOT DETECTED NOT DETECTED Final   Respiratory Syncytial Virus NOT DETECTED NOT DETECTED Final   Bordetella pertussis NOT DETECTED NOT DETECTED Final   Chlamydophila pneumoniae NOT DETECTED NOT DETECTED Final   Mycoplasma pneumoniae NOT DETECTED NOT DETECTED Final    Comment: Performed at [facility], [place] [address], [place], [place] [address]  Radiology Studies: No results found.      Scheduled Meds: . aspirin EC  81 mg Oral Daily  . azithromycin  500 mg Oral Daily  . budesonide (PULMICORT) nebulizer solution  0.25 mg Nebulization BID  . enoxaparin (LOVENOX) injection  40 mg Subcutaneous Q24H  . gabapentin  300 mg Oral QHS  . guaiFENesin  600 mg Oral BID  . hydroxyurea  1,000 mg Oral Once per day on Mon Tue Wed Fri Sat  . hydroxyurea  500 mg Oral Once per day on Sun Thu  . insulin aspart  0-9 Units Subcutaneous TID WC  . ipratropium-albuterol  3 mL Nebulization TID  . levothyroxine  112 mcg Oral QAC breakfast  . mouth rinse  15 mL Mouth Rinse BID  . metoprolol succinate  25 mg Oral Daily  . pantoprazole  40 mg Oral Daily  . PARoxetine  20 mg Oral Daily  . polyethylene glycol  17 g Oral Daily  . [START ON 04/17/2017] predniSONE  40 mg Oral Q breakfast  . traZODone  100 mg Oral QHS   Continuous Infusions:   LOS: 2 days    Time spent: 35mins    Kathie Dike, MD [facility] Pager [phone]  If 7PM-7AM, please contact night-coverage [email or web address] Password [email or web address] 04/16/2017, 5:15 PM

## 2017-04-16 NOTE — Progress Notes (Signed)
Subjective: She says she feels better.  She had trouble apparently with blood pressure and blood sugar last night but her breathing is pretty good.  She did cough up some sputum and that made her feel better.  Objective: Vital signs in last 24 hours: Temp:  [97.4 F (36.3 C)-98.3 F (36.8 C)] 97.4 F (36.3 C) (02/07 0500) Pulse Rate:  [75-92] 82 (02/07 0500) Resp:  [20] 20 (02/07 0500) BP: (100-176)/(48-86) 119/86 (02/07 0647) SpO2:  [83 %-100 %] 99 % (02/07 0730) Weight change:  Last BM Date: 04/15/17  Intake/Output from previous day: 02/06 0701 - 02/07 0700 In: 720 [P.O.:720] Out: -   PHYSICAL EXAM General appearance: alert, cooperative and no distress Resp: clear to auscultation bilaterally Cardio: regular rate and rhythm, S1, S2 normal, no murmur, click, rub or gallop GI: soft, non-tender; bowel sounds normal; no masses,  no organomegaly Extremities: extremities normal, atraumatic, no cyanosis or edema She is edentulous  Lab Results:  Results for orders placed or performed during the hospital encounter of 04/12/17 (from the past 48 hour(s))  Glucose, capillary     Status: Abnormal   Collection Time: 04/14/17 11:52 AM  Result Value Ref Range   Glucose-Capillary 232 (H) 65 - 99 mg/dL   Comment 1 Notify RN    Comment 2 Document in Chart   Glucose, capillary     Status: Abnormal   Collection Time: 04/14/17  4:16 PM  Result Value Ref Range   Glucose-Capillary 159 (H) 65 - 99 mg/dL   Comment 1 Notify RN    Comment 2 Document in Chart   Glucose, capillary     Status: Abnormal   Collection Time: 04/14/17  9:50 PM  Result Value Ref Range   Glucose-Capillary 154 (H) 65 - 99 mg/dL   Comment 1 Notify RN    Comment 2 Document in Chart   Glucose, capillary     Status: Abnormal   Collection Time: 04/15/17  7:40 AM  Result Value Ref Range   Glucose-Capillary 193 (H) 65 - 99 mg/dL  Glucose, capillary     Status: Abnormal   Collection Time: 04/15/17 11:23 AM  Result Value  Ref Range   Glucose-Capillary 166 (H) 65 - 99 mg/dL  Glucose, capillary     Status: Abnormal   Collection Time: 04/15/17  4:36 PM  Result Value Ref Range   Glucose-Capillary 261 (H) 65 - 99 mg/dL  Glucose, capillary     Status: Abnormal   Collection Time: 04/15/17 10:32 PM  Result Value Ref Range   Glucose-Capillary 174 (H) 65 - 99 mg/dL    ABGS No results for input(s): PHART, PO2ART, TCO2, HCO3 in the last 72 hours.  Invalid input(s): PCO2 CULTURES Recent Results (from the past 240 hour(s))  Blood Culture (routine x 2)     Status: None (Preliminary result)   Collection Time: 04/12/17 12:15 PM  Result Value Ref Range Status   Specimen Description BLOOD LEFT HAND  Final   Special Requests   Final    BOTTLES DRAWN AEROBIC AND ANAEROBIC Blood Culture adequate volume   Culture   Final    NO GROWTH 4 DAYS Performed at [facility], [address] [place], [place] [address]    Report Status PENDING  Incomplete  Blood Culture (routine x 2)     Status: None (Preliminary result)   Collection Time: 04/12/17 12:37 PM  Result Value Ref Range Status   Specimen Description BLOOD RIGHT HAND  Final   Special Requests  Final    BOTTLES DRAWN AEROBIC AND ANAEROBIC Blood Culture adequate volume   Culture   Final    NO GROWTH 4 DAYS Performed at [facility], [address], [place], [place] [address]    Report Status PENDING  Incomplete  Respiratory Panel by PCR     Status: None   Collection Time: 04/12/17  8:00 PM  Result Value Ref Range Status   Adenovirus NOT DETECTED NOT DETECTED Final   Coronavirus 229E NOT DETECTED NOT DETECTED Final   Coronavirus HKU1 NOT DETECTED NOT DETECTED Final   Coronavirus NL63 NOT DETECTED NOT DETECTED Final   Coronavirus OC43 NOT DETECTED NOT DETECTED Final   Metapneumovirus NOT DETECTED NOT DETECTED Final   Rhinovirus / Enterovirus NOT DETECTED NOT DETECTED Final   Influenza A NOT DETECTED NOT DETECTED Final   Influenza B NOT DETECTED NOT DETECTED  Final   Parainfluenza Virus 1 NOT DETECTED NOT DETECTED Final   Parainfluenza Virus 2 NOT DETECTED NOT DETECTED Final   Parainfluenza Virus 3 NOT DETECTED NOT DETECTED Final   Parainfluenza Virus 4 NOT DETECTED NOT DETECTED Final   Respiratory Syncytial Virus NOT DETECTED NOT DETECTED Final   Bordetella pertussis NOT DETECTED NOT DETECTED Final   Chlamydophila pneumoniae NOT DETECTED NOT DETECTED Final   Mycoplasma pneumoniae NOT DETECTED NOT DETECTED Final    Comment: Performed at [facility], [place] [address], [place], [place] [address]   Studies/Results: No results found.  Medications:  Prior to Admission:  Medications Prior to Admission  Medication Sig Dispense Refill Last Dose  . albuterol (PROVENTIL) (2.5 MG/3ML) 0.083% nebulizer solution Take 3 mLs (2.5 mg total) by nebulization every 6 (six) hours as needed for wheezing or shortness of breath. 75 mL 3 04/11/2017 at Unknown time  . albuterol (VENTOLIN HFA) 108 (90 Base) MCG/ACT inhaler Inhale 2 puffs into the lungs every 4 (four) hours as needed for wheezing or shortness of breath. 18 Inhaler 5 04/11/2017 at Unknown time  . aspirin EC 81 MG tablet Take 81 mg by mouth daily.   04/11/2017 at Unknown time  . fexofenadine-pseudoephedrine (ALLEGRA-D 24) 180-240 MG 24 hr tablet Take 1 tablet by mouth 2 (two) times daily.    04/11/2017 at Unknown time  . gabapentin (NEURONTIN) 300 MG capsule TAKE 1 CAPSULE BY MOUTH EVERYDAY AT BEDTIME 30 capsule 0 04/11/2017 at Unknown time  . hydroxyurea (HYDREA) 500 MG capsule TAKE 2 CAPSULES ON MON-TUES-WED-FRI-SAT. Take 1 po on thurs and sunday (Patient taking differently: Take 500-1,000 mg by mouth See admin instructions. TAKE 2 CAPSULES ON MON-TUES-WED-FRI-SAT. Take 1 po on thurs and sunday) 48 capsule 4 04/11/2017 at Unknown time  . ipratropium (ATROVENT) 0.02 % nebulizer solution Take 2.5 mLs (0.5 mg total) by nebulization every 6 (six) hours as needed for wheezing or shortness of breath. 75 mL 3 04/11/2017 at  Unknown time  . levofloxacin (LEVAQUIN) 750 MG tablet Take 1 tablet (750 mg total) by mouth daily. 7 tablet 0 04/11/2017 at 1600  . levothyroxine (SYNTHROID, LEVOTHROID) 112 MCG tablet TAKE 1 TABLET BY MOUTH EVERY DAY 90 tablet 0 04/11/2017 at Unknown time  . LORazepam (ATIVAN) 0.5 MG tablet Take 1 tablet (0.5 mg total) by mouth at bedtime as needed for anxiety or sleep. 30 tablet 0 Past Month at Unknown time  . metFORMIN (GLUCOPHAGE) 500 MG tablet TAKE 1 TABLET (500 MG TOTAL) BY MOUTH 2 (TWO) TIMES DAILY WITH A MEAL. 60 tablet 2 04/11/2017 at Unknown time  . metoprolol succinate (  TOPROL-XL) 25 MG 24 hr tablet TAKE 1 TABLET BY MOUTH EVERY DAY 90 tablet 0 04/11/2017 at 1200  . Multiple Vitamins-Minerals (ICAPS AREDS 2 PO) Take 1 capsule by mouth 2 (two) times daily.   04/11/2017 at Unknown time  . omeprazole (PRILOSEC) 20 MG capsule TAKE ONE CAPSULE BY MOUTH EVERY MORNING 30 MINUTES PRIOR TO YOUR FIRST MEAL 31 capsule 11 04/12/2017 at Unknown time  . PARoxetine (PAXIL) 20 MG tablet TAKE 1 TABLET BY MOUTH EVERY DAY 30 tablet 1 Past Week at Unknown time  . promethazine (PHENERGAN) 12.5 MG tablet Take 1 tablet (12.5 mg total) by mouth every 8 (eight) hours as needed for nausea or vomiting. 10 tablet 0 Past Month at Unknown time  . traZODone (DESYREL) 100 MG tablet Take 0.5-1 tablets (50-100 mg total) by mouth at bedtime. 30 tablet 3 UNKNOWN   Scheduled: . aspirin EC  81 mg Oral Daily  . azithromycin  500 mg Oral Daily  . budesonide (PULMICORT) nebulizer solution  0.25 mg Nebulization BID  . enoxaparin (LOVENOX) injection  40 mg Subcutaneous Q24H  . gabapentin  300 mg Oral QHS  . guaiFENesin  600 mg Oral BID  . hydroxyurea  1,000 mg Oral Once per day on Mon Tue Wed Fri Sat  . hydroxyurea  500 mg Oral Once per day on Sun Thu  . insulin aspart  0-9 Units Subcutaneous TID WC  . ipratropium-albuterol  3 mL Nebulization TID  . levothyroxine  112 mcg Oral QAC breakfast  . mouth rinse  15 mL Mouth Rinse BID  .  methylPREDNISolone (SOLU-MEDROL) injection  40 mg Intravenous Q12H  . metoprolol succinate  25 mg Oral Daily  . pantoprazole  40 mg Oral Daily  . PARoxetine  20 mg Oral Daily  . traZODone  100 mg Oral QHS   Continuous:  MNO:[id number], alum & mag hydroxide-simeth, ipratropium-albuterol, ondansetron **OR** ondansetron (ZOFRAN) IV  Assesment: She was admitted with COPD exacerbation.  She has acute on chronic hypoxic respiratory failure.  She is getting better.  She says she had trouble with her blood sugar last night. Principal Problem:   COPD exacerbation ([place]) Active Problems:   Asthma with COPD ([place])   Anxiety   Diabetes mellitus without complication ([place])   Hypertension   Hypothyroidism   Thrombocytosis ([facility])   COPD (chronic obstructive pulmonary disease) ([facility])   Respiratory distress   SOB (shortness of breath)   Palliative care encounter    Plan: Add flutter valve.  Continue other treatments.    LOS: 2 days   Aigner Horseman L 04/16/2017, 8:39 AM

## 2017-04-17 LAB — CULTURE, BLOOD (ROUTINE X 2)
CULTURE: NO GROWTH
Culture: NO GROWTH
SPECIAL REQUESTS: ADEQUATE
SPECIAL REQUESTS: ADEQUATE

## 2017-04-17 LAB — GLUCOSE, CAPILLARY
GLUCOSE-CAPILLARY: 99 mg/dL (ref 65–99)
Glucose-Capillary: 74 mg/dL (ref 65–99)
Glucose-Capillary: 151 mg/dL — ABNORMAL HIGH (ref 65–99)

## 2017-04-17 MED ORDER — PREDNISONE 10 MG PO TABS: ORAL_TABLET | ORAL | 0 refills | Status: DC

## 2017-04-17 MED ORDER — POLYETHYLENE GLYCOL 3350 17 G PO PACK: 17.0000 g | PACK | Freq: Every day | ORAL | 0 refills | Status: DC

## 2017-04-17 MED ORDER — GUAIFENESIN ER 600 MG PO TB12: 600.0000 mg | ORAL_TABLET | Freq: Two times a day (BID) | ORAL | 0 refills | Status: DC

## 2017-04-17 NOTE — Care Management Important Message (Signed)
Important Message  Patient Details  Name: Jordan [name] MRN: [id number] Date of Birth: 1940/12/08   Medicare Important Message Given:  Yes    Jeryn Cerney, Chauncey Reading, RN 04/17/2017, 12:59 PM

## 2017-04-17 NOTE — Discharge Summary (Signed)
Physician Discharge Summary  Jordan [name] QPY:[id number] DOB: [birth year] DOA: 04/12/2017  PCP: Terald Sleeper, PA-C  Admit date: 04/12/2017 Discharge date: 04/17/2017  Admitted From: Home Disposition: Home  Recommendations for Outpatient Follow-up:  1. Follow up with PCP in 1-2 weeks 2. Please obtain BMP/CBC in one week 3. She will follow-up with Dr. Luan Pulling as an outpatient.  Home Health: [facility] has been arranged.  She will be enrolled in COPD polyp program Equipment/Devices:  Discharge Condition: Stable CODE STATUS: DNR Diet recommendation: Heart Healthy / Carb Modified \  Brief/Interim Summary: 77 year old female with a history of oxygen dependent COPD, presented with worsening shortness of breath consistent with COPD exacerbation.  On IV steroids, bronchodilators and antibiotics.  Slowly improving.    Discharge Diagnoses:  Principal Problem:   COPD exacerbation ([place]) Active Problems:   Asthma with COPD ([place])   Anxiety   Diabetes mellitus without complication ([place])   Hypertension   Hypothyroidism   Thrombocytosis ([place])   COPD (chronic obstructive pulmonary disease) ([facility])   Respiratory distress   SOB (shortness of breath)   Palliative care encounter  1. Acute exacerbation of COPD.  A admitted with shortness of breath, wheezing, cough.  She was treated with intravenous Solu-Medrol, bronchodilators and antibiotics.  Breathing slowly improved and is now approaching baseline.  She will be transitioned to prednisone taper.  Seen by pulmonology and palliative care and she will be enrolled in COPD follow-up program. 2. Chronic respiratory failure with hypoxia due to COPD.    She is chronically on home oxygen at 2 L.  Respiratory status appears to be near baseline. 3. Mild AKI.    Treated with IV fluids and is now improved. 4. Anxiety/depression.    Exacerbated by steroids.  Clinically improving. 5. Diabetes.    Blood sugars remained stable in the  hospital. 6. Myeloproliferative disease/thrombocytosis.  Continue on home dose of Hydrea.     Discharge Instructions  Discharge Instructions    Diet - low sodium heart healthy   Complete by:  As directed    Increase activity slowly   Complete by:  As directed      Allergies as of 04/17/2017      Reactions   Bee Venom Anaphylaxis   Codeine Itching   Lipitor [atorvastatin]    Extreme Dry Mouth, joint pain   Penicillins Hives   Has patient had a PCN reaction causing immediate rash, facial/tongue/throat swelling, SOB or lightheadedness with hypotension: No Has patient had a PCN reaction causing severe rash involving mucus membranes or skin necrosis: Yes Has patient had a PCN reaction that required hospitalization: No Has patient had a PCN reaction occurring within the last 10 years: No If all of the above answers are "NO", then may proceed with Cephalosporin use.   Demeclocycline Rash   Morphine And Related Rash   Tetracyclines & Related Rash      Medication List    STOP taking these medications   levofloxacin 750 MG tablet Commonly known as:  LEVAQUIN     TAKE these medications   albuterol 108 (90 Base) MCG/ACT inhaler Commonly known as:  VENTOLIN HFA Inhale 2 puffs into the lungs every 4 (four) hours as needed for wheezing or shortness of breath.   albuterol (2.5 MG/3ML) 0.083% nebulizer solution Commonly known as:  PROVENTIL Take 3 mLs (2.5 mg total) by nebulization every 6 (six) hours as needed for wheezing or shortness of breath.   aspirin EC 81 MG tablet Take 81 mg by  mouth daily.   fexofenadine-pseudoephedrine 180-240 MG 24 hr tablet Commonly known as:  ALLEGRA-D 24 Take 1 tablet by mouth 2 (two) times daily.   gabapentin 300 MG capsule Commonly known as:  NEURONTIN TAKE 1 CAPSULE BY MOUTH EVERYDAY AT BEDTIME   guaiFENesin 600 MG 12 hr tablet Commonly known as:  MUCINEX Take 1 tablet (600 mg total) by mouth 2 (two) times daily.   hydroxyurea 500 MG  capsule Commonly known as:  HYDREA TAKE 2 CAPSULES ON MON-TUES-WED-FRI-SAT. Take 1 po on thurs and sunday What changed:    how much to take  how to take this  when to take this  additional instructions   ICAPS AREDS 2 PO Take 1 capsule by mouth 2 (two) times daily.   ipratropium 0.02 % nebulizer solution Commonly known as:  ATROVENT Take 2.5 mLs (0.5 mg total) by nebulization every 6 (six) hours as needed for wheezing or shortness of breath.   levothyroxine 112 MCG tablet Commonly known as:  SYNTHROID, LEVOTHROID TAKE 1 TABLET BY MOUTH EVERY DAY   LORazepam 0.5 MG tablet Commonly known as:  ATIVAN Take 1 tablet (0.5 mg total) by mouth at bedtime as needed for anxiety or sleep.   metFORMIN 500 MG tablet Commonly known as:  GLUCOPHAGE TAKE 1 TABLET (500 MG TOTAL) BY MOUTH 2 (TWO) TIMES DAILY WITH A MEAL.   metoprolol succinate 25 MG 24 hr tablet Commonly known as:  TOPROL-XL TAKE 1 TABLET BY MOUTH EVERY DAY   omeprazole 20 MG capsule Commonly known as:  PRILOSEC TAKE ONE CAPSULE BY MOUTH EVERY MORNING 30 MINUTES PRIOR TO YOUR FIRST MEAL   PARoxetine 20 MG tablet Commonly known as:  PAXIL TAKE 1 TABLET BY MOUTH EVERY DAY   polyethylene glycol packet Commonly known as:  MIRALAX / GLYCOLAX Take 17 g by mouth daily. Start taking on:  04/18/2017   predniSONE 10 MG tablet Commonly known as:  DELTASONE Take 40mg  po daily for 2 days then 30mg  daily for 2 days then 20mg  daily for 2 days then 10mg  daily for 2 days then stop   promethazine 12.5 MG tablet Commonly known as:  PHENERGAN Take 1 tablet (12.5 mg total) by mouth every 8 (eight) hours as needed for nausea or vomiting.   traZODone 100 MG tablet Commonly known as:  DESYREL Take 0.5-1 tablets (50-100 mg total) by mouth at bedtime.      Follow-up Information    Terald Sleeper, PA-C Follow up.   Specialties:  Physician Assistant, Family Medicine Why:  Call to schedule hospital follow-up in one week Contact  information: [place] [address] [phone]        Sinda Du, MD Follow up.   Specialty:  Pulmonary Disease Why:  Call to schedule hospital follow-up appointment 1 week Contact information: [address] [place] [address] [phone]          Allergies  Allergen Reactions  . Bee Venom Anaphylaxis  . Codeine Itching  . Lipitor [Atorvastatin]     Extreme Dry Mouth, joint pain  . Penicillins Hives    Has patient had a PCN reaction causing immediate rash, facial/tongue/throat swelling, SOB or lightheadedness with hypotension: No Has patient had a PCN reaction causing severe rash involving mucus membranes or skin necrosis: Yes Has patient had a PCN reaction that required hospitalization: No Has patient had a PCN reaction occurring within the last 10 years: No If all of the above answers are "NO",  then may proceed with Cephalosporin use.   . Demeclocycline Rash  . Morphine And Related Rash  . Tetracyclines & Related Rash    Consultations:  Pulmonology  Palliative care   Procedures/Studies: Dg Neck Soft Tissue  Result Date: 04/12/2017 CLINICAL DATA:  Pharyngitis for the past week. EXAM: NECK SOFT TISSUES - 1+ VIEW COMPARISON:  None. FINDINGS: Normal appearing airway and epiglottis. No prevertebral soft tissue swelling. Extensive cervical spine degenerative changes with mild reversal of the normal cervical lordosis. The patient is edentulous. Prominent interstitial markings in the visualized lungs. This appears chronic on a portable chest earlier today. IMPRESSION: No acute abnormality. 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 Electronically Signed   By: Claudie Revering M.D.   On: 04/12/2017 15:05   X-ray Chest Pa And Lateral  Result Date: 04/13/2017 CLINICAL DATA:  Shortness of breath and fevers and night for 1 week. EXAM: CHEST  2 VIEW COMPARISON:  PA and lateral chest 02/12/2017 and  12/18/2014. FINDINGS: The lungs are clear. Heart size is normal. No pneumothorax or pleural effusion. Aortic atherosclerosis is noted. No acute bony abnormality. Scoliosis noted. Calcified breast implants are in place. IMPRESSION: No acute disease. Atherosclerosis. Scoliosis. Electronically Signed   By: Inge Rise M.D.   On: 04/13/2017 10:19   Dg Chest Port 1 View  Result Date: 04/12/2017 CLINICAL DATA:  Cough, shortness of breath EXAM: PORTABLE CHEST 1 VIEW COMPARISON:  02/12/2017 FINDINGS: Lungs are essentially clear. Prominent pulmonary markings along the medial right heart border. No focal consolidation. No pleural effusion or pneumothorax. The heart is normal in size. IMPRESSION: No evidence of acute cardiopulmonary disease. Electronically Signed   By: Julian Hy M.D.   On: 04/12/2017 13:03       Subjective: Feeling better today.  Shortness of breath improving.  Wheezing improving.  Continues to have productive cough.  Discharge Exam: Vitals:   04/17/17 1414 04/17/17 1845  BP:    Pulse:    Resp:    Temp:    SpO2: 99% 98%   Vitals:   04/17/17 0930 04/17/17 1327 04/17/17 1414 04/17/17 1845  BP: 123/63 (!) 110/51    Pulse: 64 75    Resp: 16 18    Temp: 98.1 F (36.7 C) 97.8 F (36.6 C)    TempSrc: Oral Oral    SpO2: 99% 97% 99% 98%  Weight:      Height:        General: Pt is alert, awake, not in acute distress Cardiovascular: RRR, S1/S2 +, no rubs, no gallops Respiratory: Mild wheeze bilaterally Abdominal: Soft, NT, ND, bowel sounds + Extremities: no edema, no cyanosis    The results of significant diagnostics from this hospitalization (including imaging, microbiology, ancillary and laboratory) are listed below for reference.     Microbiology: Recent Results (from the past 240 hour(s))  Blood Culture (routine x 2)     Status: None   Collection Time: 04/12/17 12:15 PM  Result Value Ref Range Status   Specimen Description BLOOD LEFT HAND  Final    Special Requests   Final    BOTTLES DRAWN AEROBIC AND ANAEROBIC Blood Culture adequate volume   Culture   Final    NO GROWTH 5 DAYS Performed at [facility], [address] [place], [place] [address]    Report Status 04/17/2017 FINAL  Final  Blood Culture (routine x 2)     Status: None   Collection Time: 04/12/17 12:37 PM  Result Value Ref Range  Status   Specimen Description BLOOD RIGHT HAND  Final   Special Requests   Final    BOTTLES DRAWN AEROBIC AND ANAEROBIC Blood Culture adequate volume   Culture   Final    NO GROWTH 5 DAYS Performed at [facility] [address], [place], [place] [address]    Report Status 04/17/2017 FINAL  Final  Respiratory Panel by PCR     Status: None   Collection Time: 04/12/17  8:00 PM  Result Value Ref Range Status   Adenovirus NOT DETECTED NOT DETECTED Final   Coronavirus 229E NOT DETECTED NOT DETECTED Final   Coronavirus HKU1 NOT DETECTED NOT DETECTED Final   Coronavirus NL63 NOT DETECTED NOT DETECTED Final   Coronavirus OC43 NOT DETECTED NOT DETECTED Final   Metapneumovirus NOT DETECTED NOT DETECTED Final   Rhinovirus / Enterovirus NOT DETECTED NOT DETECTED Final   Influenza A NOT DETECTED NOT DETECTED Final   Influenza B NOT DETECTED NOT DETECTED Final   Parainfluenza Virus 1 NOT DETECTED NOT DETECTED Final   Parainfluenza Virus 2 NOT DETECTED NOT DETECTED Final   Parainfluenza Virus 3 NOT DETECTED NOT DETECTED Final   Parainfluenza Virus 4 NOT DETECTED NOT DETECTED Final   Respiratory Syncytial Virus NOT DETECTED NOT DETECTED Final   Bordetella pertussis NOT DETECTED NOT DETECTED Final   Chlamydophila pneumoniae NOT DETECTED NOT DETECTED Final   Mycoplasma pneumoniae NOT DETECTED NOT DETECTED Final    Comment: Performed at [facility], [place] [address] [place], [place] [address]     Labs: BNP (last 3 results) No results for input(s): BNP in the last 8760 hours. Basic Metabolic Panel: Recent Labs  Lab 04/12/17 1203  04/13/17 0551  NA 136 139  K 3.8 3.9  CL 99* 104  CO2 23 23  GLUCOSE 131* 209*  BUN 15 13  CREATININE 1.02* 0.83  CALCIUM 9.1 8.7*   Liver Function Tests: Recent Labs  Lab 04/12/17 1203  AST 18  ALT 9*  ALKPHOS 75  BILITOT 1.4*  PROT 7.4  ALBUMIN 4.0   No results for input(s): LIPASE, AMYLASE in the last 168 hours. No results for input(s): AMMONIA in the last 168 hours. CBC: Recent Labs  Lab 04/12/17 1203  WBC 9.0  NEUTROABS 6.6  HGB 13.1  HCT 40.9  MCV 111.1*  PLT 414*   Cardiac Enzymes: Recent Labs  Lab 04/12/17 1203  TROPONINI <0.03   BNP: Invalid input(s): POCBNP CBG: Recent Labs  Lab 04/16/17 1614 04/16/17 2021 04/17/17 0826 04/17/17 1202 04/17/17 1815  GLUCAP 178* 191* 99 74 151*   D-Dimer No results for input(s): DDIMER in the last 72 hours. Hgb A1c No results for input(s): HGBA1C in the last 72 hours. Lipid Profile No results for input(s): CHOL, HDL, LDLCALC, TRIG, CHOLHDL, LDLDIRECT in the last 72 hours. Thyroid function studies No results for input(s): TSH, T4TOTAL, T3FREE, THYROIDAB in the last 72 hours.  Invalid input(s): FREET3 Anemia work up No results for input(s): VITAMINB12, FOLATE, FERRITIN, TIBC, IRON, RETICCTPCT in the last 72 hours. Urinalysis    Component Value Date/Time   COLORURINE YELLOW 12/18/2014 1025   APPEARANCEUR Clear 09/11/2016 1707   LABSPEC 1.014 12/18/2014 1025   PHURINE 7.0 12/18/2014 1025   GLUCOSEU Negative 09/11/2016 1707   HGBUR NEGATIVE 12/18/2014 1025   BILIRUBINUR Negative 09/11/2016 1707   KETONESUR NEGATIVE 12/18/2014 1025   PROTEINUR Negative 09/11/2016 1707   PROTEINUR NEGATIVE 12/18/2014 1025   UROBILINOGEN 0.2 12/18/2014 1025   NITRITE Negative 09/11/2016  [place] 12/18/2014 1025   LEUKOCYTESUR Trace (A) 09/11/2016 1707   Sepsis Labs Invalid input(s): PROCALCITONIN,  WBC,  LACTICIDVEN Microbiology Recent Results (from the past 240 hour(s))  Blood Culture (routine x 2)      Status: None   Collection Time: 04/12/17 12:15 PM  Result Value Ref Range Status   Specimen Description BLOOD LEFT HAND  Final   Special Requests   Final    BOTTLES DRAWN AEROBIC AND ANAEROBIC Blood Culture adequate volume   Culture   Final    NO GROWTH 5 DAYS Performed at [facility], [address] [place], [place] [address]    Report Status 04/17/2017 FINAL  Final  Blood Culture (routine x 2)     Status: None   Collection Time: 04/12/17 12:37 PM  Result Value Ref Range Status   Specimen Description BLOOD RIGHT HAND  Final   Special Requests   Final    BOTTLES DRAWN AEROBIC AND ANAEROBIC Blood Culture adequate volume   Culture   Final    NO GROWTH 5 DAYS Performed at [facility], [address] [place], [place] [address]    Report Status 04/17/2017 FINAL  Final  Respiratory Panel by PCR     Status: None   Collection Time: 04/12/17  8:00 PM  Result Value Ref Range Status   Adenovirus NOT DETECTED NOT DETECTED Final   Coronavirus 229E NOT DETECTED NOT DETECTED Final   Coronavirus HKU1 NOT DETECTED NOT DETECTED Final   Coronavirus NL63 NOT DETECTED NOT DETECTED Final   Coronavirus OC43 NOT DETECTED NOT DETECTED Final   Metapneumovirus NOT DETECTED NOT DETECTED Final   Rhinovirus / Enterovirus NOT DETECTED NOT DETECTED Final   Influenza A NOT DETECTED NOT DETECTED Final   Influenza B NOT DETECTED NOT DETECTED Final   Parainfluenza Virus 1 NOT DETECTED NOT DETECTED Final   Parainfluenza Virus 2 NOT DETECTED NOT DETECTED Final   Parainfluenza Virus 3 NOT DETECTED NOT DETECTED Final   Parainfluenza Virus 4 NOT DETECTED NOT DETECTED Final   Respiratory Syncytial Virus NOT DETECTED NOT DETECTED Final   Bordetella pertussis NOT DETECTED NOT DETECTED Final   Chlamydophila pneumoniae NOT DETECTED NOT DETECTED Final   Mycoplasma pneumoniae NOT DETECTED NOT DETECTED Final    Comment: Performed at [facility], [place] [address], [place], [place] [address]     Time  coordinating discharge: Over 30 minutes  SIGNED:   Kathie Dike, MD  [facility] 04/17/2017, 9:37 PM Pager   If 7PM-7AM, please contact night-coverage [email or web address] Password [email or web address]

## 2017-04-17 NOTE — Progress Notes (Signed)
DISCHARGE INSTRUCTIONS EXPLAINED TO PATIENT AND HUSBAND ALONG  WITH APPOINTMENTS AND MEDICATIONS  AND WHEN TO TAKE THEM.  PATIENT WAS ABLE TO VERBALIZED UNDERSTANDING AND TEACH BACK WAS DONE. PATIENT HAD NO QUESTIONS AND VERBALIZED UNDERSTANDING OF INFORMATION INSTRUCTED.

## 2017-04-17 NOTE — Progress Notes (Signed)
She says she feels better.  She may be able to be discharged today.  Exam shows that her chest is clear with somewhat decreased breath sounds and prolonged expiratory phase but much better than earlier  She has COPD exacerbation.  She has acute on chronic hypoxic respiratory failure.  She has significant anxiety which complicates her treatment  From a pulmonary point of view she could be discharged.  We will see if she can be enrolled in our pilot program through home health

## 2017-04-18 DIAGNOSIS — Z9981 Dependence on supplemental oxygen: Secondary | ICD-10-CM | POA: Diagnosis not present

## 2017-04-18 DIAGNOSIS — F329 Major depressive disorder, single episode, unspecified: Secondary | ICD-10-CM | POA: Diagnosis not present

## 2017-04-18 DIAGNOSIS — I10 Essential (primary) hypertension: Secondary | ICD-10-CM | POA: Diagnosis not present

## 2017-04-18 DIAGNOSIS — J9611 Chronic respiratory failure with hypoxia: Secondary | ICD-10-CM | POA: Diagnosis not present

## 2017-04-18 DIAGNOSIS — D471 Chronic myeloproliferative disease: Secondary | ICD-10-CM | POA: Diagnosis not present

## 2017-04-18 DIAGNOSIS — Z8673 Personal history of transient ischemic attack (TIA), and cerebral infarction without residual deficits: Secondary | ICD-10-CM | POA: Diagnosis not present

## 2017-04-18 DIAGNOSIS — E1136 Type 2 diabetes mellitus with diabetic cataract: Secondary | ICD-10-CM | POA: Diagnosis not present

## 2017-04-18 DIAGNOSIS — M1990 Unspecified osteoarthritis, unspecified site: Secondary | ICD-10-CM | POA: Diagnosis not present

## 2017-04-18 DIAGNOSIS — F419 Anxiety disorder, unspecified: Secondary | ICD-10-CM | POA: Diagnosis not present

## 2017-04-18 DIAGNOSIS — J441 Chronic obstructive pulmonary disease with (acute) exacerbation: Secondary | ICD-10-CM | POA: Diagnosis not present

## 2017-04-18 DIAGNOSIS — E039 Hypothyroidism, unspecified: Secondary | ICD-10-CM | POA: Diagnosis not present

## 2017-04-18 DIAGNOSIS — Z7984 Long term (current) use of oral hypoglycemic drugs: Secondary | ICD-10-CM | POA: Diagnosis not present

## 2017-04-18 DIAGNOSIS — Z87891 Personal history of nicotine dependence: Secondary | ICD-10-CM | POA: Diagnosis not present

## 2017-04-21 DIAGNOSIS — F329 Major depressive disorder, single episode, unspecified: Secondary | ICD-10-CM | POA: Diagnosis not present

## 2017-04-21 DIAGNOSIS — D471 Chronic myeloproliferative disease: Secondary | ICD-10-CM | POA: Diagnosis not present

## 2017-04-21 DIAGNOSIS — I10 Essential (primary) hypertension: Secondary | ICD-10-CM | POA: Diagnosis not present

## 2017-04-21 DIAGNOSIS — J441 Chronic obstructive pulmonary disease with (acute) exacerbation: Secondary | ICD-10-CM | POA: Diagnosis not present

## 2017-04-21 DIAGNOSIS — E1136 Type 2 diabetes mellitus with diabetic cataract: Secondary | ICD-10-CM | POA: Diagnosis not present

## 2017-04-21 DIAGNOSIS — J9611 Chronic respiratory failure with hypoxia: Secondary | ICD-10-CM | POA: Diagnosis not present

## 2017-04-23 ENCOUNTER — Telehealth: Payer: Self-pay | Admitting: Physician Assistant

## 2017-04-23 DIAGNOSIS — J441 Chronic obstructive pulmonary disease with (acute) exacerbation: Secondary | ICD-10-CM | POA: Diagnosis not present

## 2017-04-23 DIAGNOSIS — J9611 Chronic respiratory failure with hypoxia: Secondary | ICD-10-CM | POA: Diagnosis not present

## 2017-04-23 DIAGNOSIS — F329 Major depressive disorder, single episode, unspecified: Secondary | ICD-10-CM | POA: Diagnosis not present

## 2017-04-23 DIAGNOSIS — I10 Essential (primary) hypertension: Secondary | ICD-10-CM | POA: Diagnosis not present

## 2017-04-23 DIAGNOSIS — D471 Chronic myeloproliferative disease: Secondary | ICD-10-CM | POA: Diagnosis not present

## 2017-04-23 DIAGNOSIS — E1136 Type 2 diabetes mellitus with diabetic cataract: Secondary | ICD-10-CM | POA: Diagnosis not present

## 2017-04-23 NOTE — Telephone Encounter (Signed)
appt scheduled Pt notified 

## 2017-04-24 ENCOUNTER — Ambulatory Visit: Payer: Medicare Other | Admitting: Physician Assistant

## 2017-04-24 DIAGNOSIS — J441 Chronic obstructive pulmonary disease with (acute) exacerbation: Secondary | ICD-10-CM | POA: Diagnosis not present

## 2017-04-24 DIAGNOSIS — D471 Chronic myeloproliferative disease: Secondary | ICD-10-CM | POA: Diagnosis not present

## 2017-04-24 DIAGNOSIS — J9611 Chronic respiratory failure with hypoxia: Secondary | ICD-10-CM | POA: Diagnosis not present

## 2017-04-24 DIAGNOSIS — E1136 Type 2 diabetes mellitus with diabetic cataract: Secondary | ICD-10-CM | POA: Diagnosis not present

## 2017-04-24 DIAGNOSIS — I10 Essential (primary) hypertension: Secondary | ICD-10-CM | POA: Diagnosis not present

## 2017-04-24 DIAGNOSIS — F329 Major depressive disorder, single episode, unspecified: Secondary | ICD-10-CM | POA: Diagnosis not present

## 2017-04-25 DIAGNOSIS — J9611 Chronic respiratory failure with hypoxia: Secondary | ICD-10-CM | POA: Diagnosis not present

## 2017-04-25 DIAGNOSIS — I10 Essential (primary) hypertension: Secondary | ICD-10-CM | POA: Diagnosis not present

## 2017-04-25 DIAGNOSIS — F329 Major depressive disorder, single episode, unspecified: Secondary | ICD-10-CM | POA: Diagnosis not present

## 2017-04-25 DIAGNOSIS — D471 Chronic myeloproliferative disease: Secondary | ICD-10-CM | POA: Diagnosis not present

## 2017-04-25 DIAGNOSIS — E1136 Type 2 diabetes mellitus with diabetic cataract: Secondary | ICD-10-CM | POA: Diagnosis not present

## 2017-04-25 DIAGNOSIS — J441 Chronic obstructive pulmonary disease with (acute) exacerbation: Secondary | ICD-10-CM | POA: Diagnosis not present

## 2017-04-27 ENCOUNTER — Other Ambulatory Visit: Payer: Self-pay | Admitting: *Deleted

## 2017-04-27 DIAGNOSIS — I10 Essential (primary) hypertension: Secondary | ICD-10-CM | POA: Diagnosis not present

## 2017-04-27 DIAGNOSIS — E1136 Type 2 diabetes mellitus with diabetic cataract: Secondary | ICD-10-CM | POA: Diagnosis not present

## 2017-04-27 DIAGNOSIS — D471 Chronic myeloproliferative disease: Secondary | ICD-10-CM | POA: Diagnosis not present

## 2017-04-27 DIAGNOSIS — F329 Major depressive disorder, single episode, unspecified: Secondary | ICD-10-CM | POA: Diagnosis not present

## 2017-04-27 DIAGNOSIS — J441 Chronic obstructive pulmonary disease with (acute) exacerbation: Secondary | ICD-10-CM | POA: Diagnosis not present

## 2017-04-27 DIAGNOSIS — J9611 Chronic respiratory failure with hypoxia: Secondary | ICD-10-CM | POA: Diagnosis not present

## 2017-04-27 NOTE — Patient Outreach (Signed)
[facility]) Care Management  04/27/2017  Parrie Rasco 02-Aug-1940 [id number]   EMMI- General Discharge RED ON EMMI ALERT DAY#: 4 DATE: 04/24/17 RED ALERT: Lost interest int things? Yes Sad/hopeless/anxiouse/enpty? Yes  Outreach attempt #1 to patient. No answer. RN CM left HIPAA compliant message along with contact info.     Plan: RN CM will contact patient within one week.    [name], RN, BSN, MHA/MSL, [place] Telephonic Care Manager Coordinator [facility] Direct Phone: [phone] Toll Free: [phone] Fax: [phone]

## 2017-04-28 ENCOUNTER — Encounter: Payer: Self-pay | Admitting: Physician Assistant

## 2017-04-28 ENCOUNTER — Ambulatory Visit (INDEPENDENT_AMBULATORY_CARE_PROVIDER_SITE_OTHER): Payer: Medicare Other | Admitting: Physician Assistant

## 2017-04-28 ENCOUNTER — Other Ambulatory Visit: Payer: Self-pay | Admitting: *Deleted

## 2017-04-28 VITALS — BP 116/65 | HR 60 | Temp 96.6°F | Ht 66.0 in | Wt 184.0 lb

## 2017-04-28 DIAGNOSIS — F419 Anxiety disorder, unspecified: Secondary | ICD-10-CM

## 2017-04-28 DIAGNOSIS — F339 Major depressive disorder, recurrent, unspecified: Secondary | ICD-10-CM

## 2017-04-28 DIAGNOSIS — J9611 Chronic respiratory failure with hypoxia: Secondary | ICD-10-CM | POA: Diagnosis not present

## 2017-04-28 DIAGNOSIS — J449 Chronic obstructive pulmonary disease, unspecified: Secondary | ICD-10-CM | POA: Diagnosis not present

## 2017-04-28 DIAGNOSIS — E119 Type 2 diabetes mellitus without complications: Secondary | ICD-10-CM | POA: Diagnosis not present

## 2017-04-28 DIAGNOSIS — I10 Essential (primary) hypertension: Secondary | ICD-10-CM | POA: Diagnosis not present

## 2017-04-28 MED ORDER — LORAZEPAM 0.5 MG PO TABS: 0.5000 mg | ORAL_TABLET | Freq: Every evening | ORAL | 2 refills | Status: DC | PRN

## 2017-04-28 MED ORDER — GABAPENTIN 300 MG PO CAPS: ORAL_CAPSULE | ORAL | 11 refills | Status: DC

## 2017-04-28 MED ORDER — PAROXETINE HCL 40 MG PO TABS: 20.0000 mg | ORAL_TABLET | Freq: Every day | ORAL | 5 refills | Status: DC

## 2017-04-28 NOTE — Patient Instructions (Signed)
In a few days you may receive a survey in the mail or online from [employer] regarding your visit with us today. Please take a moment to fill this out. Your feedback is very important to our whole office. It can help us better understand your needs as well as improve your experience and satisfaction. Thank you for taking your time to complete it. We care about you.  Vondra Aldredge, PA-C  

## 2017-04-28 NOTE — Progress Notes (Signed)
BP 116/65   Pulse 60   Temp (!) 96.6 F (35.9 C) (Oral)   Ht 5\' 6"  (1.676 m)   Wt 184 lb (83.5 kg)   SpO2 98%   BMI 29.70 kg/m    Subjective:    Patient ID: Jordan [name], female    DOB: 09/18/1940, 77 y.o.   MRN: [id number]  HPI: Jordan [name] is a 77 y.o. female presenting on 04/28/2017 for Hospitalization Follow-up Jordan [name] for 6 days )  Patient comes in for recheck from her hospitalization for a COPD exacerbation.  She states she feels better than she has in a long time with breathing.  Some changes have been made by the pulmonologist.  She is following up with him.  She is having some extra depression and anxiety.  She would like to have those medications adjusted.  She has been on Paxil 20 mg for a long time.  Past Medical History:  Diagnosis Date  . Anxiety   . Arthritis   . Cancer [facility])    Skin cancer  . Cataracts, both eyes 05/2014  . COPD (chronic obstructive pulmonary disease) (HCC)    intermittent home O2 - rarely uses  . Depression   . Diabetes mellitus without complication ([place])   . GERD (gastroesophageal reflux disease)   . Hyperkalemia   . Hyperlipidemia   . Hypertension   . Seizures ([place])    From RH Negative blood when pregnant  . Stroke ([place])   . Thrombocytosis ([place])   . Thyroid disease    Relevant past medical, surgical, family and social history reviewed and updated as indicated. Interim medical history since our last visit reviewed. Allergies and medications reviewed and updated. DATA REVIEWED: CHART IN EPIC  Family History reviewed for pertinent findings.  Review of Systems  Constitutional: Positive for fatigue. Negative for activity change and fever.  HENT: Negative.   Eyes: Negative.   Respiratory: Positive for shortness of breath. Negative for cough and wheezing.   Cardiovascular: Negative.  Negative for chest pain.  Gastrointestinal: Negative.  Negative for abdominal pain.  Endocrine: Negative.   Genitourinary: Negative.  Negative for  dysuria.  Musculoskeletal: Negative.   Skin: Negative.   Neurological: Negative.   Psychiatric/Behavioral: Positive for dysphoric mood. The patient is nervous/anxious.     Allergies as of 04/28/2017      Reactions   Bee Venom Anaphylaxis   Codeine Itching   Lipitor [atorvastatin]    Extreme Dry Mouth, joint pain   Penicillins Hives   Has patient had a PCN reaction causing immediate rash, facial/tongue/throat swelling, SOB or lightheadedness with hypotension: No Has patient had a PCN reaction causing severe rash involving mucus membranes or skin necrosis: Yes Has patient had a PCN reaction that required hospitalization: No Has patient had a PCN reaction occurring within the last 10 years: No If all of the above answers are "NO", then may proceed with Cephalosporin use.   Demeclocycline Rash   Morphine And Related Rash   Tetracyclines & Related Rash      Medication List        Accurate as of 04/28/17 10:28 AM. Always use your most recent med list.          albuterol 108 (90 Base) MCG/ACT inhaler Commonly known as:  VENTOLIN HFA Inhale 2 puffs into the lungs every 4 (four) hours as needed for wheezing or shortness of breath.   albuterol (2.5 MG/3ML) 0.083% nebulizer solution Commonly known as:  PROVENTIL Take 3 mLs (2.5  mg total) by nebulization every 6 (six) hours as needed for wheezing or shortness of breath.   aspirin EC 81 MG tablet Take 81 mg by mouth daily.   fexofenadine-pseudoephedrine 180-240 MG 24 hr tablet Commonly known as:  ALLEGRA-D 24 Take 1 tablet by mouth 2 (two) times daily.   gabapentin 300 MG capsule Commonly known as:  NEURONTIN TAKE 1 CAPSULE BY MOUTH EVERYDAY AT BEDTIME   guaiFENesin 600 MG 12 hr tablet Commonly known as:  MUCINEX Take 1 tablet (600 mg total) by mouth 2 (two) times daily.   hydroxyurea 500 MG capsule Commonly known as:  HYDREA TAKE 2 CAPSULES ON MON-TUES-WED-FRI-SAT. Take 1 po on thurs and sunday   ICAPS AREDS 2 PO Take 1  capsule by mouth 2 (two) times daily.   ipratropium 0.02 % nebulizer solution Commonly known as:  ATROVENT Take 2.5 mLs (0.5 mg total) by nebulization every 6 (six) hours as needed for wheezing or shortness of breath.   levothyroxine 112 MCG tablet Commonly known as:  SYNTHROID, LEVOTHROID TAKE 1 TABLET BY MOUTH EVERY DAY   LORazepam 0.5 MG tablet Commonly known as:  ATIVAN Take 1 tablet (0.5 mg total) by mouth at bedtime as needed for anxiety or sleep.   metFORMIN 500 MG tablet Commonly known as:  GLUCOPHAGE TAKE 1 TABLET (500 MG TOTAL) BY MOUTH 2 (TWO) TIMES DAILY WITH A MEAL.   metoprolol succinate 25 MG 24 hr tablet Commonly known as:  TOPROL-XL TAKE 1 TABLET BY MOUTH EVERY DAY   omeprazole 20 MG capsule Commonly known as:  PRILOSEC TAKE ONE CAPSULE BY MOUTH EVERY MORNING 30 MINUTES PRIOR TO YOUR FIRST MEAL   OXYGEN Inhale 2 L into the lungs.   PARoxetine 40 MG tablet Commonly known as:  PAXIL Take 0.5 tablets (20 mg total) by mouth daily.   polyethylene glycol packet Commonly known as:  MIRALAX / GLYCOLAX Take 17 g by mouth daily.   promethazine 12.5 MG tablet Commonly known as:  PHENERGAN Take 1 tablet (12.5 mg total) by mouth every 8 (eight) hours as needed for nausea or vomiting.   traZODone 100 MG tablet Commonly known as:  DESYREL Take 0.5-1 tablets (50-100 mg total) by mouth at bedtime.          Objective:    BP 116/65   Pulse 60   Temp (!) 96.6 F (35.9 C) (Oral)   Ht 5\' 6"  (1.676 m)   Wt 184 lb (83.5 kg)   SpO2 98%   BMI 29.70 kg/m   Allergies  Allergen Reactions  . Bee Venom Anaphylaxis  . Codeine Itching  . Lipitor [Atorvastatin]     Extreme Dry Mouth, joint pain  . Penicillins Hives    Has patient had a PCN reaction causing immediate rash, facial/tongue/throat swelling, SOB or lightheadedness with hypotension: No Has patient had a PCN reaction causing severe rash involving mucus membranes or skin necrosis: Yes Has patient had a PCN  reaction that required hospitalization: No Has patient had a PCN reaction occurring within the last 10 years: No If all of the above answers are "NO", then may proceed with Cephalosporin use.   . Demeclocycline Rash  . Morphine And Related Rash  . Tetracyclines & Related Rash    Wt Readings from Last 3 Encounters:  04/28/17 184 lb (83.5 kg)  04/12/17 172 lb 6.4 oz (78.2 kg)  04/10/17 173 lb 3.2 oz (78.6 kg)    Physical Exam  Constitutional: She is oriented to person, place,  and time. She appears well-developed and well-nourished.  HENT:  Head: Normocephalic and atraumatic.  Right Ear: Tympanic membrane, external ear and ear canal normal.  Left Ear: Tympanic membrane, external ear and ear canal normal.  Nose: Nose normal. No rhinorrhea.  Mouth/Throat: Oropharynx is clear and moist and mucous membranes are normal. No oropharyngeal exudate or posterior oropharyngeal erythema.  Eyes: Conjunctivae and EOM are normal. Pupils are equal, round, and reactive to light.  Neck: Normal range of motion. Neck supple.  Cardiovascular: Normal rate, regular rhythm, normal heart sounds and intact distal pulses.  Pulmonary/Chest: Effort normal and breath sounds normal.  Abdominal: Soft. Bowel sounds are normal.  Neurological: She is alert and oriented to person, place, and time. She has normal reflexes.  Skin: Skin is warm and dry. No rash noted.  Psychiatric: She has a normal mood and affect. Her behavior is normal. Judgment and thought content normal.        Assessment & Plan:   1. Chronic obstructive pulmonary disease, unspecified COPD type ([place]) - OXYGEN; Inhale 2 L into the lungs. DNRcompleted and given to patient  2. Depression, recurrent ([place]) - PARoxetine (PAXIL) 40 MG tablet; Take 0.5 tablets (20 mg total) by mouth daily.  Dispense: 30 tablet; Refill: 5  3. Anxiety - LORazepam (ATIVAN) 0.5 MG tablet; Take 1 tablet (0.5 mg total) by mouth at bedtime as needed for anxiety or sleep.   Dispense: 30 tablet; Refill: 2   Continue all other maintenance medications as listed above.  Follow up plan: Return in about 4 weeks (around 05/26/2017) for recheck.  Educational handout given for [place] PA-C [place] [address]  [place], [place] [address] [phone]   04/28/2017, 10:28 AM

## 2017-04-29 ENCOUNTER — Ambulatory Visit: Payer: Medicare Other | Admitting: Acute Care

## 2017-04-29 ENCOUNTER — Encounter: Payer: Self-pay | Admitting: *Deleted

## 2017-04-29 ENCOUNTER — Ambulatory Visit (INDEPENDENT_AMBULATORY_CARE_PROVIDER_SITE_OTHER): Payer: Medicare Other

## 2017-04-29 DIAGNOSIS — J9611 Chronic respiratory failure with hypoxia: Secondary | ICD-10-CM | POA: Diagnosis not present

## 2017-04-29 DIAGNOSIS — E1136 Type 2 diabetes mellitus with diabetic cataract: Secondary | ICD-10-CM

## 2017-04-29 DIAGNOSIS — J441 Chronic obstructive pulmonary disease with (acute) exacerbation: Secondary | ICD-10-CM

## 2017-04-29 DIAGNOSIS — F329 Major depressive disorder, single episode, unspecified: Secondary | ICD-10-CM | POA: Diagnosis not present

## 2017-04-29 DIAGNOSIS — D471 Chronic myeloproliferative disease: Secondary | ICD-10-CM

## 2017-04-29 DIAGNOSIS — I10 Essential (primary) hypertension: Secondary | ICD-10-CM | POA: Diagnosis not present

## 2017-04-29 NOTE — Patient Outreach (Signed)
Jordan [name] [name]) Care Management 04/28/2017  Jordan [name] May 24, 1940 [id number]  EMMI- General Discharge RED ON EMMI ALERT DAY#: 4 DATE: 04/24/17 RED ALERT: Lost interest int things? Yes Sad/hopeless/anxiouse/empty? Yes  Outgoing telephone call to patient. HIPAA identifiers verified x's 2. Spoke with patient. Reviewed and addressed red alert. Patient had lost interest in doing things. She had feelings of sad/hopeless/anxious/empty. Patient was depressed due to the doctors sending her home from [name] in 02/2017 without any "hope", per patient. Patient confirmed having a history of end stage COPD. She is dependent on Oxygen. Patient stated, she was almost lifeless before she was re-admitted back to [name] on 04/12/17. Patient believes this [name] admission made a difference in her being alive today. Patient stated, she is feeling better now.   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 Patient stated, she is at risk for having a stroke at any time. She is prescribed an oral chemotherapy drug for the duration of her life. She takes one chemotherapy pill on Tuesday and Sunday and 2 pills on the other days, per patient. Patient verbalized having a DNR out of facility sheet. She is active with [name]) COPD program. She stated, she is the second person to enroll in the program. Jordan [name] is preparing patient to currently on transitioning to hospice when at the appropriate time. Patient believes the program is effective and helping her mentally. Patient stated, she is participating with PT to increase her upper and lower body strength. She is in the process of getting a personal care aide. She stated other disciplines have visited from [name] (SW/RT/OT/RN). Per patient, her family is very supportive with her medical care.   Patient reported, she had a visit with her primary MD today. She stated, her visit went well. She  discussed how she much appreciates Jordan [name], Physician Assistant (PA). During patient's MD, her Paxil was increased from 20 mg to 40 mg. Patient reported, she has difficulty with affording her medications. She stated, the SW with [name] stated she qualifies for medication assistance. Patient stated, she was told it may be 9 months before she receives assistance. Patient stated, she is unable to afford the inhalers.  [name] services and benefits explained to patient. Patient agreed to services.   Plan: RN CM will send patient EMMI educational materials on End stage COPD.  RN CM advised patient to contact RNCM for any needs or concerns. RN CM will send [name] pharmacy referral for affording medications. RM CM will send successful outreach letter to patient.  Jordan Bells, RN, BSN, MHA/MSL, [place] Telephonic Care Manager Coordinator [facility] Direct Phone: [phone] Cell Phone: [phone] Toll Free: [phone] Fax: [phone]

## 2017-04-30 DIAGNOSIS — E1136 Type 2 diabetes mellitus with diabetic cataract: Secondary | ICD-10-CM | POA: Diagnosis not present

## 2017-04-30 DIAGNOSIS — D471 Chronic myeloproliferative disease: Secondary | ICD-10-CM | POA: Diagnosis not present

## 2017-04-30 DIAGNOSIS — J9611 Chronic respiratory failure with hypoxia: Secondary | ICD-10-CM | POA: Diagnosis not present

## 2017-04-30 DIAGNOSIS — I10 Essential (primary) hypertension: Secondary | ICD-10-CM | POA: Diagnosis not present

## 2017-04-30 DIAGNOSIS — F329 Major depressive disorder, single episode, unspecified: Secondary | ICD-10-CM | POA: Diagnosis not present

## 2017-04-30 DIAGNOSIS — J441 Chronic obstructive pulmonary disease with (acute) exacerbation: Secondary | ICD-10-CM | POA: Diagnosis not present

## 2017-05-01 ENCOUNTER — Other Ambulatory Visit: Payer: Self-pay | Admitting: Family Medicine

## 2017-05-01 ENCOUNTER — Other Ambulatory Visit: Payer: Self-pay | Admitting: Physician Assistant

## 2017-05-01 ENCOUNTER — Telehealth: Payer: Self-pay | Admitting: Physician Assistant

## 2017-05-01 DIAGNOSIS — I10 Essential (primary) hypertension: Secondary | ICD-10-CM | POA: Diagnosis not present

## 2017-05-01 DIAGNOSIS — D471 Chronic myeloproliferative disease: Secondary | ICD-10-CM | POA: Diagnosis not present

## 2017-05-01 DIAGNOSIS — J9611 Chronic respiratory failure with hypoxia: Secondary | ICD-10-CM | POA: Diagnosis not present

## 2017-05-01 DIAGNOSIS — J441 Chronic obstructive pulmonary disease with (acute) exacerbation: Secondary | ICD-10-CM | POA: Diagnosis not present

## 2017-05-01 DIAGNOSIS — F339 Major depressive disorder, recurrent, unspecified: Secondary | ICD-10-CM

## 2017-05-01 DIAGNOSIS — E1136 Type 2 diabetes mellitus with diabetic cataract: Secondary | ICD-10-CM | POA: Diagnosis not present

## 2017-05-01 DIAGNOSIS — F329 Major depressive disorder, single episode, unspecified: Secondary | ICD-10-CM | POA: Diagnosis not present

## 2017-05-01 MED ORDER — PAROXETINE HCL 40 MG PO TABS: 40.0000 mg | ORAL_TABLET | Freq: Every day | ORAL | 5 refills | Status: DC

## 2017-05-01 NOTE — Telephone Encounter (Signed)
Jordan [name] called stating that paxil was to be increased to 40 mg daily but rx states to take half a tablet daily.  What is the correct dose

## 2017-05-01 NOTE — Telephone Encounter (Signed)
Jordan [name] aware that corrected Rx has been sent to the pharmacy

## 2017-05-01 NOTE — Telephone Encounter (Signed)
Corrected and sent

## 2017-05-05 ENCOUNTER — Other Ambulatory Visit: Payer: Self-pay | Admitting: Physician Assistant

## 2017-05-05 ENCOUNTER — Other Ambulatory Visit: Payer: Self-pay | Admitting: Pharmacist

## 2017-05-05 DIAGNOSIS — D471 Chronic myeloproliferative disease: Secondary | ICD-10-CM | POA: Diagnosis not present

## 2017-05-05 DIAGNOSIS — E1136 Type 2 diabetes mellitus with diabetic cataract: Secondary | ICD-10-CM | POA: Diagnosis not present

## 2017-05-05 DIAGNOSIS — I10 Essential (primary) hypertension: Secondary | ICD-10-CM | POA: Diagnosis not present

## 2017-05-05 DIAGNOSIS — J9611 Chronic respiratory failure with hypoxia: Secondary | ICD-10-CM | POA: Diagnosis not present

## 2017-05-05 DIAGNOSIS — J441 Chronic obstructive pulmonary disease with (acute) exacerbation: Secondary | ICD-10-CM | POA: Diagnosis not present

## 2017-05-05 DIAGNOSIS — F329 Major depressive disorder, single episode, unspecified: Secondary | ICD-10-CM | POA: Diagnosis not present

## 2017-05-05 NOTE — Telephone Encounter (Signed)
Spoke with daughter and she is aware that rx was sent in and it just can not be filled until 05/11/17.

## 2017-05-05 NOTE — Patient Outreach (Signed)
[place] [facility]) Care Management  05/05/2017  Jordan [name] September 06, 1940 [id number]   Patient was called regarding medication assistance. HIPAA identifiers were obtained. Prior to being able to review the medications, the patient asked me to call her back later as her occupational therapist was there and she had another visit the morning as well.  Plan: Call the patient tomorrow.  Elayne Guerin, PharmD, [place] Clinical Pharmacist [phone]

## 2017-05-06 ENCOUNTER — Encounter: Payer: Self-pay | Admitting: Pharmacist

## 2017-05-06 ENCOUNTER — Other Ambulatory Visit: Payer: Self-pay | Admitting: Pharmacist

## 2017-05-07 DIAGNOSIS — D471 Chronic myeloproliferative disease: Secondary | ICD-10-CM | POA: Diagnosis not present

## 2017-05-07 DIAGNOSIS — J9611 Chronic respiratory failure with hypoxia: Secondary | ICD-10-CM | POA: Diagnosis not present

## 2017-05-07 DIAGNOSIS — I10 Essential (primary) hypertension: Secondary | ICD-10-CM | POA: Diagnosis not present

## 2017-05-07 DIAGNOSIS — E1136 Type 2 diabetes mellitus with diabetic cataract: Secondary | ICD-10-CM | POA: Diagnosis not present

## 2017-05-07 DIAGNOSIS — F329 Major depressive disorder, single episode, unspecified: Secondary | ICD-10-CM | POA: Diagnosis not present

## 2017-05-07 DIAGNOSIS — J441 Chronic obstructive pulmonary disease with (acute) exacerbation: Secondary | ICD-10-CM | POA: Diagnosis not present

## 2017-05-07 NOTE — Patient Outreach (Signed)
[place] [facility]) Care Management  05/07/2017  Brodie Scovell 1940/10/05 [id number]   Patient was called regarding medication assistance. HIPAA identifiers were obtained. When asked about her medications, patient said she did not know about her medications and asked me to call her daughter.  Patient's daughter [name]) was called. She did not answer the phone and a HIPAA compliant message was left on her voicemail.  Plan: Call patient's daughter back.   Elayne Guerin, PharmD, [place] Clinical Pharmacist [phone]

## 2017-05-08 ENCOUNTER — Other Ambulatory Visit: Payer: Self-pay | Admitting: Pharmacist

## 2017-05-08 DIAGNOSIS — F329 Major depressive disorder, single episode, unspecified: Secondary | ICD-10-CM | POA: Diagnosis not present

## 2017-05-08 DIAGNOSIS — E1136 Type 2 diabetes mellitus with diabetic cataract: Secondary | ICD-10-CM | POA: Diagnosis not present

## 2017-05-08 DIAGNOSIS — J9611 Chronic respiratory failure with hypoxia: Secondary | ICD-10-CM | POA: Diagnosis not present

## 2017-05-08 DIAGNOSIS — I10 Essential (primary) hypertension: Secondary | ICD-10-CM | POA: Diagnosis not present

## 2017-05-08 DIAGNOSIS — D471 Chronic myeloproliferative disease: Secondary | ICD-10-CM | POA: Diagnosis not present

## 2017-05-08 DIAGNOSIS — J441 Chronic obstructive pulmonary disease with (acute) exacerbation: Secondary | ICD-10-CM | POA: Diagnosis not present

## 2017-05-08 NOTE — Patient Outreach (Addendum)
[place] [facility]) Care Management  05/08/2017  Jordan [name] 10-14-1940 [id number]   Patient's daughter was called back HIPAA identifiers were obtained.  After review of the referral, it became clear the patient expressed concern over affording "inhalers".  The only inhaler on the patient's profile was Ventolin HFA.    Patient's daughter said patient had a prescription written for her by Dr. Luan Pulling for Trelegy Ellipta and was told it would be >$300.  She did not have time to talk and requested that I investigate and leave her a voice mail with my findings.  [place], [place] were all called on the patient's behalf.  There has not been a prescription filled for Trelegy at [facility] or [facility] and there are no claims according to [place] (the patient's Medicare Part D) insurer.  However, when a prescription is filled, the patient should have a $42 copay for Trelegy as it is a tier 3 medication on their formulary and is NOT subject to the $100 deductible for tier 4 & 5 medications.  A message was left on the patient's daughter's voicemail as instructed with the findings.  I was unable to review the patient's medications as the patient was not able to do it herself (due to her memory) and asked me to call her daughter, Jordan [name].  Elisa was working at the time of my call and asked that I gather the information about the medication cost and leave her a voicemail.  Plan:  Call patient's daughter back within 3-5 business days  to be sure she received my message regarding medication cost left on her voicemail per request.   Elayne Guerin, PharmD, [place] Clinical Pharmacist [phone]

## 2017-05-09 DIAGNOSIS — I10 Essential (primary) hypertension: Secondary | ICD-10-CM | POA: Diagnosis not present

## 2017-05-09 DIAGNOSIS — F329 Major depressive disorder, single episode, unspecified: Secondary | ICD-10-CM | POA: Diagnosis not present

## 2017-05-09 DIAGNOSIS — E1136 Type 2 diabetes mellitus with diabetic cataract: Secondary | ICD-10-CM | POA: Diagnosis not present

## 2017-05-09 DIAGNOSIS — J9611 Chronic respiratory failure with hypoxia: Secondary | ICD-10-CM | POA: Diagnosis not present

## 2017-05-09 DIAGNOSIS — J441 Chronic obstructive pulmonary disease with (acute) exacerbation: Secondary | ICD-10-CM | POA: Diagnosis not present

## 2017-05-09 DIAGNOSIS — D471 Chronic myeloproliferative disease: Secondary | ICD-10-CM | POA: Diagnosis not present

## 2017-05-11 ENCOUNTER — Other Ambulatory Visit: Payer: Self-pay | Admitting: Pharmacist

## 2017-05-11 ENCOUNTER — Ambulatory Visit: Payer: Self-pay | Admitting: Pharmacist

## 2017-05-11 DIAGNOSIS — F329 Major depressive disorder, single episode, unspecified: Secondary | ICD-10-CM | POA: Diagnosis not present

## 2017-05-11 DIAGNOSIS — E1136 Type 2 diabetes mellitus with diabetic cataract: Secondary | ICD-10-CM | POA: Diagnosis not present

## 2017-05-11 DIAGNOSIS — D471 Chronic myeloproliferative disease: Secondary | ICD-10-CM | POA: Diagnosis not present

## 2017-05-11 DIAGNOSIS — I10 Essential (primary) hypertension: Secondary | ICD-10-CM | POA: Diagnosis not present

## 2017-05-11 DIAGNOSIS — J9611 Chronic respiratory failure with hypoxia: Secondary | ICD-10-CM | POA: Diagnosis not present

## 2017-05-11 DIAGNOSIS — J441 Chronic obstructive pulmonary disease with (acute) exacerbation: Secondary | ICD-10-CM | POA: Diagnosis not present

## 2017-05-11 NOTE — Patient Outreach (Signed)
[place] [facility]) Care Management  05/11/2017  Alsace Dowd April 23, 1940 [id number]   Patient's daughter was called regarding medication assistance with Trellegy. Unfortunately, she did not answer. Left HIPAA compliant voicemail.    Plan:  Call daughter back tomorrow per protocol.    Elayne Guerin, PharmD, [place] Clinical Pharmacist [phone]

## 2017-05-12 ENCOUNTER — Other Ambulatory Visit: Payer: Self-pay | Admitting: Pharmacist

## 2017-05-12 DIAGNOSIS — J9611 Chronic respiratory failure with hypoxia: Secondary | ICD-10-CM | POA: Diagnosis not present

## 2017-05-12 DIAGNOSIS — F329 Major depressive disorder, single episode, unspecified: Secondary | ICD-10-CM | POA: Diagnosis not present

## 2017-05-12 DIAGNOSIS — I10 Essential (primary) hypertension: Secondary | ICD-10-CM | POA: Diagnosis not present

## 2017-05-12 DIAGNOSIS — J441 Chronic obstructive pulmonary disease with (acute) exacerbation: Secondary | ICD-10-CM | POA: Diagnosis not present

## 2017-05-12 DIAGNOSIS — D471 Chronic myeloproliferative disease: Secondary | ICD-10-CM | POA: Diagnosis not present

## 2017-05-12 DIAGNOSIS — E1136 Type 2 diabetes mellitus with diabetic cataract: Secondary | ICD-10-CM | POA: Diagnosis not present

## 2017-05-12 NOTE — Patient Outreach (Signed)
[name]) Care Management  05/12/2017  Jordan [name] 1941/02/03 [id number]   Patient was called regarding medication assistance.  HIPAA identifiers were obtained. Patient is a 77 year old female with multiple medical conditions including but not limited to:  Anxiety, anemia, COPD, depression type 2 diabetes, GERD, hyperlipidemia, hypertension, hypothyroidism, seasonal allergies, and vocal cord atrophy.  Patient's daughter expressed concern over the price of Trelegy. However, after calling the patient's provider, pharmacy and Medicare Part D provider the following information was discovered:  There has not been a prescription filled for Trelegy at [facility] or [facility] and there are no claims according to [place] (the patient's Medicare Part D) insurer.  However, when a prescription is filled, the patient should have a $42 copay for Trelegy as it is a tier 3 medication on their formulary and is NOT subject to the $100 deductible for tier 4 & 5 medications.   Patient confirmed she did not take the prescription to the pharmacy and said her daughter had the prescription in her possession.  Multiple messages have been left for the patient's daughter.  Patient communicated understanding and said she would relay the message to her daughter.    Medication review was not able to be completed as the patient said her daughter manages her medications and her daughter has not had time to complete the review during either of our phone conversations and has not called back.    Plan: Close pharmacy case. Route note to patient's PCP  Send patient a closure letter.

## 2017-05-13 ENCOUNTER — Other Ambulatory Visit: Payer: Self-pay | Admitting: Physician Assistant

## 2017-05-13 ENCOUNTER — Other Ambulatory Visit (HOSPITAL_COMMUNITY): Payer: Self-pay

## 2017-05-13 DIAGNOSIS — F329 Major depressive disorder, single episode, unspecified: Secondary | ICD-10-CM | POA: Diagnosis not present

## 2017-05-13 DIAGNOSIS — E1136 Type 2 diabetes mellitus with diabetic cataract: Secondary | ICD-10-CM | POA: Diagnosis not present

## 2017-05-13 DIAGNOSIS — J9611 Chronic respiratory failure with hypoxia: Secondary | ICD-10-CM | POA: Diagnosis not present

## 2017-05-13 DIAGNOSIS — D471 Chronic myeloproliferative disease: Secondary | ICD-10-CM | POA: Diagnosis not present

## 2017-05-13 DIAGNOSIS — J441 Chronic obstructive pulmonary disease with (acute) exacerbation: Secondary | ICD-10-CM | POA: Diagnosis not present

## 2017-05-13 DIAGNOSIS — I10 Essential (primary) hypertension: Secondary | ICD-10-CM | POA: Diagnosis not present

## 2017-05-13 MED ORDER — METOPROLOL SUCCINATE ER 25 MG PO TB24: 25.0000 mg | ORAL_TABLET | Freq: Every day | ORAL | 0 refills | Status: DC

## 2017-05-13 MED ORDER — LEVOTHYROXINE SODIUM 112 MCG PO TABS: 112.0000 ug | ORAL_TABLET | Freq: Every day | ORAL | 0 refills | Status: DC

## 2017-05-13 NOTE — Telephone Encounter (Signed)
[facility] calling for a refill for the patients hydrea.  Next follow is May 15, 2017.  Instructed the pharmacy the patient needs to see the oncologist before a refill due to Dr. Talbert Cage not practicing here anymore and seeing Dr. Walden Field for the first time.  Verbalized understanding.

## 2017-05-13 NOTE — Telephone Encounter (Signed)
Refill sent to [facility] pharmacy 

## 2017-05-14 ENCOUNTER — Other Ambulatory Visit: Payer: Self-pay | Admitting: *Deleted

## 2017-05-14 DIAGNOSIS — J441 Chronic obstructive pulmonary disease with (acute) exacerbation: Secondary | ICD-10-CM | POA: Diagnosis not present

## 2017-05-14 DIAGNOSIS — D471 Chronic myeloproliferative disease: Secondary | ICD-10-CM | POA: Diagnosis not present

## 2017-05-14 DIAGNOSIS — I10 Essential (primary) hypertension: Secondary | ICD-10-CM | POA: Diagnosis not present

## 2017-05-14 DIAGNOSIS — J9611 Chronic respiratory failure with hypoxia: Secondary | ICD-10-CM | POA: Diagnosis not present

## 2017-05-14 DIAGNOSIS — E1136 Type 2 diabetes mellitus with diabetic cataract: Secondary | ICD-10-CM | POA: Diagnosis not present

## 2017-05-14 DIAGNOSIS — F329 Major depressive disorder, single episode, unspecified: Secondary | ICD-10-CM | POA: Diagnosis not present

## 2017-05-14 MED ORDER — METFORMIN HCL 500 MG PO TABS: ORAL_TABLET | ORAL | 0 refills | Status: DC

## 2017-05-15 ENCOUNTER — Inpatient Hospital Stay (HOSPITAL_COMMUNITY): Payer: Medicare Other | Attending: Internal Medicine | Admitting: Internal Medicine

## 2017-05-15 ENCOUNTER — Inpatient Hospital Stay (HOSPITAL_COMMUNITY): Payer: Medicare Other | Attending: Internal Medicine

## 2017-05-15 ENCOUNTER — Encounter (HOSPITAL_COMMUNITY): Payer: Self-pay | Admitting: Internal Medicine

## 2017-05-15 VITALS — BP 99/49 | HR 63 | Temp 97.6°F | Resp 20 | Wt 175.9 lb

## 2017-05-15 DIAGNOSIS — J441 Chronic obstructive pulmonary disease with (acute) exacerbation: Secondary | ICD-10-CM | POA: Diagnosis not present

## 2017-05-15 DIAGNOSIS — J9611 Chronic respiratory failure with hypoxia: Secondary | ICD-10-CM | POA: Diagnosis not present

## 2017-05-15 DIAGNOSIS — D473 Essential (hemorrhagic) thrombocythemia: Secondary | ICD-10-CM | POA: Diagnosis not present

## 2017-05-15 DIAGNOSIS — Z7982 Long term (current) use of aspirin: Secondary | ICD-10-CM

## 2017-05-15 DIAGNOSIS — I10 Essential (primary) hypertension: Secondary | ICD-10-CM

## 2017-05-15 DIAGNOSIS — F329 Major depressive disorder, single episode, unspecified: Secondary | ICD-10-CM | POA: Diagnosis not present

## 2017-05-15 DIAGNOSIS — D471 Chronic myeloproliferative disease: Secondary | ICD-10-CM | POA: Diagnosis not present

## 2017-05-15 DIAGNOSIS — D509 Iron deficiency anemia, unspecified: Secondary | ICD-10-CM | POA: Diagnosis not present

## 2017-05-15 DIAGNOSIS — Z8673 Personal history of transient ischemic attack (TIA), and cerebral infarction without residual deficits: Secondary | ICD-10-CM

## 2017-05-15 DIAGNOSIS — E1136 Type 2 diabetes mellitus with diabetic cataract: Secondary | ICD-10-CM | POA: Diagnosis not present

## 2017-05-15 LAB — CBC WITH DIFFERENTIAL/PLATELET
BASOS ABS: 0 10*3/uL (ref 0.0–0.1)
Basophils Relative: 1 %
EOS PCT: 5 %
Eosinophils Absolute: 0.2 10*3/uL (ref 0.0–0.7)
HEMATOCRIT: 37.2 % (ref 36.0–46.0)
Hemoglobin: 11.6 g/dL — ABNORMAL LOW (ref 12.0–15.0)
LYMPHS PCT: 31 %
Lymphs Abs: 1.1 10*3/uL (ref 0.7–4.0)
MCH: 35.9 pg — ABNORMAL HIGH (ref 26.0–34.0)
MCHC: 31.2 g/dL (ref 30.0–36.0)
MCV: 115.2 fL — AB (ref 78.0–100.0)
MONOS PCT: 11 %
Monocytes Absolute: 0.4 10*3/uL (ref 0.1–1.0)
NEUTROS PCT: 52 %
Neutro Abs: 2 10*3/uL (ref 1.7–7.7)
Platelets: 508 10*3/uL — ABNORMAL HIGH (ref 150–400)
RBC: 3.23 MIL/uL — AB (ref 3.87–5.11)
RDW: 14.4 % (ref 11.5–15.5)
WBC: 3.7 10*3/uL — AB (ref 4.0–10.5)

## 2017-05-15 LAB — COMPREHENSIVE METABOLIC PANEL
ALBUMIN: 3.8 g/dL (ref 3.5–5.0)
ALT: 8 U/L — AB (ref 14–54)
AST: 18 U/L (ref 15–41)
Alkaline Phosphatase: 55 U/L (ref 38–126)
Anion gap: 10 (ref 5–15)
BILIRUBIN TOTAL: 0.6 mg/dL (ref 0.3–1.2)
BUN: 14 mg/dL (ref 6–20)
CHLORIDE: 103 mmol/L (ref 101–111)
CO2: 26 mmol/L (ref 22–32)
CREATININE: 0.79 mg/dL (ref 0.44–1.00)
Calcium: 8.8 mg/dL — ABNORMAL LOW (ref 8.9–10.3)
GFR calc Af Amer: >60 mL/min (ref >60)
GLUCOSE: 107 mg/dL — AB (ref 65–99)
Potassium: 4.2 mmol/L (ref 3.5–5.1)
Sodium: 139 mmol/L (ref 135–145)
TOTAL PROTEIN: 6.3 g/dL — AB (ref 6.5–8.1)

## 2017-05-15 MED ORDER — HYDROXYUREA 500 MG PO CAPS: 500.0000 mg | ORAL_CAPSULE | Freq: Two times a day (BID) | ORAL | 1 refills | Status: DC

## 2017-05-15 NOTE — Patient Instructions (Addendum)
Jordan [name] at [facility] Discharge Instructions  You saw Dr. Walden Field today. We will check your labs in 4 weeks to see how your platelets are doing.   We will see you in 3 to 4 months. A new prescription for hydrea will be sent to your pharmacy.   Start taking the hydrea 2 tabs daily as directed by Dr. Walden Field.    Thank you for choosing [place] at [facility] to provide your oncology and hematology care.  To afford each patient quality time with our provider, please arrive at least 15 minutes before your scheduled appointment time.   If you have a lab appointment with the [place] please come in thru the  Main Entrance and check in at the main information desk  You need to re-schedule your appointment should you arrive 10 or more minutes late.  We strive to give you quality time with our providers, and arriving late affects you and other patients whose appointments are after yours.  Also, if you no show three or more times for appointments you may be dismissed from the clinic at the providers discretion.     Again, thank you for choosing [facility].  Our hope is that these requests will decrease the amount of time that you wait before being seen by our physicians.       _____________________________________________________________  Should you have questions after your visit to [facility], please contact our office at [phone] between the hours of 8:30 a.m. and 4:30 p.m.  Voicemails left after 4:30 p.m. will not be returned until the following business day.  For prescription refill requests, have your pharmacy contact our office.       Resources For Cancer Patients and their Caregivers ? American Cancer Society: Can assist with transportation, wigs, general needs, runs Look Good Feel Better.        318 635 7875 ? Cancer Care: Provides financial assistance, online support groups, medication/co-pay  assistance.  1-800-813-HOPE (684)430-4605) ? [place] Assists [place] cancer patients and their families through emotional , educational and financial support.  (581)030-5048 ? [place] DSS Where to apply for food stamps, Medicaid and utility assistance. 937 084 4712 ? [facility]: Transportation to medical appointments. (506)637-8750 ? Social Security Administration: May apply for disability if have a Stage IV cancer. 628-653-6783 7602028080 ? [employer], Disability and Transit Services: Assists with nutrition, care and transit needs. [place] Support Programs:   > Cancer Support Group  2nd Tuesday of the month 1pm-2pm, Journey Room   > Creative Journey  3rd Tuesday of the month 1130am-1pm, Journey Room

## 2017-05-19 DIAGNOSIS — J441 Chronic obstructive pulmonary disease with (acute) exacerbation: Secondary | ICD-10-CM | POA: Diagnosis not present

## 2017-05-19 DIAGNOSIS — I10 Essential (primary) hypertension: Secondary | ICD-10-CM | POA: Diagnosis not present

## 2017-05-19 DIAGNOSIS — F329 Major depressive disorder, single episode, unspecified: Secondary | ICD-10-CM | POA: Diagnosis not present

## 2017-05-19 DIAGNOSIS — J9611 Chronic respiratory failure with hypoxia: Secondary | ICD-10-CM | POA: Diagnosis not present

## 2017-05-19 DIAGNOSIS — E1136 Type 2 diabetes mellitus with diabetic cataract: Secondary | ICD-10-CM | POA: Diagnosis not present

## 2017-05-19 DIAGNOSIS — D471 Chronic myeloproliferative disease: Secondary | ICD-10-CM | POA: Diagnosis not present

## 2017-05-20 DIAGNOSIS — D471 Chronic myeloproliferative disease: Secondary | ICD-10-CM | POA: Diagnosis not present

## 2017-05-20 DIAGNOSIS — E1136 Type 2 diabetes mellitus with diabetic cataract: Secondary | ICD-10-CM | POA: Diagnosis not present

## 2017-05-20 DIAGNOSIS — F329 Major depressive disorder, single episode, unspecified: Secondary | ICD-10-CM | POA: Diagnosis not present

## 2017-05-20 DIAGNOSIS — J441 Chronic obstructive pulmonary disease with (acute) exacerbation: Secondary | ICD-10-CM | POA: Diagnosis not present

## 2017-05-20 DIAGNOSIS — I10 Essential (primary) hypertension: Secondary | ICD-10-CM | POA: Diagnosis not present

## 2017-05-20 DIAGNOSIS — J9611 Chronic respiratory failure with hypoxia: Secondary | ICD-10-CM | POA: Diagnosis not present

## 2017-05-21 DIAGNOSIS — I10 Essential (primary) hypertension: Secondary | ICD-10-CM | POA: Diagnosis not present

## 2017-05-21 DIAGNOSIS — J441 Chronic obstructive pulmonary disease with (acute) exacerbation: Secondary | ICD-10-CM | POA: Diagnosis not present

## 2017-05-21 DIAGNOSIS — E1136 Type 2 diabetes mellitus with diabetic cataract: Secondary | ICD-10-CM | POA: Diagnosis not present

## 2017-05-21 DIAGNOSIS — J9611 Chronic respiratory failure with hypoxia: Secondary | ICD-10-CM | POA: Diagnosis not present

## 2017-05-21 DIAGNOSIS — F329 Major depressive disorder, single episode, unspecified: Secondary | ICD-10-CM | POA: Diagnosis not present

## 2017-05-21 DIAGNOSIS — D471 Chronic myeloproliferative disease: Secondary | ICD-10-CM | POA: Diagnosis not present

## 2017-05-28 ENCOUNTER — Other Ambulatory Visit: Payer: Self-pay | Admitting: *Deleted

## 2017-05-28 DIAGNOSIS — J441 Chronic obstructive pulmonary disease with (acute) exacerbation: Secondary | ICD-10-CM | POA: Diagnosis not present

## 2017-05-28 DIAGNOSIS — F329 Major depressive disorder, single episode, unspecified: Secondary | ICD-10-CM | POA: Diagnosis not present

## 2017-05-28 DIAGNOSIS — I10 Essential (primary) hypertension: Secondary | ICD-10-CM | POA: Diagnosis not present

## 2017-05-28 DIAGNOSIS — J9611 Chronic respiratory failure with hypoxia: Secondary | ICD-10-CM | POA: Diagnosis not present

## 2017-05-28 DIAGNOSIS — D471 Chronic myeloproliferative disease: Secondary | ICD-10-CM | POA: Diagnosis not present

## 2017-05-28 DIAGNOSIS — E1136 Type 2 diabetes mellitus with diabetic cataract: Secondary | ICD-10-CM | POA: Diagnosis not present

## 2017-05-28 MED ORDER — METFORMIN HCL 500 MG PO TABS: ORAL_TABLET | ORAL | 0 refills | Status: DC

## 2017-06-04 DIAGNOSIS — E1136 Type 2 diabetes mellitus with diabetic cataract: Secondary | ICD-10-CM | POA: Diagnosis not present

## 2017-06-04 DIAGNOSIS — F329 Major depressive disorder, single episode, unspecified: Secondary | ICD-10-CM | POA: Diagnosis not present

## 2017-06-04 DIAGNOSIS — D471 Chronic myeloproliferative disease: Secondary | ICD-10-CM | POA: Diagnosis not present

## 2017-06-04 DIAGNOSIS — J441 Chronic obstructive pulmonary disease with (acute) exacerbation: Secondary | ICD-10-CM | POA: Diagnosis not present

## 2017-06-04 DIAGNOSIS — I10 Essential (primary) hypertension: Secondary | ICD-10-CM | POA: Diagnosis not present

## 2017-06-04 DIAGNOSIS — J9611 Chronic respiratory failure with hypoxia: Secondary | ICD-10-CM | POA: Diagnosis not present

## 2017-06-09 NOTE — Progress Notes (Signed)
Diagnosis Myeloproliferative disease ([name]) - Plan: CBC with Differential/Platelet, Comprehensive metabolic panel, Lactate dehydrogenase, CBC with Differential/Platelet, Comprehensive metabolic panel, Lactate dehydrogenase, hydroxyurea (HYDREA) 500 MG capsule  Staging Cancer Staging No matching staging information was found for the patient.  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     Assessment and Plan:  1. 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  Plt count on 05/15/2017 is 508,000.  She will remain on Hydrea and is given Rx for medication.  She will have repeat labs in 1 month.  Goal is plt count of 450.   Patient is high risk for thrombosis given that she has had a previous stroke as well as age over [age] Dr. ZhTalbert Cagereviously discussed with her that  she should be on an anticoagulant, however given her weakness there was concern regarding fall risk. She is on ASA 81 mg PO daily at this time.   2.  HTN.  BP is 99/49.  Follow-up with PCP.    3.  IDA.  HB is 11.6.  Will repeat labs in 1 month.    4.  CVA.  Pt is on ASA daily.     INTERVAL HISTORY: Jak2 V617F essential thrombocytosis.   Current Status:  Pt is seen today for follow-up to go over labs.  She needs refills on Hydrea.     Problem List Patient Active Problem List   Diagnosis Date Noted  . Respiratory distress [R06.03]   . SOB (shortness of breath) [R06.02]   . Palliative care encounter [Z51.5]   . COPD (chronic obstructive pulmonary disease) (HCRosita[J44.9] 04/14/2017  . COPD exacerbation (HCAlexandria[J44.1] 04/12/2017  . Thoracic aortic atherosclerosis (HCSamson[I70.0] 02/19/2017  . Acute on chronic respiratory failure with hypoxia (HCFriendly[J96.21] 02/14/2017  . Pneumonia [J18.9] 02/12/2017  . Acute  exacerbation of chronic obstructive pulmonary disease (COPD) (HCLe Roy[J44.1] 02/12/2017  . Dysphonia [R49.0] 06/08/2016  . 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 Iron deficiency anemia [D50.9]   . Absolute anemia [D64.9] 04/16/2016  . Myeloproliferative disease (HCStoney Point[D47.1] 01/18/2016  . Pain of upper abdomen [R10.10]   . Sinusitis [J32.9] 12/10/2015  . GERD (gastroesophageal reflux disease) [K21.9] 12/10/2015  . Hoarseness [R49.0] 12/10/2015  . Diarrhea [R19.7] 12/10/2015  . Thrombocytosis (HCWarm Springs[D47.3] 12/10/2015  . Dysphagia [R13.10] 07/25/2015  . Otitis media, acute [H66.90] 07/25/2015  . 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 Anxiety [F41.9]   . Depression [F32.9]   . Diabetes mellitus without complication (HCEnsign[E[T41.9] . Hyperlipidemia [E78.5]   . Hypertension [I10]   . Hypothyroidism [E03.9]   . Seasonal allergic rhinitis [J30.2] 08/18/2013  . Asthma with COPD (HCMorrison[J44.9] 10/06/2012    Past Medical History Past Medical History:  Diagnosis Date  . Anxiety   . Arthritis   . [facility]   Skin cancer  . Cataracts, both eyes 05/2014  . COPD (chronic obstructive pulmonary disease) (HCC)    intermittent home O2 - rarely uses  . Depression   . Diabetes mellitus without complication (HCSauk Village  . GERD (gastroesophageal reflux disease)   . Hyperkalemia   . Hyperlipidemia   . Hypertension   . Seizures (HCGambrills   From RH Negative blood when pregnant  . Stroke (HCLequire  . Thrombocytosis (HCGordon  .  Thyroid disease     Past Surgical History Past Surgical History:  Procedure Laterality Date  . ABDOMINAL HYSTERECTOMY    . ANKLE SURGERY Right   . BREAST SURGERY     Bx, none malignant  . COLONOSCOPY  2013   normal per patient  . COLONOSCOPY N/A 05/14/2016   Procedure: COLONOSCOPY;  Surgeon: Danie Binder, MD;  [facility];  Service: Endoscopy;  Laterality: N/A;  8:30AM  . ESOPHAGOGASTRODUODENOSCOPY N/A 01/14/2016   Dr. Oneida Alar:  normal esophagus, single hyperplastic gastric polyp, normal duodenum, negative H.pylori  . ROTATOR CUFF REPAIR Right   . Skin Cancers    . TONSILLECTOMY    . TONSILLECTOMY      Family History Family History  Problem Relation Age of Onset  . Alzheimer's disease Mother   . Arthritis Father   . Stroke Maternal Aunt      Social History  reports that she quit smoking about 17 years ago. Her smoking use included cigarettes. She has a 108.00 pack-year smoking history. She has never used smokeless tobacco. She reports that she does not drink alcohol or use drugs.  Medications  Current Outpatient Medications:  .  albuterol (PROVENTIL) (2.5 MG/3ML) 0.083% nebulizer solution, Take 3 mLs (2.5 mg total) by nebulization every 6 (six) hours as needed for wheezing or shortness of breath., Disp: 75 mL, Rfl: 3 .  albuterol (VENTOLIN HFA) 108 (90 Base) MCG/ACT inhaler, Inhale 2 puffs into the lungs every 4 (four) hours as needed for wheezing or shortness of breath., Disp: 18 Inhaler, Rfl: 5 .  aspirin EC 81 MG tablet, Take 81 mg by mouth daily., Disp: , Rfl:  .  fexofenadine-pseudoephedrine (ALLEGRA-D 24) 180-240 MG 24 hr tablet, Take 1 tablet by mouth 2 (two) times daily. , Disp: , Rfl:  .  gabapentin (NEURONTIN) 300 MG capsule, TAKE 1 CAPSULE BY MOUTH EVERYDAY AT BEDTIME, Disp: 30 capsule, Rfl: 11 .  guaiFENesin (MUCINEX) 600 MG 12 hr tablet, Take 1 tablet (600 mg total) by mouth 2 (two) times daily., Disp: 10 tablet, Rfl: 0 .  ipratropium (ATROVENT) 0.02 % nebulizer solution, Take 2.5 mLs (0.5 mg total) by nebulization every 6 (six) hours as needed for wheezing or shortness of breath., Disp: 75 mL, Rfl: 3 .  levothyroxine (SYNTHROID, LEVOTHROID) 112 MCG tablet, Take 1 tablet (112 mcg total) by mouth daily., Disp: 90 tablet, Rfl: 0 .  LORazepam (ATIVAN) 0.5 MG tablet, Take 1 tablet (0.5 mg total) by mouth at bedtime as needed for anxiety or sleep., Disp: 30 tablet, Rfl: 2 .  metoprolol succinate  (TOPROL-XL) 25 MG 24 hr tablet, Take 1 tablet (25 mg total) by mouth daily., Disp: 90 tablet, Rfl: 0 .  Multiple Vitamins-Minerals (ICAPS AREDS 2 PO), Take 1 capsule by mouth 2 (two) times daily., Disp: , Rfl:  .  omeprazole (PRILOSEC) 20 MG capsule, TAKE ONE CAPSULE BY MOUTH EVERY MORNING 30 MINUTES PRIOR TO YOUR FIRST MEAL, Disp: 31 capsule, Rfl: 11 .  OXYGEN, Inhale 2 L into the lungs., Disp: , Rfl:  .  PARoxetine (PAXIL) 40 MG tablet, Take 1 tablet (40 mg total) by mouth daily., Disp: 30 tablet, Rfl: 5 .  polyethylene glycol (MIRALAX / GLYCOLAX) packet, Take 17 g by mouth daily., Disp: 14 each, Rfl: 0 .  promethazine (PHENERGAN) 12.5 MG tablet, Take 1 tablet (12.5 mg total) by mouth every 8 (eight) hours as needed for nausea or vomiting., Disp: 10 tablet, Rfl: 0 .  traZODone (DESYREL) 100 MG  tablet, Take 0.5-1 tablets (50-100 mg total) by mouth at bedtime., Disp: 30 tablet, Rfl: 3 .  hydroxyurea (HYDREA) 500 MG capsule, Take 1 capsule (500 mg total) by mouth 2 (two) times daily. May take with food to minimize GI side effects., Disp: 56 capsule, Rfl: 1 .  metFORMIN (GLUCOPHAGE) 500 MG tablet, TAKE 1 TABLET (500 MG TOTAL) BY MOUTH 2 (TWO) TIMES DAILY WITH A MEAL., Disp: 180 tablet, Rfl: 0  Allergies Bee venom; Codeine; Lipitor [atorvastatin]; Penicillins; Demeclocycline; Morphine and related; and Tetracyclines & related  Review of Systems Review of Systems - Oncology ROS as per HPI otherwise 12 point ROS is negative.   Physical Exam  Vitals Wt Readings from Last 3 Encounters:  05/15/17 175 lb 14.4 oz (79.8 kg)  04/28/17 184 lb (83.5 kg)  04/12/17 172 lb 6.4 oz (78.2 kg)   Temp Readings from Last 3 Encounters:  05/15/17 97.6 F (36.4 C) (Oral)  04/28/17 (!) 96.6 F (35.9 C) (Oral)  04/17/17 97.8 F (36.6 C) (Oral)   BP Readings from Last 3 Encounters:  05/15/17 (!) 99/49  04/28/17 116/65  04/17/17 (!) 110/51   Pulse Readings from Last 3 Encounters:  05/15/17 63  04/28/17 60   04/17/17 75   Constitutional: Well-developed, well-nourished, and in no distress.   HENT: Head: Normocephalic and atraumatic.  Mouth/Throat: No oropharyngeal exudate. Mucosa moist. Eyes: Pupils are equal, round, and reactive to light. Conjunctivae are normal. No scleral icterus.  Neck: Normal range of motion. Neck supple. No JVD present.  Cardiovascular: Normal rate, regular rhythm and normal heart sounds.  Exam reveals no gallop and no friction rub.   No murmur heard. Pulmonary/Chest: Effort normal and breath sounds normal. No respiratory distress. No wheezes.No rales.  Abdominal: Soft. Bowel sounds are normal. No distension. There is no tenderness. There is no guarding.  Musculoskeletal: No edema or tenderness.  Lymphadenopathy: No cervical, axillary or supraclavicular adenopathy.  Neurological: Alert and oriented to person, place, and time. No cranial nerve deficit.  Skin: Skin is warm and dry. No rash noted. No erythema. No pallor.  Psychiatric: Affect and judgment normal.   Labs Appointment on 05/15/2017  Component Date Value Ref Range Status  . WBC 05/15/2017 3.7* 4.0 - 10.5 K/uL Final  . RBC 05/15/2017 3.23* 3.87 - 5.11 MIL/uL Final  . Hemoglobin 05/15/2017 11.6* 12.0 - 15.0 g/dL Final  . HCT 05/15/2017 37.2  36.0 - 46.0 % Final  . MCV 05/15/2017 115.2* 78.0 - 100.0 fL Final  . MCH 05/15/2017 35.9* 26.0 - 34.0 pg Final  . MCHC 05/15/2017 31.2  30.0 - 36.0 g/dL Final  . RDW 05/15/2017 14.4  11.5 - 15.5 % Final  . Platelets 05/15/2017 508* 150 - 400 K/uL Final  . Neutrophils Relative % 05/15/2017 52  % Final  . Lymphocytes Relative 05/15/2017 31  % Final  . Monocytes Relative 05/15/2017 11  % Final  . Eosinophils Relative 05/15/2017 5  % Final  . Basophils Relative 05/15/2017 1  % Final  . Neutro Abs 05/15/2017 2.0  1.7 - 7.7 K/uL Final  . Lymphs Abs 05/15/2017 1.1  0.7 - 4.0 K/uL Final  . Monocytes Absolute 05/15/2017 0.4  0.1 - 1.0 K/uL Final  . Eosinophils Absolute  05/15/2017 0.2  0.0 - 0.7 K/uL Final  . Basophils Absolute 05/15/2017 0.0  0.0 - 0.1 K/uL Final  . RBC Morphology 05/15/2017 MACROCYTES   Final   Performed at [facility], [address], [place], [place] [address]  .  Sodium 05/15/2017 139  135 - 145 mmol/L Final  . Potassium 05/15/2017 4.2  3.5 - 5.1 mmol/L Final  . Chloride 05/15/2017 103  101 - 111 mmol/L Final  . CO2 05/15/2017 26  22 - 32 mmol/L Final  . Glucose, Bld 05/15/2017 107* 65 - 99 mg/dL Final  . BUN 05/15/2017 14  6 - 20 mg/dL Final  . Creatinine, Ser 05/15/2017 0.79  0.44 - 1.00 mg/dL Final  . Calcium 05/15/2017 8.8* 8.9 - 10.3 mg/dL Final  . Total Protein 05/15/2017 6.3* 6.5 - 8.1 g/dL Final  . Albumin 05/15/2017 3.8  3.5 - 5.0 g/dL Final  . AST 05/15/2017 18  15 - 41 U/L Final  . ALT 05/15/2017 8* 14 - 54 U/L Final  . Alkaline Phosphatase 05/15/2017 55  38 - 126 U/L Final  . Total Bilirubin 05/15/2017 0.6  0.3 - 1.2 mg/dL Final  . GFR calc non Af Amer 05/15/2017 >60  >60 mL/min Final  . GFR calc Af Amer 05/15/2017 >60  >60 mL/min Final   Comment: (NOTE) The eGFR has been calculated using the CKD EPI equation. This calculation has not been validated in all clinical situations. eGFR's persistently <60 mL/min signify possible Chronic Kidney Disease.   Georgiann Hahn gap 05/15/2017 10  5 - 15 Final   Performed at [facility], [address], [place], [place] [address]     Pathology Orders Placed This Encounter  Procedures  . CBC with Differential/Platelet    Standing Status:   Future    Standing Expiration Date:   05/16/2018  . Comprehensive metabolic panel    Standing Status:   Future    Standing Expiration Date:   05/16/2018  . Lactate dehydrogenase    Standing Status:   Future    Standing Expiration Date:   05/16/2018  . CBC with Differential/Platelet    Standing Status:   Future    Standing Expiration Date:   05/16/2018  . Comprehensive metabolic panel    Standing Status:   Future    Standing Expiration Date:    05/16/2018  . Lactate dehydrogenase    Standing Status:   Future    Standing Expiration Date:   05/16/2018       Zoila Shutter MD

## 2017-06-12 ENCOUNTER — Inpatient Hospital Stay (HOSPITAL_COMMUNITY): Payer: Medicare Other | Attending: Internal Medicine

## 2017-06-12 ENCOUNTER — Other Ambulatory Visit (HOSPITAL_COMMUNITY): Payer: Medicare Other

## 2017-06-12 DIAGNOSIS — D473 Essential (hemorrhagic) thrombocythemia: Secondary | ICD-10-CM | POA: Insufficient documentation

## 2017-06-12 DIAGNOSIS — D471 Chronic myeloproliferative disease: Secondary | ICD-10-CM

## 2017-06-12 LAB — CBC WITH DIFFERENTIAL/PLATELET
BASOS ABS: 0 10*3/uL (ref 0.0–0.1)
Basophils Relative: 1 %
EOS PCT: 4 %
Eosinophils Absolute: 0.2 10*3/uL (ref 0.0–0.7)
HEMATOCRIT: 38.6 % (ref 36.0–46.0)
Hemoglobin: 12.4 g/dL (ref 12.0–15.0)
LYMPHS ABS: 0.8 10*3/uL (ref 0.7–4.0)
LYMPHS PCT: 19 %
MCH: 36.9 pg — ABNORMAL HIGH (ref 26.0–34.0)
MCHC: 32.1 g/dL (ref 30.0–36.0)
MCV: 114.9 fL — AB (ref 78.0–100.0)
MONOS PCT: 8 %
Monocytes Absolute: 0.4 10*3/uL (ref 0.1–1.0)
NEUTROS ABS: 3 10*3/uL (ref 1.7–7.7)
Neutrophils Relative %: 68 %
Platelets: 324 10*3/uL (ref 150–400)
RBC: 3.36 MIL/uL — ABNORMAL LOW (ref 3.87–5.11)
RDW: 14.9 % (ref 11.5–15.5)
WBC: 4.4 10*3/uL (ref 4.0–10.5)

## 2017-06-12 LAB — LACTATE DEHYDROGENASE: LDH: 151 U/L (ref 98–192)

## 2017-06-12 LAB — COMPREHENSIVE METABOLIC PANEL
ALBUMIN: 4.3 g/dL (ref 3.5–5.0)
ALK PHOS: 45 U/L (ref 38–126)
ALT: 10 U/L — ABNORMAL LOW (ref 14–54)
ANION GAP: 11 (ref 5–15)
AST: 16 U/L (ref 15–41)
BILIRUBIN TOTAL: 1.1 mg/dL (ref 0.3–1.2)
BUN: 13 mg/dL (ref 6–20)
CALCIUM: 9.5 mg/dL (ref 8.9–10.3)
CO2: 27 mmol/L (ref 22–32)
Chloride: 104 mmol/L (ref 101–111)
Creatinine, Ser: 0.66 mg/dL (ref 0.44–1.00)
GFR calc Af Amer: >60 mL/min (ref >60)
GFR calc non Af Amer: >60 mL/min (ref >60)
GLUCOSE: 111 mg/dL — AB (ref 65–99)
POTASSIUM: 4.6 mmol/L (ref 3.5–5.1)
Sodium: 142 mmol/L (ref 135–145)
TOTAL PROTEIN: 6.7 g/dL (ref 6.5–8.1)

## 2017-06-15 DIAGNOSIS — F329 Major depressive disorder, single episode, unspecified: Secondary | ICD-10-CM | POA: Diagnosis not present

## 2017-06-15 DIAGNOSIS — J441 Chronic obstructive pulmonary disease with (acute) exacerbation: Secondary | ICD-10-CM | POA: Diagnosis not present

## 2017-06-15 DIAGNOSIS — D471 Chronic myeloproliferative disease: Secondary | ICD-10-CM | POA: Diagnosis not present

## 2017-06-15 DIAGNOSIS — J9611 Chronic respiratory failure with hypoxia: Secondary | ICD-10-CM | POA: Diagnosis not present

## 2017-06-15 DIAGNOSIS — I10 Essential (primary) hypertension: Secondary | ICD-10-CM | POA: Diagnosis not present

## 2017-06-15 DIAGNOSIS — E1136 Type 2 diabetes mellitus with diabetic cataract: Secondary | ICD-10-CM | POA: Diagnosis not present

## 2017-06-24 DIAGNOSIS — J9611 Chronic respiratory failure with hypoxia: Secondary | ICD-10-CM | POA: Diagnosis not present

## 2017-06-24 DIAGNOSIS — J449 Chronic obstructive pulmonary disease, unspecified: Secondary | ICD-10-CM | POA: Diagnosis not present

## 2017-06-24 DIAGNOSIS — F419 Anxiety disorder, unspecified: Secondary | ICD-10-CM | POA: Diagnosis not present

## 2017-06-24 DIAGNOSIS — I10 Essential (primary) hypertension: Secondary | ICD-10-CM | POA: Diagnosis not present

## 2017-06-30 ENCOUNTER — Other Ambulatory Visit (HOSPITAL_COMMUNITY): Payer: Self-pay

## 2017-06-30 DIAGNOSIS — D471 Chronic myeloproliferative disease: Secondary | ICD-10-CM

## 2017-06-30 MED ORDER — HYDROXYUREA 500 MG PO CAPS: 500.0000 mg | ORAL_CAPSULE | Freq: Two times a day (BID) | ORAL | 1 refills | Status: DC

## 2017-06-30 NOTE — Telephone Encounter (Signed)
Received refill request from patients pharmacy for Hydrea. Reviewed with provider, chart checked and refilled.  

## 2017-08-06 ENCOUNTER — Other Ambulatory Visit: Payer: Self-pay | Admitting: Physician Assistant

## 2017-08-06 ENCOUNTER — Other Ambulatory Visit: Payer: Self-pay | Admitting: Family Medicine

## 2017-08-10 ENCOUNTER — Ambulatory Visit: Payer: Medicare Other | Admitting: Physician Assistant

## 2017-08-11 ENCOUNTER — Other Ambulatory Visit: Payer: Self-pay | Admitting: Physician Assistant

## 2017-08-18 ENCOUNTER — Other Ambulatory Visit: Payer: Self-pay | Admitting: Physician Assistant

## 2017-08-19 ENCOUNTER — Telehealth (HOSPITAL_COMMUNITY): Payer: Self-pay

## 2017-08-19 ENCOUNTER — Other Ambulatory Visit (HOSPITAL_COMMUNITY): Payer: Self-pay

## 2017-08-19 DIAGNOSIS — D471 Chronic myeloproliferative disease: Secondary | ICD-10-CM

## 2017-08-19 MED ORDER — HYDROXYUREA 500 MG PO CAPS: 500.0000 mg | ORAL_CAPSULE | Freq: Two times a day (BID) | ORAL | 1 refills | Status: DC

## 2017-08-19 NOTE — Telephone Encounter (Signed)
Refill request from [facility] for refill on Hydrea.  Chart checked with  Lab appointments kept and appointment in July 2019.   OK to refill per Dr. Walden Field.

## 2017-09-11 ENCOUNTER — Other Ambulatory Visit (HOSPITAL_COMMUNITY): Payer: Medicare Other

## 2017-09-11 ENCOUNTER — Ambulatory Visit (HOSPITAL_COMMUNITY): Payer: Medicare Other | Admitting: Internal Medicine

## 2017-09-14 ENCOUNTER — Ambulatory Visit (HOSPITAL_COMMUNITY): Payer: Medicare Other | Admitting: Internal Medicine

## 2017-09-14 ENCOUNTER — Other Ambulatory Visit (HOSPITAL_COMMUNITY): Payer: Medicare Other

## 2017-09-17 ENCOUNTER — Inpatient Hospital Stay (HOSPITAL_COMMUNITY): Payer: Medicare Other | Attending: Internal Medicine

## 2017-09-17 DIAGNOSIS — I10 Essential (primary) hypertension: Secondary | ICD-10-CM | POA: Diagnosis not present

## 2017-09-17 DIAGNOSIS — Z7982 Long term (current) use of aspirin: Secondary | ICD-10-CM | POA: Insufficient documentation

## 2017-09-17 DIAGNOSIS — Z87891 Personal history of nicotine dependence: Secondary | ICD-10-CM | POA: Diagnosis not present

## 2017-09-17 DIAGNOSIS — D509 Iron deficiency anemia, unspecified: Secondary | ICD-10-CM | POA: Insufficient documentation

## 2017-09-17 DIAGNOSIS — Z8673 Personal history of transient ischemic attack (TIA), and cerebral infarction without residual deficits: Secondary | ICD-10-CM | POA: Insufficient documentation

## 2017-09-17 DIAGNOSIS — D473 Essential (hemorrhagic) thrombocythemia: Secondary | ICD-10-CM | POA: Insufficient documentation

## 2017-09-17 DIAGNOSIS — D471 Chronic myeloproliferative disease: Secondary | ICD-10-CM

## 2017-09-17 LAB — CBC WITH DIFFERENTIAL/PLATELET
Basophils Absolute: 0.1 10*3/uL (ref 0.0–0.1)
Basophils Relative: 1 %
EOS PCT: 2 %
Eosinophils Absolute: 0.1 10*3/uL (ref 0.0–0.7)
HEMATOCRIT: 37.3 % (ref 36.0–46.0)
HEMOGLOBIN: 12.2 g/dL (ref 12.0–15.0)
LYMPHS PCT: 19 %
Lymphs Abs: 1.2 10*3/uL (ref 0.7–4.0)
MCH: 40.1 pg — ABNORMAL HIGH (ref 26.0–34.0)
MCHC: 32.7 g/dL (ref 30.0–36.0)
MCV: 122.7 fL — ABNORMAL HIGH (ref 78.0–100.0)
MONOS PCT: 14 %
Monocytes Absolute: 0.9 10*3/uL (ref 0.1–1.0)
Neutro Abs: 4 10*3/uL (ref 1.7–7.7)
Neutrophils Relative %: 64 %
Platelets: 346 10*3/uL (ref 150–400)
RBC: 3.04 MIL/uL — AB (ref 3.87–5.11)
RDW: 13.1 % (ref 11.5–15.5)
WBC: 6.3 10*3/uL (ref 4.0–10.5)

## 2017-09-17 LAB — COMPREHENSIVE METABOLIC PANEL
ALBUMIN: 3.8 g/dL (ref 3.5–5.0)
ALK PHOS: 69 U/L (ref 38–126)
ALT: 9 U/L (ref 0–44)
AST: 14 U/L — AB (ref 15–41)
Anion gap: 8 (ref 5–15)
BILIRUBIN TOTAL: 0.7 mg/dL (ref 0.3–1.2)
BUN: 11 mg/dL (ref 8–23)
CALCIUM: 8.9 mg/dL (ref 8.9–10.3)
CO2: 30 mmol/L (ref 22–32)
CREATININE: 0.82 mg/dL (ref 0.44–1.00)
Chloride: 101 mmol/L (ref 98–111)
GFR calc Af Amer: >60 mL/min (ref >60)
GFR calc non Af Amer: >60 mL/min (ref >60)
GLUCOSE: 139 mg/dL — AB (ref 70–99)
Potassium: 4 mmol/L (ref 3.5–5.1)
Sodium: 139 mmol/L (ref 135–145)
TOTAL PROTEIN: 6.7 g/dL (ref 6.5–8.1)

## 2017-09-17 LAB — LACTATE DEHYDROGENASE: LDH: 162 U/L (ref 98–192)

## 2017-09-23 DIAGNOSIS — I10 Essential (primary) hypertension: Secondary | ICD-10-CM | POA: Diagnosis not present

## 2017-09-23 DIAGNOSIS — J9611 Chronic respiratory failure with hypoxia: Secondary | ICD-10-CM | POA: Diagnosis not present

## 2017-09-23 DIAGNOSIS — J449 Chronic obstructive pulmonary disease, unspecified: Secondary | ICD-10-CM | POA: Diagnosis not present

## 2017-09-23 DIAGNOSIS — B370 Candidal stomatitis: Secondary | ICD-10-CM | POA: Diagnosis not present

## 2017-09-24 ENCOUNTER — Encounter (HOSPITAL_COMMUNITY): Payer: Self-pay | Admitting: Internal Medicine

## 2017-09-24 ENCOUNTER — Inpatient Hospital Stay (HOSPITAL_BASED_OUTPATIENT_CLINIC_OR_DEPARTMENT_OTHER): Payer: Medicare Other | Admitting: Internal Medicine

## 2017-09-24 VITALS — BP 118/68 | HR 57 | Temp 97.7°F | Resp 18 | Wt 170.2 lb

## 2017-09-24 DIAGNOSIS — D471 Chronic myeloproliferative disease: Secondary | ICD-10-CM

## 2017-09-24 DIAGNOSIS — Z87891 Personal history of nicotine dependence: Secondary | ICD-10-CM | POA: Diagnosis not present

## 2017-09-24 DIAGNOSIS — I10 Essential (primary) hypertension: Secondary | ICD-10-CM | POA: Diagnosis not present

## 2017-09-24 DIAGNOSIS — Z7982 Long term (current) use of aspirin: Secondary | ICD-10-CM | POA: Diagnosis not present

## 2017-09-24 DIAGNOSIS — Z8673 Personal history of transient ischemic attack (TIA), and cerebral infarction without residual deficits: Secondary | ICD-10-CM

## 2017-09-24 DIAGNOSIS — D473 Essential (hemorrhagic) thrombocythemia: Secondary | ICD-10-CM | POA: Diagnosis not present

## 2017-09-24 DIAGNOSIS — D509 Iron deficiency anemia, unspecified: Secondary | ICD-10-CM | POA: Diagnosis not present

## 2017-09-24 NOTE — Progress Notes (Signed)
Diagnosis Myeloproliferative disease ([place]) - Plan: CBC with Differential/Platelet, Comprehensive metabolic panel, Lactate dehydrogenase, Ferritin  Staging Cancer Staging No matching staging information was found for the patient.  Current Treatment: Hydrea  Assessment and Plan:  1. 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  Labs done 09/17/2017 reviewed with pt and shows HB 12.2 and plts 346,000.  Counts are stable with labs done 06/2017.  She will continue hydrea as directed and will RTC in 12/2017 for follow-up and repeat labs.  Goal is plt count of 450.    Patient is high risk for thrombosis given that she has had a previous stroke as well as age over 70.  Dr. Talbert Cage previously discussed with her that  she should be on an anticoagulant, however given her weakness there was concern regarding fall risk. She is on ASA 81 mg PO daily at this time.   2.  Respiratory concerns.  Pulse ox 98% on room air.  She is on Oxygen via [place].  No significant findings on PE.  Follow-up with PCP if ongoing issues.     3.  IDA.  HB is 12.2.  Pt will have repeat CBC and ferritin in 12/2017.    4.  CVA.  Pt is on ASA daily.    5.  HTN.  BP is 118/68.  Important to follow-up with PCP for blood pressure control especially due to history of CVA.     INTERVAL HISTORY: Jak2 V617F essential thrombocytosis.   Current Status:  Pt is seen today for follow-up to go over labs.    Problem List Patient Active Problem List   Diagnosis Date Noted  . Respiratory distress [R06.03]   . SOB (shortness of breath) [R06.02]   . Palliative care encounter [Z51.5]   . COPD (chronic obstructive pulmonary disease) ([place]) [J44.9] 04/14/2017  . COPD exacerbation ([place]) [J44.1] 04/12/2017  . Thoracic aortic atherosclerosis ([place]) [I70.0] 02/19/2017  . Acute on chronic respiratory failure with hypoxia ([place]) [J96.21] 02/14/2017  . Pneumonia [J18.9] 02/12/2017  . Acute exacerbation of chronic obstructive pulmonary disease (COPD) ([place]) [J44.1]  02/12/2017  . Dysphonia [R49.0] 06/08/2016  . 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 Iron deficiency anemia [D50.9]   . Absolute anemia [D64.9] 04/16/2016  . Myeloproliferative disease ([place]) [D47.1] 01/18/2016  . Pain of upper abdomen [R10.10]   . Sinusitis [J32.9] 12/10/2015  . GERD (gastroesophageal reflux disease) [K21.9] 12/10/2015  . Hoarseness [R49.0] 12/10/2015  . Diarrhea [R19.7] 12/10/2015  . Thrombocytosis ([place]) [D47.3] 12/10/2015  . Dysphagia [R13.10] 07/25/2015  . Otitis media, acute [H66.90] 07/25/2015  . 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 Anxiety [F41.9]   . Depression [F32.9]   . Diabetes mellitus without complication ([place]) [T55.7]   . Hyperlipidemia [E78.5]   . Hypertension [I10]   . Hypothyroidism [E03.9]   . Seasonal allergic rhinitis [J30.2] 08/18/2013  . Asthma with COPD ([place]) [J44.9] 10/06/2012    Past Medical History Past Medical History:  Diagnosis Date  . Anxiety   . Arthritis   . Cancer [facility])    Skin cancer  . Cataracts, both eyes 05/2014  . COPD (chronic obstructive pulmonary disease) (HCC)    intermittent home O2 - rarely uses  . Depression   . Diabetes mellitus without complication ([place])   . GERD (gastroesophageal reflux disease)   . Hyperkalemia   . Hyperlipidemia   . Hypertension   . Seizures ([place])    From RH Negative blood when pregnant  . Stroke ([place])   . Thrombocytosis ([place])   .  Thyroid disease     Past Surgical History Past Surgical History:  Procedure Laterality Date  . ABDOMINAL HYSTERECTOMY    . ANKLE SURGERY Right   . BREAST SURGERY     Bx, none malignant  . COLONOSCOPY  2013   normal per patient  . COLONOSCOPY N/A 05/14/2016   Procedure: COLONOSCOPY;  Surgeon: Danie Binder, MD;  [facility];  Service: Endoscopy;  Laterality: N/A;  8:30AM  . ESOPHAGOGASTRODUODENOSCOPY N/A 01/14/2016   Dr. Oneida Alar: normal esophagus, single hyperplastic gastric polyp, normal duodenum, negative  H.pylori  . ROTATOR CUFF REPAIR Right   . Skin Cancers    . TONSILLECTOMY    . TONSILLECTOMY      Family History Family History  Problem Relation Age of Onset  . Alzheimer's disease Mother   . Arthritis Father   . Stroke Maternal Aunt      Social History  reports that she quit smoking about 17 years ago. Her smoking use included cigarettes. She has a 108.00 pack-year smoking history. She has never used smokeless tobacco. She reports that she does not drink alcohol or use drugs.  Medications  Current Outpatient Medications:  .  aspirin EC 81 MG tablet, Take 81 mg by mouth daily., Disp: , Rfl:  .  fexofenadine-pseudoephedrine (ALLEGRA-D 24) 180-240 MG 24 hr tablet, Take 1 tablet by mouth 2 (two) times daily. , Disp: , Rfl:  .  gabapentin (NEURONTIN) 300 MG capsule, TAKE 1 CAPSULE BY MOUTH EVERYDAY AT BEDTIME, Disp: 30 capsule, Rfl: 11 .  guaiFENesin (MUCINEX) 600 MG 12 hr tablet, Take 1 tablet (600 mg total) by mouth 2 (two) times daily., Disp: 10 tablet, Rfl: 0 .  hydroxyurea (HYDREA) 500 MG capsule, Take 1 capsule (500 mg total) by mouth 2 (two) times daily. May take with food to minimize GI side effects., Disp: 56 capsule, Rfl: 1 .  ipratropium (ATROVENT) 0.02 % nebulizer solution, Take 2.5 mLs (0.5 mg total) by nebulization every 6 (six) hours as needed for wheezing or shortness of breath., Disp: 75 mL, Rfl: 3 .  levothyroxine (SYNTHROID, LEVOTHROID) 112 MCG tablet, TAKE 1 TABLET DAILY, Disp: 90 tablet, Rfl: 1 .  LORazepam (ATIVAN) 0.5 MG tablet, Take 1 tablet (0.5 mg total) by mouth at bedtime as needed for anxiety or sleep., Disp: 30 tablet, Rfl: 2 .  metFORMIN (GLUCOPHAGE) 500 MG tablet, TAKE 1 TABLET BY MOUTH 2 TIMES DAILY WITH A MEAL., Disp: 180 tablet, Rfl: 0 .  metoprolol succinate (TOPROL-XL) 25 MG 24 hr tablet, TAKE 1 TABLET DAILY, Disp: 90 tablet, Rfl: 0 .  Multiple Vitamins-Minerals (ICAPS AREDS 2 PO), Take 1 capsule by mouth 2 (two) times daily., Disp: , Rfl:  .   omeprazole (PRILOSEC) 20 MG capsule, TAKE ONE CAPSULE BY MOUTH EVERY MORNING 30 MINUTES PRIOR TO YOUR FIRST MEAL, Disp: 31 capsule, Rfl: 11 .  OXYGEN, Inhale 2 L into the lungs., Disp: , Rfl:  .  PARoxetine (PAXIL) 40 MG tablet, Take 1 tablet (40 mg total) by mouth daily., Disp: 30 tablet, Rfl: 5 .  polyethylene glycol (MIRALAX / GLYCOLAX) packet, Take 17 g by mouth daily., Disp: 14 each, Rfl: 0 .  promethazine (PHENERGAN) 12.5 MG tablet, Take 1 tablet (12.5 mg total) by mouth every 8 (eight) hours as needed for nausea or vomiting., Disp: 10 tablet, Rfl: 0 .  traZODone (DESYREL) 100 MG tablet, Take 0.5-1 tablets (50-100 mg total) by mouth at bedtime., Disp: 30 tablet, Rfl: 3 .  traZODone (DESYREL) 100  MG tablet, TAKE 1/2 TO 1 TABLET AT BEDTIME (TAKES 1 TABLET), Disp: 30 tablet, Rfl: 5  Allergies Bee venom; Codeine; Lipitor [atorvastatin]; Penicillins; Demeclocycline; Morphine and related; and Tetracyclines & related  Review of Systems Review of Systems - Oncology ROS negative other than respiratory symptoms   Physical Exam  Vitals Wt Readings from Last 3 Encounters:  09/24/17 170 lb 3.2 oz (77.2 kg)  05/15/17 175 lb 14.4 oz (79.8 kg)  04/28/17 184 lb (83.5 kg)   Temp Readings from Last 3 Encounters:  09/24/17 97.7 F (36.5 C) (Oral)  05/15/17 97.6 F (36.4 C) (Oral)  04/28/17 (!) 96.6 F (35.9 C) (Oral)   BP Readings from Last 3 Encounters:  09/24/17 118/68  05/15/17 (!) 99/49  04/28/17 116/65   Pulse Readings from Last 3 Encounters:  09/24/17 (!) 57  05/15/17 63  04/28/17 60    Constitutional: Well-developed, well-nourished, and in no distress.   HENT: Head: Normocephalic and atraumatic.  Mouth/Throat: No oropharyngeal exudate. Mucosa moist. Eyes: Pupils are equal, round, and reactive to light. Conjunctivae are normal. No scleral icterus.  Neck: Normal range of motion. Neck supple. No JVD present.  Cardiovascular: Normal rate, regular rhythm and normal heart sounds.   Exam reveals no gallop and no friction rub.   No murmur heard. Pulmonary/Chest: Coarse BS.   Abdominal: Soft. Bowel sounds are normal. No distension. There is no tenderness. There is no guarding.  Musculoskeletal: No edema or tenderness.  Lymphadenopathy: No cervical, axillary or supraclavicular adenopathy.  Neurological: Alert and oriented to person, place, and time. No cranial nerve deficit.  Skin: Skin is warm and dry. No rash noted. No erythema. No pallor.  Psychiatric: Affect and judgment normal.   Labs No visits with results within 3 Day(s) from this visit.  Latest known visit with results is:  Appointment on 09/17/2017  Component Date Value Ref Range Status  . WBC 09/17/2017 6.3  4.0 - 10.5 K/uL Final  . RBC 09/17/2017 3.04* 3.87 - 5.11 MIL/uL Final  . Hemoglobin 09/17/2017 12.2  12.0 - 15.0 g/dL Final  . HCT 09/17/2017 37.3  36.0 - 46.0 % Final  . MCV 09/17/2017 122.7* 78.0 - 100.0 fL Final  . MCH 09/17/2017 40.1* 26.0 - 34.0 pg Final  . MCHC 09/17/2017 32.7  30.0 - 36.0 g/dL Final  . RDW 09/17/2017 13.1  11.5 - 15.5 % Final  . Platelets 09/17/2017 346  150 - 400 K/uL Final  . Neutrophils Relative % 09/17/2017 64  % Final  . Lymphocytes Relative 09/17/2017 19  % Final  . Monocytes Relative 09/17/2017 14  % Final  . Eosinophils Relative 09/17/2017 2  % Final  . Basophils Relative 09/17/2017 1  % Final  . Neutro Abs 09/17/2017 4.0  1.7 - 7.7 K/uL Final  . Lymphs Abs 09/17/2017 1.2  0.7 - 4.0 K/uL Final  . Monocytes Absolute 09/17/2017 0.9  0.1 - 1.0 K/uL Final  . Eosinophils Absolute 09/17/2017 0.1  0.0 - 0.7 K/uL Final  . Basophils Absolute 09/17/2017 0.1  0.0 - 0.1 K/uL Final  . RBC Morphology 09/17/2017 ANISOCYTES   Final   Comment: MACROCYTES Performed at [facility], [address] [place], [place] [address]   . Sodium 09/17/2017 139  135 - 145 mmol/L Final  . Potassium 09/17/2017 4.0  3.5 - 5.1 mmol/L Final  . Chloride 09/17/2017 101  98 - 111 mmol/L Final    Please note change in reference range.  . CO2 09/17/2017 30  22 -  32 mmol/L Final  . Glucose, Bld 09/17/2017 139* 70 - 99 mg/dL Final   Please note change in reference range.  . BUN 09/17/2017 11  8 - 23 mg/dL Final   Please note change in reference range.  . Creatinine, Ser 09/17/2017 0.82  0.44 - 1.00 mg/dL Final  . Calcium 09/17/2017 8.9  8.9 - 10.3 mg/dL Final  . Total Protein 09/17/2017 6.7  6.5 - 8.1 g/dL Final  . Albumin 09/17/2017 3.8  3.5 - 5.0 g/dL Final  . AST 09/17/2017 14* 15 - 41 U/L Final  . ALT 09/17/2017 9  0 - 44 U/L Final   Please note change in reference range.  . Alkaline Phosphatase 09/17/2017 69  38 - 126 U/L Final  . Total Bilirubin 09/17/2017 0.7  0.3 - 1.2 mg/dL Final  . GFR calc non Af Amer 09/17/2017 >60  >60 mL/min Final  . GFR calc Af Amer 09/17/2017 >60  >60 mL/min Final   Comment: (NOTE) The eGFR has been calculated using the CKD EPI equation. This calculation has not been validated in all clinical situations. eGFR's persistently <60 mL/min signify possible Chronic Kidney Disease.   Georgiann Hahn gap 09/17/2017 8  5 - 15 Final   Performed at [facility], [address] [place], [place] [address]  . LDH 09/17/2017 162  98 - 192 U/L Final   Performed at [facility], [address] [place], [place] [address]     Pathology Orders Placed This Encounter  Procedures  . CBC with Differential/Platelet    Standing Status:   Future    Standing Expiration Date:   09/25/2018  . Comprehensive metabolic panel    Standing Status:   Future    Standing Expiration Date:   09/25/2018  . Lactate dehydrogenase    Standing Status:   Future    Standing Expiration Date:   09/25/2018  . Ferritin    Standing Status:   Future    Standing Expiration Date:   09/25/2018       Zoila Shutter MD

## 2017-09-24 NOTE — Patient Instructions (Signed)
[facility] at [facility] Discharge Instructions  You saw Dr. Higgs today.   Thank you for choosing [facility] at [facility] to provide your oncology and hematology care.  To afford each patient quality time with our provider, please arrive at least 15 minutes before your scheduled appointment time.   If you have a lab appointment with the Cancer Center please come in thru the  Main Entrance and check in at the main information desk  You need to re-schedule your appointment should you arrive 10 or more minutes late.  We strive to give you quality time with our providers, and arriving late affects you and other patients whose appointments are after yours.  Also, if you no show three or more times for appointments you may be dismissed from the clinic at the providers discretion.     Again, thank you for choosing [facility].  Our hope is that these requests will decrease the amount of time that you wait before being seen by our physicians.       _____________________________________________________________  Should you have questions after your visit to [facility], please contact our office at [phone] between the hours of 8:30 a.m. and 4:30 p.m.  Voicemails left after 4:30 p.m. will not be returned until the following business day.  For prescription refill requests, have your pharmacy contact our office.       Resources For Cancer Patients and their Caregivers ? American Cancer Society: Can assist with transportation, wigs, general needs, runs Look Good Feel Better.        1-888-227-6333 ? Cancer Care: Provides financial assistance, online support groups, medication/co-pay assistance.  1-800-813-HOPE (4673) ? [facility] Assists [place] cancer patients and their families through emotional , educational and financial support.  336-427-4357 ? [place] DSS Where to apply for food  stamps, Medicaid and utility assistance. 336-342-1394 ? [facility]: Transportation to medical appointments. 336-347-2287 ? Social Security Administration: May apply for disability if have a Stage IV cancer. 336-342-7796 1-800-772-1213 ? [place] Aging, Disability and Transit Services: Assists with nutrition, care and transit needs. 336-349-2343  Cancer Center Support Programs:   > Cancer Support Group  2nd Tuesday of the month 1pm-2pm, Journey Room   > Creative Journey  3rd Tuesday of the month 1130am-1pm, Journey Room     

## 2017-10-16 ENCOUNTER — Other Ambulatory Visit (HOSPITAL_COMMUNITY): Payer: Self-pay

## 2017-10-16 DIAGNOSIS — D471 Chronic myeloproliferative disease: Secondary | ICD-10-CM

## 2017-10-16 MED ORDER — HYDROXYUREA 500 MG PO CAPS: 500.0000 mg | ORAL_CAPSULE | Freq: Two times a day (BID) | ORAL | 3 refills | Status: DC

## 2017-10-16 NOTE — Telephone Encounter (Signed)
Received refill request from patients pharmacy for Hydrea. Reviewed with provider, chart checked and refilled.  

## 2017-10-29 ENCOUNTER — Other Ambulatory Visit: Payer: Self-pay | Admitting: Physician Assistant

## 2017-11-03 ENCOUNTER — Ambulatory Visit: Payer: Medicare Other | Admitting: Physician Assistant

## 2017-11-13 ENCOUNTER — Other Ambulatory Visit: Payer: Self-pay | Admitting: Physician Assistant

## 2017-11-13 NOTE — Telephone Encounter (Signed)
Last seen 04/28/17

## 2017-11-16 ENCOUNTER — Other Ambulatory Visit: Payer: Self-pay | Admitting: Physician Assistant

## 2017-11-17 ENCOUNTER — Other Ambulatory Visit: Payer: Self-pay | Admitting: Physician Assistant

## 2017-11-17 NOTE — Telephone Encounter (Signed)
NA-cb 9/10

## 2017-11-17 NOTE — Telephone Encounter (Signed)
Jones. NTBS 30d given 10/31/17.

## 2017-11-26 ENCOUNTER — Other Ambulatory Visit: Payer: Self-pay | Admitting: Physician Assistant

## 2017-11-27 NOTE — Telephone Encounter (Signed)
Jones. NTBS 30d given 10/31/17. Last OV 04/28/17

## 2017-11-27 NOTE — Telephone Encounter (Signed)
Attempted to contact patient - NVM 

## 2017-12-04 ENCOUNTER — Other Ambulatory Visit: Payer: Self-pay | Admitting: Physician Assistant

## 2017-12-04 NOTE — Telephone Encounter (Signed)
Last seen 04/28/17  Glenard Haring PCP

## 2017-12-04 NOTE — Telephone Encounter (Signed)
Attempted to contact patient - NA °

## 2017-12-04 NOTE — Telephone Encounter (Signed)
MUST have OV for additional fills. This is last refill until seen by PCP.

## 2017-12-09 ENCOUNTER — Other Ambulatory Visit: Payer: Self-pay | Admitting: Physician Assistant

## 2017-12-10 NOTE — Telephone Encounter (Signed)
Jones. NTBS 30 days given 11/13/17

## 2017-12-10 NOTE — Telephone Encounter (Signed)
Attempted to contact patient - NVM 

## 2017-12-15 ENCOUNTER — Other Ambulatory Visit: Payer: Self-pay | Admitting: Physician Assistant

## 2017-12-16 NOTE — Telephone Encounter (Signed)
Last seen 04/28/17

## 2017-12-17 ENCOUNTER — Ambulatory Visit (INDEPENDENT_AMBULATORY_CARE_PROVIDER_SITE_OTHER): Payer: Medicare Other | Admitting: Physician Assistant

## 2017-12-17 VITALS — BP 105/61 | HR 58 | Temp 97.6°F | Ht 66.0 in | Wt 168.0 lb

## 2017-12-17 DIAGNOSIS — D471 Chronic myeloproliferative disease: Secondary | ICD-10-CM

## 2017-12-17 DIAGNOSIS — Z23 Encounter for immunization: Secondary | ICD-10-CM | POA: Diagnosis not present

## 2017-12-17 DIAGNOSIS — J449 Chronic obstructive pulmonary disease, unspecified: Secondary | ICD-10-CM

## 2017-12-17 MED ORDER — LORAZEPAM 1 MG PO TABS: 1.0000 mg | ORAL_TABLET | Freq: Two times a day (BID) | ORAL | 2 refills | Status: DC | PRN

## 2017-12-17 MED ORDER — OMEPRAZOLE 20 MG PO CPDR: DELAYED_RELEASE_CAPSULE | ORAL | 11 refills | Status: DC

## 2017-12-17 MED ORDER — LEVOTHYROXINE SODIUM 112 MCG PO TABS: 112.0000 ug | ORAL_TABLET | Freq: Every day | ORAL | 3 refills | Status: DC

## 2017-12-17 MED ORDER — FLUTICASONE-UMECLIDIN-VILANT 100-62.5-25 MCG/INH IN AEPB: 1.0000 | INHALATION_SPRAY | Freq: Every day | RESPIRATORY_TRACT | 12 refills | Status: DC

## 2017-12-17 MED ORDER — GUAIFENESIN ER 600 MG PO TB12: 600.0000 mg | ORAL_TABLET | Freq: Two times a day (BID) | ORAL | 11 refills | Status: AC

## 2017-12-17 MED ORDER — GABAPENTIN 300 MG PO CAPS: ORAL_CAPSULE | ORAL | 11 refills | Status: DC

## 2017-12-17 MED ORDER — METOPROLOL SUCCINATE ER 25 MG PO TB24: 25.0000 mg | ORAL_TABLET | Freq: Every day | ORAL | 11 refills | Status: DC

## 2017-12-17 MED ORDER — PAROXETINE HCL 40 MG PO TABS: 40.0000 mg | ORAL_TABLET | Freq: Every day | ORAL | 11 refills | Status: DC

## 2017-12-17 MED ORDER — HYDROXYUREA 500 MG PO CAPS: 500.0000 mg | ORAL_CAPSULE | Freq: Two times a day (BID) | ORAL | 11 refills | Status: DC

## 2017-12-17 MED ORDER — METFORMIN HCL 500 MG PO TABS: 500.0000 mg | ORAL_TABLET | Freq: Two times a day (BID) | ORAL | 11 refills | Status: DC

## 2017-12-17 MED ORDER — TRAZODONE HCL 100 MG PO TABS: ORAL_TABLET | ORAL | 11 refills | Status: DC

## 2017-12-21 ENCOUNTER — Encounter: Payer: Self-pay | Admitting: Physician Assistant

## 2017-12-21 NOTE — Progress Notes (Signed)
BP 105/61   Pulse (!) 58   Temp 97.6 F (36.4 C) (Oral)   Ht 5\' 6"  (1.676 m)   Wt 168 lb (76.2 kg)   SpO2 96%   BMI 27.12 kg/m    Subjective:    Patient ID: Jordan [name], female    DOB: 03-12-1940, 77 y.o.   MRN: [id number]  HPI: Jordan [name] is a 77 y.o. female presenting on 12/17/2017 for No chief complaint on file.  This patient comes in for periodic recheck on medications and conditions including COPD and myeloproliferative disease.  She is currently under hospice care and staying at home.  She is not having any difficulties at this time.  All of her medications are reviewed and we will send refills on all of her medications.  She is updated on her vaccines today 2.  Reports that overall she has been doing very well in recent months.  All medications are reviewed today. There are no reports of any problems with the medications. All of the medical conditions are reviewed and updated.  Lab work is reviewed and will be ordered as medically necessary. There are no new problems reported with today's visit.    Past Medical History:  Diagnosis Date  . Anxiety   . Arthritis   . Cancer [facility])    Skin cancer  . Cataracts, both eyes 05/2014  . COPD (chronic obstructive pulmonary disease) (HCC)    intermittent home O2 - rarely uses  . Depression   . Diabetes mellitus without complication ([place])   . GERD (gastroesophageal reflux disease)   . Hyperkalemia   . Hyperlipidemia   . Hypertension   . Seizures ([place])    From RH Negative blood when pregnant  . Stroke ([place])   . Thrombocytosis ([place])   . Thyroid disease    Relevant past medical, surgical, family and social history reviewed and updated as indicated. Interim medical history since our last visit reviewed. Allergies and medications reviewed and updated. DATA REVIEWED: CHART IN EPIC  Family History reviewed for pertinent findings.  Review of Systems  Constitutional: Positive for fatigue. Negative for chills and fever.  HENT:  Negative.   Eyes: Negative.   Respiratory: Positive for cough and shortness of breath. Negative for wheezing.   Cardiovascular: Negative.   Gastrointestinal: Negative.   Genitourinary: Negative.   Musculoskeletal: Positive for arthralgias.    Allergies as of 12/17/2017      Reactions   Bee Venom Anaphylaxis   Codeine Itching   Lipitor [atorvastatin]    Extreme Dry Mouth, joint pain   Penicillins Hives   Has patient had a PCN reaction causing immediate rash, facial/tongue/throat swelling, SOB or lightheadedness with hypotension: No Has patient had a PCN reaction causing severe rash involving mucus membranes or skin necrosis: Yes Has patient had a PCN reaction that required hospitalization: No Has patient had a PCN reaction occurring within the last 10 years: No If all of the above answers are "NO", then may proceed with Cephalosporin use.   Demeclocycline Rash   Morphine And Related Rash   Tetracyclines & Related Rash      Medication List        Accurate as of 12/17/17 11:59 PM. Always use your most recent med list.          aspirin EC 81 MG tablet Take 81 mg by mouth daily.   fexofenadine-pseudoephedrine 180-240 MG 24 hr tablet Commonly known as:  ALLEGRA-D 24 Take 1 tablet by mouth 2 (two) times  daily.   Fluticasone-Umeclidin-Vilant 100-62.5-25 MCG/INH Aepb Inhale 1 puff into the lungs daily.   gabapentin 300 MG capsule Commonly known as:  NEURONTIN TAKE 1 CAPSULE BY MOUTH EVERYDAY AT BEDTIME   guaiFENesin 600 MG 12 hr tablet Commonly known as:  MUCINEX Take 1 tablet (600 mg total) by mouth 2 (two) times daily.   hydroxyurea 500 MG capsule Commonly known as:  HYDREA Take 1 capsule (500 mg total) by mouth 2 (two) times daily. May take with food to minimize GI side effects.   ICAPS AREDS 2 PO Take 1 capsule by mouth 2 (two) times daily.   levothyroxine 112 MCG tablet Commonly known as:  SYNTHROID, LEVOTHROID Take 1 tablet (112 mcg total) by mouth daily.     LORazepam 1 MG tablet Commonly known as:  ATIVAN Take 1 tablet (1 mg total) by mouth 2 (two) times daily as needed for anxiety.   metFORMIN 500 MG tablet Commonly known as:  GLUCOPHAGE Take 1 tablet (500 mg total) by mouth 2 (two) times daily with a meal.   metoprolol succinate 25 MG 24 hr tablet Commonly known as:  TOPROL-XL Take 1 tablet (25 mg total) by mouth daily. Needs OV for further fills   omeprazole 20 MG capsule Commonly known as:  PRILOSEC TAKE ONE CAPSULE BY MOUTH EVERY MORNING 30 MINUTES PRIOR TO YOUR FIRST MEAL   OXYGEN Inhale 2 L into the lungs.   PARoxetine 40 MG tablet Commonly known as:  PAXIL Take 1 tablet (40 mg total) by mouth daily.   polyethylene glycol packet Commonly known as:  MIRALAX / GLYCOLAX Take 17 g by mouth daily.   traZODone 100 MG tablet Commonly known as:  DESYREL TAKE 1/2 TO 1 TABLET AT BEDTIME (TAKES 1 TABLET)          Objective:    BP 105/61   Pulse (!) 58   Temp 97.6 F (36.4 C) (Oral)   Ht 5\' 6"  (1.676 m)   Wt 168 lb (76.2 kg)   SpO2 96%   BMI 27.12 kg/m   Allergies  Allergen Reactions  . Bee Venom Anaphylaxis  . Codeine Itching  . Lipitor [Atorvastatin]     Extreme Dry Mouth, joint pain  . Penicillins Hives    Has patient had a PCN reaction causing immediate rash, facial/tongue/throat swelling, SOB or lightheadedness with hypotension: No Has patient had a PCN reaction causing severe rash involving mucus membranes or skin necrosis: Yes Has patient had a PCN reaction that required hospitalization: No Has patient had a PCN reaction occurring within the last 10 years: No If all of the above answers are "NO", then may proceed with Cephalosporin use.   . Demeclocycline Rash  . Morphine And Related Rash  . Tetracyclines & Related Rash    Wt Readings from Last 3 Encounters:  12/17/17 168 lb (76.2 kg)  09/24/17 170 lb 3.2 oz (77.2 kg)  05/15/17 175 lb 14.4 oz (79.8 kg)    Physical Exam  Constitutional: She is  oriented to person, place, and time. She appears well-developed and well-nourished.  HENT:  Head: Normocephalic and atraumatic.  Right Ear: Tympanic membrane, external ear and ear canal normal.  Left Ear: Tympanic membrane, external ear and ear canal normal.  Nose: Nose normal. No rhinorrhea.  Mouth/Throat: Oropharynx is clear and moist and mucous membranes are normal. No oropharyngeal exudate or posterior oropharyngeal erythema.  Eyes: Pupils are equal, round, and reactive to light. Conjunctivae and EOM are normal.  Neck: Normal range  of motion. Neck supple.  Cardiovascular: Normal rate, regular rhythm, normal heart sounds and intact distal pulses.  Pulmonary/Chest: Effort normal and breath sounds normal.  Abdominal: Soft. Bowel sounds are normal.  Neurological: She is alert and oriented to person, place, and time. She has normal reflexes.  Skin: Skin is warm and dry. No rash noted.  Psychiatric: She has a normal mood and affect. Her behavior is normal. Judgment and thought content normal.    Results for orders placed or performed in visit on 09/17/17  CBC with Differential/Platelet  Result Value Ref Range   WBC 6.3 4.0 - 10.5 K/uL   RBC 3.04 (L) 3.87 - 5.11 MIL/uL   Hemoglobin 12.2 12.0 - 15.0 g/dL   HCT 37.3 36.0 - 46.0 %   MCV 122.7 (H) 78.0 - 100.0 fL   MCH 40.1 (H) 26.0 - 34.0 pg   MCHC 32.7 30.0 - 36.0 g/dL   RDW 13.1 11.5 - 15.5 %   Platelets 346 150 - 400 K/uL   Neutrophils Relative % 64 %   Lymphocytes Relative 19 %   Monocytes Relative 14 %   Eosinophils Relative 2 %   Basophils Relative 1 %   Neutro Abs 4.0 1.7 - 7.7 K/uL   Lymphs Abs 1.2 0.7 - 4.0 K/uL   Monocytes Absolute 0.9 0.1 - 1.0 K/uL   Eosinophils Absolute 0.1 0.0 - 0.7 K/uL   Basophils Absolute 0.1 0.0 - 0.1 K/uL   RBC Morphology ANISOCYTES   Comprehensive metabolic panel  Result Value Ref Range   Sodium 139 135 - 145 mmol/L   Potassium 4.0 3.5 - 5.1 mmol/L   Chloride 101 98 - 111 mmol/L   CO2 30 22  - 32 mmol/L   Glucose, Bld 139 (H) 70 - 99 mg/dL   BUN 11 8 - 23 mg/dL   Creatinine, Ser 0.82 0.44 - 1.00 mg/dL   Calcium 8.9 8.9 - 10.3 mg/dL   Total Protein 6.7 6.5 - 8.1 g/dL   Albumin 3.8 3.5 - 5.0 g/dL   AST 14 (L) 15 - 41 U/L   ALT 9 0 - 44 U/L   Alkaline Phosphatase 69 38 - 126 U/L   Total Bilirubin 0.7 0.3 - 1.2 mg/dL   GFR calc non Af Amer >60 >60 mL/min   GFR calc Af Amer >60 >60 mL/min   Anion gap 8 5 - 15  Lactate dehydrogenase  Result Value Ref Range   LDH 162 98 - 192 U/L      Assessment & Plan:   1. 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 May take with food to minimize GI side effects.  Dispense: 60 capsule; Refill: 11  2. Encounter for immunization - Flu vaccine HIGH DOSE PF  3. Chronic obstructive pulmonary disease, unspecified COPD type ([place]) - Fluticasone-Umeclidin-Vilant (TRELEGY ELLIPTA) 100-62.5-25 MCG/INH AEPB; Inhale 1 puff into the lungs daily.  Dispense: 28 each; Refill: 12   Continue all other maintenance medications as listed above.  Follow up plan: Recheck 1 year  Educational handout given for [place] PA-C [facility] [address]  [place], [place] [address] [phone]   12/21/2017, 3:06 PM

## 2017-12-24 ENCOUNTER — Other Ambulatory Visit (HOSPITAL_COMMUNITY): Payer: Medicare Other

## 2017-12-31 ENCOUNTER — Ambulatory Visit (HOSPITAL_COMMUNITY): Payer: Medicare Other | Admitting: Internal Medicine

## 2018-02-12 ENCOUNTER — Inpatient Hospital Stay (HOSPITAL_COMMUNITY): Payer: Medicare Other | Attending: Hematology

## 2018-02-12 DIAGNOSIS — D509 Iron deficiency anemia, unspecified: Secondary | ICD-10-CM | POA: Insufficient documentation

## 2018-02-12 DIAGNOSIS — D471 Chronic myeloproliferative disease: Secondary | ICD-10-CM

## 2018-02-12 LAB — CBC WITH DIFFERENTIAL/PLATELET
ABS IMMATURE GRANULOCYTES: 0.02 10*3/uL (ref 0.00–0.07)
BASOS ABS: 0.1 10*3/uL (ref 0.0–0.1)
BASOS PCT: 1 %
Eosinophils Absolute: 0.2 10*3/uL (ref 0.0–0.5)
Eosinophils Relative: 3 %
HCT: 38.2 % (ref 36.0–46.0)
Hemoglobin: 12.1 g/dL (ref 12.0–15.0)
IMMATURE GRANULOCYTES: 0 %
LYMPHS ABS: 1 10*3/uL (ref 0.7–4.0)
Lymphocytes Relative: 19 %
MCH: 38.4 pg — AB (ref 26.0–34.0)
MCHC: 31.7 g/dL (ref 30.0–36.0)
MCV: 121.3 fL — AB (ref 80.0–100.0)
MONO ABS: 0.6 10*3/uL (ref 0.1–1.0)
Monocytes Relative: 12 %
NRBC: 0 % (ref 0.0–0.2)
Neutro Abs: 3.5 10*3/uL (ref 1.7–7.7)
Neutrophils Relative %: 65 %
PLATELETS: 338 10*3/uL (ref 150–400)
RBC: 3.15 MIL/uL — AB (ref 3.87–5.11)
RDW: 12.4 % (ref 11.5–15.5)
WBC: 5.4 10*3/uL (ref 4.0–10.5)

## 2018-02-12 LAB — COMPREHENSIVE METABOLIC PANEL
ALT: 10 U/L (ref 0–44)
AST: 17 U/L (ref 15–41)
Albumin: 4.2 g/dL (ref 3.5–5.0)
Alkaline Phosphatase: 57 U/L (ref 38–126)
Anion gap: 9 (ref 5–15)
BUN: 16 mg/dL (ref 8–23)
CHLORIDE: 104 mmol/L (ref 98–111)
CO2: 26 mmol/L (ref 22–32)
Calcium: 9 mg/dL (ref 8.9–10.3)
Creatinine, Ser: 0.7 mg/dL (ref 0.44–1.00)
Glucose, Bld: 107 mg/dL — ABNORMAL HIGH (ref 70–99)
POTASSIUM: 4 mmol/L (ref 3.5–5.1)
SODIUM: 139 mmol/L (ref 135–145)
Total Bilirubin: 0.8 mg/dL (ref 0.3–1.2)
Total Protein: 6.9 g/dL (ref 6.5–8.1)

## 2018-02-12 LAB — LACTATE DEHYDROGENASE: LDH: 173 U/L (ref 98–192)

## 2018-02-12 LAB — FERRITIN: FERRITIN: 13 ng/mL (ref 11–307)

## 2018-02-16 ENCOUNTER — Telehealth (HOSPITAL_COMMUNITY): Payer: Self-pay | Admitting: Surgery

## 2018-02-16 NOTE — Telephone Encounter (Signed)
Spoke to pt's husband and gave him the results of his wife's blood work.  Per Dr. Walden Field, the pt's Hemoglobin and Platelets were normal, and also the pt's iron level was 13 which is within the normal limits.  Pt's husband verbalized understanding and was told to call our office if they had any questions.

## 2018-03-15 ENCOUNTER — Inpatient Hospital Stay (HOSPITAL_COMMUNITY): Payer: Medicare Other | Attending: Hematology | Admitting: Internal Medicine

## 2018-03-15 ENCOUNTER — Encounter (HOSPITAL_COMMUNITY): Payer: Self-pay | Admitting: Internal Medicine

## 2018-03-15 ENCOUNTER — Other Ambulatory Visit: Payer: Self-pay

## 2018-03-15 VITALS — BP 115/43 | HR 63 | Temp 97.8°F | Resp 16 | Wt 164.3 lb

## 2018-03-15 DIAGNOSIS — Z8673 Personal history of transient ischemic attack (TIA), and cerebral infarction without residual deficits: Secondary | ICD-10-CM

## 2018-03-15 DIAGNOSIS — D473 Essential (hemorrhagic) thrombocythemia: Secondary | ICD-10-CM | POA: Diagnosis not present

## 2018-03-15 DIAGNOSIS — Z7982 Long term (current) use of aspirin: Secondary | ICD-10-CM | POA: Diagnosis not present

## 2018-03-15 DIAGNOSIS — I10 Essential (primary) hypertension: Secondary | ICD-10-CM

## 2018-03-15 DIAGNOSIS — D509 Iron deficiency anemia, unspecified: Secondary | ICD-10-CM

## 2018-03-15 DIAGNOSIS — Z87891 Personal history of nicotine dependence: Secondary | ICD-10-CM | POA: Diagnosis not present

## 2018-03-15 DIAGNOSIS — D471 Chronic myeloproliferative disease: Secondary | ICD-10-CM

## 2018-03-15 NOTE — Progress Notes (Signed)
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  Assessment and Plan:   1. 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  Labs done 02/12/2018 reviewed and showed WBC 5.4 HB 12.1 plts 338,000.  Chemistries WNL with K+ 4, Cr 0.70 and normal LFTs.  Pt should continue hydrea 500 mg bid as directed.  Counts remain stable compared to labs done 09/17/2017 that showed  HB 12.2 and plts 346,000. Goal is plt count of <= 450, 000.  She will have repeat labs in 09/2018 and will follow-up at that time to go over results.    Patient is high risk for thrombosis given that she has had a previous stroke as well as age over 6.  Dr. Talbert Cage previously discussed with her that  she should be on an anticoagulant, however given her weakness there was concern regarding fall risk. She is on ASA 81 mg PO daily at this time.   2.  LE leg concerns.  Pt has chronic venous stasis changes.  Pulse ox 100% on room air.  She is on Oxygen via [place].  Prior doppler evaluation in 06/2014 showed no DVT.  Follow-up with PCP if ongoing issues.     3.  IDA.  HB is 12.1.  Ferritin 13.  Based on MP history will monitor HB for changes.    4.  CVA.  Pt is on ASA daily.    5.  HTN.  BP is 115/43.  Follow-up with PCP for blood pressure control especially due to history of CVA.     INTERVAL HISTORY: Jak2 V617F essential thrombocytosis.   Current Status:  Pt is seen today for follow-up to go over labs.  She continues to tolerate hydrea.    Problem List Patient Active Problem List   Diagnosis Date Noted  . Respiratory distress [R06.03]   . SOB (shortness of breath) [R06.02]   . Palliative care encounter [Z51.5]   . COPD (chronic obstructive pulmonary disease) ([place]) [J44.9] 04/14/2017  . COPD exacerbation ([place]) [J44.1] 04/12/2017  . Thoracic aortic atherosclerosis ([place]) [I70.0]  02/19/2017  . Acute on chronic respiratory failure with hypoxia ([place]) [J96.21] 02/14/2017  . Pneumonia [J18.9] 02/12/2017  . Acute exacerbation of chronic obstructive pulmonary disease (COPD) ([place]) [J44.1] 02/12/2017  . Dysphonia [R49.0] 06/08/2016  . 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 Iron deficiency anemia [D50.9]   . Absolute anemia [D64.9] 04/16/2016  . 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 Pain of upper abdomen [R10.10]   . Sinusitis [J32.9] 12/10/2015  . GERD (gastroesophageal reflux disease) [K21.9] 12/10/2015  . Hoarseness [R49.0] 12/10/2015  . Diarrhea [R19.7] 12/10/2015  . Thrombocytosis ([place]) [D47.3] 12/10/2015  . Dysphagia [R13.10] 07/25/2015  . Otitis media, acute [H66.90] 07/25/2015  . 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 Anxiety [F41.9]   . Depression [F32.9]   . Diabetes mellitus without complication ([place]) [Z02.5]   . Hyperlipidemia [E78.5]   . Hypertension [I10]   . Hypothyroidism [E03.9]   . Seasonal allergic rhinitis [J30.2] 08/18/2013  . Asthma with COPD ([place]) [J44.9] 10/06/2012    Past Medical History Past Medical History:  Diagnosis Date  . Anxiety   . Arthritis   . Cancer [facility])    Skin cancer  . Cataracts, both eyes 05/2014  . COPD (chronic obstructive pulmonary disease) (HCC)    intermittent home O2 - rarely uses  . Depression   . Diabetes mellitus without  complication ([place])   . GERD (gastroesophageal reflux disease)   . Hyperkalemia   . Hyperlipidemia   . Hypertension   . Seizures ([place])    From RH Negative blood when pregnant  . Stroke ([place])   . Thrombocytosis ([place])   . Thyroid disease     Past Surgical History Past Surgical History:  Procedure Laterality Date  . ABDOMINAL HYSTERECTOMY    . ANKLE SURGERY Right   . BREAST SURGERY     Bx, none malignant  . COLONOSCOPY  2013   normal per patient  . COLONOSCOPY N/A 05/14/2016   Procedure: COLONOSCOPY;  Surgeon: Danie Binder, MD;   Location: [facility] END[facility];  Service: Endoscopy;  Laterality: N/A;  8:30AM  . ESOPHAGOGASTRODUODENOSCOPY N/A 01/14/2016   Dr. Oneida Alar: normal esophagus, single hyperplastic gastric polyp, normal duodenum, negative H.pylori  . ROTATOR CUFF REPAIR Right   . Skin Cancers    . TONSILLECTOMY    . TONSILLECTOMY      Family History Family History  Problem Relation Age of Onset  . Alzheimer's disease Mother   . Arthritis Father   . Stroke Maternal Aunt      Social History  reports that she quit smoking about 18 years ago. Her smoking use included cigarettes. She has a 108.00 pack-year smoking history. She has never used smokeless tobacco. She reports that she does not drink alcohol or use drugs.  Medications  Current Outpatient Medications:  .  aspirin EC 81 MG tablet, Take 81 mg by mouth daily., Disp: , Rfl:  .  fexofenadine-pseudoephedrine (ALLEGRA-D 24) 180-240 MG 24 hr tablet, Take 1 tablet by mouth as needed (allergies). , Disp: , Rfl:  .  Fluticasone-Umeclidin-Vilant (TRELEGY ELLIPTA) 100-62.5-25 MCG/INH AEPB, Inhale 1 puff into the lungs daily., Disp: 28 each, Rfl: 12 .  gabapentin (NEURONTIN) 300 MG capsule, TAKE 1 CAPSULE BY MOUTH EVERYDAY AT BEDTIME, Disp: 30 capsule, Rfl: 11 .  guaiFENesin (MUCINEX) 600 MG 12 hr tablet, Take 1 tablet (600 mg total) by mouth 2 (two) times daily., Disp: 60 tablet, Rfl: 11 .  hydroxyurea (HYDREA) 500 MG capsule, Take 1 capsule (500 mg total) by mouth 2 (two) times daily. May take with food to minimize GI side effects., Disp: 60 capsule, Rfl: 11 .  levothyroxine (SYNTHROID, LEVOTHROID) 112 MCG tablet, Take 1 tablet (112 mcg total) by mouth daily., Disp: 90 tablet, Rfl: 3 .  LORazepam (ATIVAN) 1 MG tablet, Take 1 tablet (1 mg total) by mouth 2 (two) times daily as needed for anxiety., Disp: 30 tablet, Rfl: 2 .  metFORMIN (GLUCOPHAGE) 500 MG tablet, Take 1 tablet (500 mg total) by mouth 2 (two) times daily with a meal., Disp: 60 tablet, Rfl: 11 .  metoprolol  succinate (TOPROL-XL) 25 MG 24 hr tablet, Take 1 tablet (25 mg total) by mouth daily. Needs OV for further fills, Disp: 30 tablet, Rfl: 11 .  Multiple Vitamins-Minerals (ICAPS AREDS 2 PO), Take 1 capsule by mouth 2 (two) times daily., Disp: , Rfl:  .  omeprazole (PRILOSEC) 20 MG capsule, TAKE ONE CAPSULE BY MOUTH EVERY MORNING 30 MINUTES PRIOR TO YOUR FIRST MEAL, Disp: 31 capsule, Rfl: 11 .  OXYGEN, Inhale 2 L into the lungs., Disp: , Rfl:  .  PARoxetine (PAXIL) 40 MG tablet, Take 1 tablet (40 mg total) by mouth daily., Disp: 30 tablet, Rfl: 11 .  traZODone (DESYREL) 100 MG tablet, TAKE 1/2 TO 1 TABLET AT BEDTIME (TAKES 1 TABLET), Disp: 30 tablet, Rfl: 11  Allergies  Bee venom; Codeine; Lipitor [atorvastatin]; Penicillins; Demeclocycline; Morphine and related; and Tetracyclines & related  Review of Systems Review of Systems - Oncology ROS negative other than leg concerns.     Physical Exam  Vitals Wt Readings from Last 3 Encounters:  03/15/18 164 lb 4.8 oz (74.5 kg)  12/17/17 168 lb (76.2 kg)  09/24/17 170 lb 3.2 oz (77.2 kg)   Temp Readings from Last 3 Encounters:  03/15/18 97.8 F (36.6 C) (Oral)  12/17/17 97.6 F (36.4 C) (Oral)  09/24/17 97.7 F (36.5 C) (Oral)   BP Readings from Last 3 Encounters:  03/15/18 (!) 115/43  12/17/17 105/61  09/24/17 118/68   Pulse Readings from Last 3 Encounters:  03/15/18 63  12/17/17 (!) 58  09/24/17 (!) 57   Constitutional: Well-developed, well-nourished, and in no distress.   HENT: Head: Normocephalic and atraumatic.  Mouth/Throat: No oropharyngeal exudate. Mucosa moist. Eyes: Pupils are equal, round, and reactive to light. Conjunctivae are normal. No scleral icterus.  Neck: Normal range of motion. Neck supple. No JVD present.  Cardiovascular: Normal rate, regular rhythm and normal heart sounds.  Exam reveals no gallop and no friction rub.   No murmur heard. Pulmonary/Chest: Effort normal and breath sounds normal. No respiratory  distress. No wheezes.No rales.  Abdominal: Soft. Bowel sounds are normal. No distension. There is no tenderness. There is no guarding.  Musculoskeletal: Chronic venous stasis changes bilaterally.   Lymphadenopathy: No cervical, axillary or supraclavicular adenopathy.  Neurological: Alert and oriented to person, place, and time. No cranial nerve deficit.  Skin: Skin is warm and dry. No rash noted. No erythema. No pallor.  Psychiatric: Affect and judgment normal.   Labs No visits with results within 3 Day(s) from this visit.  Latest known visit with results is:  Appointment on 02/12/2018  Component Date Value Ref Range Status  . WBC 02/12/2018 5.4  4.0 - 10.5 K/uL Final  . RBC 02/12/2018 3.15* 3.87 - 5.11 MIL/uL Final  . Hemoglobin 02/12/2018 12.1  12.0 - 15.0 g/dL Final  . HCT 02/12/2018 38.2  36.0 - 46.0 % Final  . MCV 02/12/2018 121.3* 80.0 - 100.0 fL Final  . MCH 02/12/2018 38.4* 26.0 - 34.0 pg Final  . MCHC 02/12/2018 31.7  30.0 - 36.0 g/dL Final  . RDW 02/12/2018 12.4  11.5 - 15.5 % Final  . Platelets 02/12/2018 338  150 - 400 K/uL Final  . nRBC 02/12/2018 0.0  0.0 - 0.2 % Final  . Neutrophils Relative % 02/12/2018 65  % Final  . Neutro Abs 02/12/2018 3.5  1.7 - 7.7 K/uL Final  . Lymphocytes Relative 02/12/2018 19  % Final  . Lymphs Abs 02/12/2018 1.0  0.7 - 4.0 K/uL Final  . Monocytes Relative 02/12/2018 12  % Final  . Monocytes Absolute 02/12/2018 0.6  0.1 - 1.0 K/uL Final  . Eosinophils Relative 02/12/2018 3  % Final  . Eosinophils Absolute 02/12/2018 0.2  0.0 - 0.5 K/uL Final  . Basophils Relative 02/12/2018 1  % Final  . Basophils Absolute 02/12/2018 0.1  0.0 - 0.1 K/uL Final  . RBC Morphology 02/12/2018 MACROCYTES   Final  . Immature Granulocytes 02/12/2018 0  % Final  . Abs Immature Granulocytes 02/12/2018 0.02  0.00 - 0.07 K/uL Final   Performed at [facility], [address], [place], [place] [address]  . Sodium 02/12/2018 139  135 - 145 mmol/L Final  . Potassium  02/12/2018 4.0  3.5 - 5.1 mmol/L Final  . Chloride 02/12/2018  104  98 - 111 mmol/L Final  . CO2 02/12/2018 26  22 - 32 mmol/L Final  . Glucose, Bld 02/12/2018 107* 70 - 99 mg/dL Final  . BUN 02/12/2018 16  8 - 23 mg/dL Final  . Creatinine, Ser 02/12/2018 0.70  0.44 - 1.00 mg/dL Final  . Calcium 02/12/2018 9.0  8.9 - 10.3 mg/dL Final  . Total Protein 02/12/2018 6.9  6.5 - 8.1 g/dL Final  . Albumin 02/12/2018 4.2  3.5 - 5.0 g/dL Final  . AST 02/12/2018 17  15 - 41 U/L Final  . ALT 02/12/2018 10  0 - 44 U/L Final  . Alkaline Phosphatase 02/12/2018 57  38 - 126 U/L Final  . Total Bilirubin 02/12/2018 0.8  0.3 - 1.2 mg/dL Final  . GFR calc non Af Amer 02/12/2018 >60  >60 mL/min Final  . GFR calc Af Amer 02/12/2018 >60  >60 mL/min Final  . Anion gap 02/12/2018 9  5 - 15 Final   Performed at [facility], [address] [place], [place] [address]  . LDH 02/12/2018 173  98 - 192 U/L Final   Performed at [facility], [address] [place], [place] [address]  . Ferritin 02/12/2018 13  11 - 307 ng/mL Final   Performed at [facility], [address], [place], [place] [address]     Pathology Orders Placed This Encounter  Procedures  . CBC with Differential/Platelet    Standing Status:   Future    Standing Expiration Date:   03/16/2019  . Comprehensive metabolic panel    Standing Status:   Future    Standing Expiration Date:   03/16/2019  . Lactate dehydrogenase    Standing Status:   Future    Standing Expiration Date:   03/16/2019       Zoila Shutter MD

## 2018-03-15 NOTE — Patient Instructions (Signed)
[facility] at [facility]  Discharge Instructions: You saw Dr. Higgs today                               _______________________________________________________________  Thank you for choosing [facility] at [facility] to provide your oncology and hematology care.  To afford each patient quality time with our providers, please arrive at least 15 minutes before your scheduled appointment.  You need to re-schedule your appointment if you arrive 10 or more minutes late.  We strive to give you quality time with our providers, and arriving late affects you and other patients whose appointments are after yours.  Also, if you no show three or more times for appointments you may be dismissed from the clinic.  Again, thank you for choosing [facility] at [facility]. Our hope is that these requests will allow you access to exceptional care and in a timely manner. _______________________________________________________________  If you have questions after your visit, please contact our office at [phone] between the hours of 8:30 a.m. and 5:00 p.m. Voicemails left after 4:30 p.m. will not be returned until the following business day. _______________________________________________________________  For prescription refill requests, have your pharmacy contact our office. _______________________________________________________________  Recommendations made by the consultant and any test results will be sent to your referring physician. _______________________________________________________________ 

## 2018-03-25 ENCOUNTER — Telehealth: Payer: Self-pay | Admitting: Physician Assistant

## 2018-03-25 NOTE — Telephone Encounter (Signed)
Orders faxed

## 2018-04-22 DIAGNOSIS — E119 Type 2 diabetes mellitus without complications: Secondary | ICD-10-CM | POA: Diagnosis not present

## 2018-04-22 DIAGNOSIS — I10 Essential (primary) hypertension: Secondary | ICD-10-CM | POA: Diagnosis not present

## 2018-04-22 DIAGNOSIS — J9611 Chronic respiratory failure with hypoxia: Secondary | ICD-10-CM | POA: Diagnosis not present

## 2018-04-22 DIAGNOSIS — J449 Chronic obstructive pulmonary disease, unspecified: Secondary | ICD-10-CM | POA: Diagnosis not present

## 2018-04-29 ENCOUNTER — Ambulatory Visit: Payer: Medicare Other | Admitting: Nurse Practitioner

## 2018-05-11 ENCOUNTER — Telehealth: Payer: Self-pay | Admitting: Physician Assistant

## 2018-05-12 ENCOUNTER — Ambulatory Visit: Payer: Medicare Other

## 2018-05-14 NOTE — Telephone Encounter (Signed)
Aware Rx faxed to Wilfred Curtis at [facility] on 05/12/18

## 2018-06-23 ENCOUNTER — Ambulatory Visit: Payer: Medicare Other | Admitting: *Deleted

## 2018-06-23 ENCOUNTER — Encounter: Payer: Medicare Other | Admitting: *Deleted

## 2018-07-20 ENCOUNTER — Ambulatory Visit: Payer: Medicare Other | Admitting: Physician Assistant

## 2018-09-08 ENCOUNTER — Ambulatory Visit (INDEPENDENT_AMBULATORY_CARE_PROVIDER_SITE_OTHER): Payer: Medicare Other | Admitting: *Deleted

## 2018-09-08 ENCOUNTER — Encounter: Payer: Self-pay | Admitting: *Deleted

## 2018-09-08 ENCOUNTER — Other Ambulatory Visit: Payer: Self-pay

## 2018-09-08 DIAGNOSIS — Z0000 Encounter for general adult medical examination without abnormal findings: Secondary | ICD-10-CM | POA: Diagnosis not present

## 2018-09-08 NOTE — Patient Instructions (Signed)
Preventive Care 79 Years and Older, Female Preventive care refers to lifestyle choices and visits with your health care provider that can promote health and wellness. This includes:  A yearly physical exam. This is also called an annual well check.  Regular dental and eye exams.  Immunizations.  Screening for certain conditions.  Healthy lifestyle choices, such as diet and exercise. What can I expect for my preventive care visit? Physical exam Your health care provider will check:  Height and weight. These may be used to calculate body mass index (BMI), which is a measurement that tells if you are at a healthy weight.  Heart rate and blood pressure.  Your skin for abnormal spots. Counseling Your health care provider may ask you questions about:  Alcohol, tobacco, and drug use.  Emotional well-being.  Home and relationship well-being.  Sexual activity.  Eating habits.  History of falls.  Memory and ability to understand (cognition).  Work and work Statistician.  Pregnancy and menstrual history. What immunizations do I need?  Influenza (flu) vaccine  This is recommended every year. Tetanus, diphtheria, and pertussis (Tdap) vaccine  You may need a Td booster every 10 years. Varicella (chickenpox) vaccine  You may need this vaccine if you have not already been vaccinated. Zoster (shingles) vaccine  You may need this after age 33. Pneumococcal conjugate (PCV13) vaccine  One dose is recommended after age 33. Pneumococcal polysaccharide (PPSV23) vaccine  One dose is recommended after age 72. Measles, mumps, and rubella (MMR) vaccine  You may need at least one dose of MMR if you were born in 1957 or later. You may also need a second dose. Meningococcal conjugate (MenACWY) vaccine  You may need this if you have certain conditions. Hepatitis A vaccine  You may need this if you have certain conditions or if you travel or work in places where you may be exposed  to hepatitis A. Hepatitis B vaccine  You may need this if you have certain conditions or if you travel or work in places where you may be exposed to hepatitis B. Haemophilus influenzae type b (Hib) vaccine  You may need this if you have certain conditions. You may receive vaccines as individual doses or as more than one vaccine together in one shot (combination vaccines). Talk with your health care provider about the risks and benefits of combination vaccines. What tests do I need? Blood tests  Lipid and cholesterol levels. These may be checked every 5 years, or more frequently depending on your overall health.  Hepatitis C test.  Hepatitis B test. Screening  Lung cancer screening. You may have this screening every year starting at age 39 if you have a 30-pack-year history of smoking and currently smoke or have quit within the past 15 years.  Colorectal cancer screening. All adults should have this screening starting at age 36 and continuing until age 15. Your health care provider may recommend screening at age 23 if you are at increased risk. You will have tests every 1-10 years, depending on your results and the type of screening test.  Diabetes screening. This is done by checking your blood sugar (glucose) after you have not eaten for a while (fasting). You may have this done every 1-3 years.  Mammogram. This may be done every 1-2 years. Talk with your health care provider about how often you should have regular mammograms.  BRCA-related cancer screening. This may be done if you have a family history of breast, ovarian, tubal, or peritoneal cancers.  Other tests  Sexually transmitted disease (STD) testing.  Bone density scan. This is done to screen for osteoporosis. You may have this done starting at age 76. Follow these instructions at home: Eating and drinking  Eat a diet that includes fresh fruits and vegetables, whole grains, lean protein, and low-fat dairy products. Limit  your intake of foods with high amounts of sugar, saturated fats, and salt.  Take vitamin and mineral supplements as recommended by your health care provider.  Do not drink alcohol if your health care provider tells you not to drink.  If you drink alcohol: ? Limit how much you have to 0-1 drink a day. ? Be aware of how much alcohol is in your drink. In the [place] one drink equals one 12 oz bottle of beer (355 mL), one 5 oz glass of wine (148 mL), or one 1 oz glass of hard liquor (44 mL). Lifestyle  Take daily care of your teeth and gums.  Stay active. Exercise for at least 30 minutes on 5 or more days each week.  Do not use any products that contain nicotine or tobacco, such as cigarettes, e-cigarettes, and chewing tobacco. If you need help quitting, ask your health care provider.  If you are sexually active, practice safe sex. Use a condom or other form of protection in order to prevent STIs (sexually transmitted infections).  Talk with your health care provider about taking a low-dose aspirin or statin. What's next?  Go to your health care provider once a year for a well check visit.  Ask your health care provider how often you should have your eyes and teeth checked.  Stay up to date on all vaccines. This information is not intended to replace advice given to you by your health care provider. Make sure you discuss any questions you have with your health care provider. Document Released: 03/23/2015 Document Revised: 02/18/2018 Document Reviewed: 02/18/2018 Elsevier Patient Education  2020 [employer].

## 2018-09-08 NOTE — Progress Notes (Signed)
MEDICARE ANNUAL WELLNESS VISIT  09/08/2018  Telephone Visit Disclaimer This Medicare AWV was conducted by telephone due to national recommendations for restrictions regarding the COVID-19 Pandemic (e.g. social distancing).  I verified, using two identifiers, that I am speaking with Jordan [name] or their authorized healthcare agent. I discussed the limitations, risks, security, and privacy concerns of performing an evaluation and management service by telephone and the potential availability of an in-person appointment in the future. The patient expressed understanding and agreed to proceed.   Subjective:  Jordan [name] is a 78 y.o. female patient of Terald Sleeper, PA-C who had a Medicare Annual Wellness Visit today via telephone. Jordan [name] is Retired and lives with their spouse. she has 1 child. she reports that she is socially active and does interact with friends/family regularly. she is not physically active and enjoys doing crossword puzzles, playing dominoes and reading.  Patient Care Team: Theodoro Clock as PCP - General (Physician Assistant) Danie Binder, MD as Consulting Physician (Gastroenterology)  Advanced Directives 09/08/2018 03/15/2018 09/24/2017 05/15/2017 04/12/2017 04/12/2017 02/12/2017  Does Patient Have a Medical Advance Directive? Yes Yes Yes Yes Yes Yes Yes  Type of Advance Directive Living will [place];Out of facility DNR (pink MOST or yellow form) [place];Living will [place];Living will Healthcare Power of [place];Living will Living will  Does patient want to make changes to medical advance directive? No - Patient declined No - Patient declined No - Patient declined No - Patient declined No - Patient declined - No - Patient declined  Copy of [place] in Chart? - No - copy requested No - copy requested No - copy requested No - copy requested - -  Would patient like  information on creating a medical advance directive? - - - - - [facility] Utilization Over the Past 12 Months: # of hospitalizations or ER visits: 0 # of surgeries: 0  Review of Systems    Patient reports that her overall health is unchanged compared to last year.  Patient Reported Readings (BP, Pulse, CBG, Weight, etc) none  Review of Systems: No complaints  All other systems negative.  Pain Assessment Pain : No/denies pain     Current Medications & Allergies (verified) Allergies as of 09/08/2018      Reactions   Bee Venom Anaphylaxis   Codeine Itching   Lipitor [atorvastatin]    Extreme Dry Mouth, joint pain   Penicillins Hives   Has patient had a PCN reaction causing immediate rash, facial/tongue/throat swelling, SOB or lightheadedness with hypotension: No Has patient had a PCN reaction causing severe rash involving mucus membranes or skin necrosis: Yes Has patient had a PCN reaction that required hospitalization: No Has patient had a PCN reaction occurring within the last 10 years: No If all of the above answers are "NO", then may proceed with Cephalosporin use.   Demeclocycline Rash   Morphine And Related Rash   Tetracyclines & Related Rash      Medication List       Accurate as of September 08, 2018  4:01 PM. If you have any questions, ask your nurse or doctor.        aspirin EC 81 MG tablet Take 81 mg by mouth daily.   fexofenadine-pseudoephedrine 180-240 MG 24 hr tablet Commonly known as: ALLEGRA-D 24 Take 1 tablet by mouth as needed (allergies).   Fluticasone-Umeclidin-Vilant 100-62.5-25 MCG/INH  Aepb Commonly known as: Trelegy Ellipta Inhale 1 puff into the lungs daily.   gabapentin 300 MG capsule Commonly known as: NEURONTIN TAKE 1 CAPSULE BY MOUTH EVERYDAY AT BEDTIME   guaiFENesin 600 MG 12 hr tablet Commonly known as: MUCINEX Take 1 tablet (600 mg total) by mouth 2 (two) times daily.   hydroxyurea 500 MG capsule Commonly known as: HYDREA  Take 1 capsule (500 mg total) by mouth 2 (two) times daily. May take with food to minimize GI side effects.   ICAPS AREDS 2 PO Take 1 capsule by mouth 2 (two) times daily.   levothyroxine 112 MCG tablet Commonly known as: SYNTHROID Take 1 tablet (112 mcg total) by mouth daily.   LORazepam 1 MG tablet Commonly known as: ATIVAN Take 1 tablet (1 mg total) by mouth 2 (two) times daily as needed for anxiety.   metFORMIN 500 MG tablet Commonly known as: GLUCOPHAGE Take 1 tablet (500 mg total) by mouth 2 (two) times daily with a meal.   metoprolol succinate 25 MG 24 hr tablet Commonly known as: TOPROL-XL Take 1 tablet (25 mg total) by mouth daily. Needs OV for further fills   omeprazole 20 MG capsule Commonly known as: PRILOSEC TAKE ONE CAPSULE BY MOUTH EVERY MORNING 30 MINUTES PRIOR TO YOUR FIRST MEAL   OXYGEN Inhale 2 L into the lungs.   PARoxetine 40 MG tablet Commonly known as: PAXIL Take 1 tablet (40 mg total) by mouth daily.   traZODone 100 MG tablet Commonly known as: DESYREL TAKE 1/2 TO 1 TABLET AT BEDTIME (TAKES 1 TABLET)       History (reviewed): Past Medical History:  Diagnosis Date  . Anxiety   . Arthritis   . Cancer [facility])    Skin cancer  . Cataracts, both eyes 05/2014  . COPD (chronic obstructive pulmonary disease) (HCC)    intermittent home O2 - rarely uses  . Depression   . Diabetes mellitus without complication ([place])   . GERD (gastroesophageal reflux disease)   . Hyperkalemia   . Hyperlipidemia   . Hypertension   . Seizures ([place])    From RH Negative blood when pregnant  . Stroke ([place])   . Thrombocytosis ([place])   . Thyroid disease    Past Surgical History:  Procedure Laterality Date  . ABDOMINAL HYSTERECTOMY    . ANKLE SURGERY Right   . BREAST SURGERY     Bx, none malignant  . COLONOSCOPY  2013   normal per patient  . COLONOSCOPY N/A 05/14/2016   Procedure: COLONOSCOPY;  Surgeon: Danie Binder, MD;  Location: [facility];  Service:  Endoscopy;  Laterality: N/A;  8:30AM  . ESOPHAGOGASTRODUODENOSCOPY N/A 01/14/2016   Dr. Oneida Alar: normal esophagus, single hyperplastic gastric polyp, normal duodenum, negative H.pylori  . ROTATOR CUFF REPAIR Right   . Skin Cancers    . TONSILLECTOMY    . TONSILLECTOMY     Family History  Problem Relation Age of Onset  . Alzheimer's disease Mother   . Arthritis Father   . Stroke Maternal Aunt    Social History   Socioeconomic History  . Marital status: Married    Spouse name: Carleene Overlie  . Number of children: 1  . Years of education: 62  . Highest education level: Associate degree: academic program  Occupational History  . Occupation: retired  Scientific laboratory technician  . Financial resource strain: Not hard at all  . Food insecurity    Worry: Never true    Inability: Never true  .  Transportation needs    Medical: No    Non-medical: No  Tobacco Use  . Smoking status: Former Smoker    Packs/day: 3.00    Years: 36.00    Pack years: 108.00    Types: Cigarettes    Quit date: 03/10/2000    Years since quitting: 18.5  . Smokeless tobacco: Never Used  Substance and Sexual Activity  . Alcohol use: No  . Drug use: No  . Sexual activity: Not Currently  Lifestyle  . Physical activity    Days per week: 0 days    Minutes per session: 0 min  . Stress: Not at all  Relationships  . Social connections    Talks on phone: More than three times a week    Gets together: More than three times a week    Attends religious service: Never    Active member of club or organization: No    Attends meetings of clubs or organizations: Never    Relationship status: Married  Other Topics Concern  . Not on file  Social History Narrative  . Not on file    Activities of Daily Living In your present state of health, do you have any difficulty performing the following activities: 09/08/2018  Hearing? N  Vision? N  Difficulty concentrating or making decisions? Y  Comment husband takes care of everything for her   Walking or climbing stairs? Y  Comment due to her COPD/SOB  Dressing or bathing? Y  Comment she gets SOB at times and her husband helps  Doing errands, shopping? Y  Comment husband runs all the errands and takes her to appts  Preparing Food and eating ? Y  Comment husband cooks all of her meals  Using the Toilet? N  In the past six months, have you accidently leaked urine? Y  Comment wears Poise adult briefs  Do you have problems with loss of bowel control? N  Managing your Medications? N  Managing your Finances? Y  Comment husband manages her finances  Housekeeping or managing your Housekeeping? Y  Comment husband manages her finances  Some recent data might be hidden    Patient Literacy How often do you need to have someone help you when you read instructions, pamphlets, or other written materials from your doctor or pharmacy?: 1 - Never What is the last grade level you completed in school?: 2 years of college  Exercise Current Exercise Habits: The patient does not participate in regular exercise at present, Exercise limited by: respiratory conditions(s)  Diet Patient reports consuming 2 meals a day and 1 snack(s) a day Patient reports that her primary diet is: Regular Patient reports that she does have regular access to food.   Depression Screen PHQ 2/9 Scores 09/08/2018 12/17/2017 04/10/2017 02/19/2017 02/12/2017 09/11/2016 11/22/2015  PHQ - 2 Score 0 0 0 5 0 0 0  PHQ- 9 Score - - - 22 - - -     Fall Risk Fall Risk  09/08/2018 12/17/2017 04/10/2017 02/12/2017 09/11/2016  Falls in the past year? 0 No No No No  Risk for fall due to : - - - - -     Objective:  Jordan [name] seemed alert and oriented and she participated appropriately during our telephone visit.  Blood Pressure Weight BMI  BP Readings from Last 3 Encounters:  03/15/18 (!) 115/43  12/17/17 105/61  09/24/17 118/68   Wt Readings from Last 3 Encounters:  03/15/18 164 lb 4.8 oz (74.5 kg)  12/17/17 168 lb (  76.2 kg)   09/24/17 170 lb 3.2 oz (77.2 kg)   BMI Readings from Last 1 Encounters:  03/15/18 26.52 kg/m    *Unable to obtain current vital signs, weight, and BMI due to telephone visit type  Hearing/Vision  . [place] did not seem to have difficulty with hearing/understanding during the telephone conversation . Reports that she has not had a formal eye exam by an eye care professional within the past year . Reports that she has not had a formal hearing evaluation within the past year *Unable to fully assess hearing and vision during telephone visit type  Cognitive Function: 6CIT Screen 09/08/2018  What Year? 0 points  What month? 3 points  What time? 0 points  Count back from 20 0 points  Months in reverse 0 points  Repeat phrase 0 points  Total Score 3   (Normal:0-7, Significant for Dysfunction: >8)  Normal Cognitive Function Screening: Yes   Immunization & Health Maintenance Record Immunization History  Administered Date(s) Administered  . Influenza Split 12/09/2011  . Influenza, High Dose Seasonal PF 02/07/2015, 01/09/2017, 12/17/2017  . Influenza,inj,Quad PF,6+ Mos 11/19/2012, 01/19/2014  . Pneumococcal Polysaccharide-23 10/06/2012  . Td 08/14/2015    Health Maintenance  Topic Date Due  . DEXA SCAN  05/09/2004  . PNA vac Low Risk Adult (2 of 2 - PCV13) 10/06/2013  . OPHTHALMOLOGY EXAM  12/28/2014  . FOOT EXAM  04/16/2016  . URINE MICROALBUMIN  04/16/2016  . HEMOGLOBIN A1C  08/13/2017  . INFLUENZA VACCINE  10/09/2018  . TETANUS/TDAP  08/13/2025       Assessment  This is a routine wellness examination for [facility].  Health Maintenance: Due or Overdue Health Maintenance Due  Topic Date Due  . DEXA SCAN  05/09/2004  . PNA vac Low Risk Adult (2 of 2 - PCV13) 10/06/2013  . OPHTHALMOLOGY EXAM  12/28/2014  . FOOT EXAM  04/16/2016  . URINE MICROALBUMIN  04/16/2016  . HEMOGLOBIN A1C  08/13/2017    Jordan [name] does not need a referral for Community Assistance: Care  Management:   no Social Work:    no Prescription Assistance:  no Nutrition/Diabetes Education:  no   Plan:  Personalized Goals Goals Addressed            This Visit's Progress   . DIET - INCREASE WATER INTAKE       Try to drink 6-8 glasses of water daily      Personalized Health Maintenance & Screening Recommendations  Pneumococcal vaccine  Bone densitometry screening Shingles vaccine  Lung Cancer Screening Recommended: no (Low Dose CT Chest recommended if Age 28-80 years, 30 pack-year currently smoking OR have quit w/in past 15 years) Hepatitis C Screening recommended: no HIV Screening recommended: no  Advanced Directives: Written information was not prepared per patient's request.  Referrals & Orders No orders of the defined types were placed in this encounter.   Follow-up Plan . Follow-up with Terald Sleeper, PA-C as planned . Consider Prevnar 13, Shingles vaccines at your next visit with your PCP   I have personally reviewed and noted the following in the patient's chart:   . Medical and social history . Use of alcohol, tobacco or illicit drugs  . Current medications and supplements . Functional ability and status . Nutritional status . Physical activity . Advanced directives . List of other physicians . Hospitalizations, surgeries, and ER visits in previous 12 months . Vitals . Screenings to include cognitive, depression, and falls . Referrals and  appointments  In addition, I have reviewed and discussed with Jordan [name] certain preventive protocols, quality metrics, and best practice recommendations. A written personalized care plan for preventive services as well as general preventive health recommendations is available and can be mailed to the patient at her request.      Marylin Crosby, LPN  07/11/6501

## 2018-09-15 ENCOUNTER — Inpatient Hospital Stay (HOSPITAL_COMMUNITY): Payer: Medicare Other | Attending: Hematology

## 2018-09-15 ENCOUNTER — Other Ambulatory Visit: Payer: Self-pay

## 2018-09-15 DIAGNOSIS — R05 Cough: Secondary | ICD-10-CM | POA: Insufficient documentation

## 2018-09-15 DIAGNOSIS — Z7982 Long term (current) use of aspirin: Secondary | ICD-10-CM | POA: Diagnosis not present

## 2018-09-15 DIAGNOSIS — D471 Chronic myeloproliferative disease: Secondary | ICD-10-CM

## 2018-09-15 DIAGNOSIS — Z87891 Personal history of nicotine dependence: Secondary | ICD-10-CM | POA: Diagnosis not present

## 2018-09-15 DIAGNOSIS — D473 Essential (hemorrhagic) thrombocythemia: Secondary | ICD-10-CM | POA: Diagnosis not present

## 2018-09-15 LAB — COMPREHENSIVE METABOLIC PANEL
ALT: 10 U/L (ref 0–44)
AST: 12 U/L — ABNORMAL LOW (ref 15–41)
Albumin: 3.8 g/dL (ref 3.5–5.0)
Alkaline Phosphatase: 60 U/L (ref 38–126)
Anion gap: 7 (ref 5–15)
BUN: 17 mg/dL (ref 8–23)
CO2: 28 mmol/L (ref 22–32)
Calcium: 8.3 mg/dL — ABNORMAL LOW (ref 8.9–10.3)
Chloride: 102 mmol/L (ref 98–111)
Creatinine, Ser: 0.73 mg/dL (ref 0.44–1.00)
GFR calc Af Amer: >60 mL/min (ref >60)
GFR calc non Af Amer: >60 mL/min (ref >60)
Glucose, Bld: 105 mg/dL — ABNORMAL HIGH (ref 70–99)
Potassium: 4.7 mmol/L (ref 3.5–5.1)
Sodium: 137 mmol/L (ref 135–145)
Total Bilirubin: 0.7 mg/dL (ref 0.3–1.2)
Total Protein: 6.2 g/dL — ABNORMAL LOW (ref 6.5–8.1)

## 2018-09-15 LAB — CBC WITH DIFFERENTIAL/PLATELET
Abs Immature Granulocytes: 0.02 10*3/uL (ref 0.00–0.07)
Basophils Absolute: 0.1 10*3/uL (ref 0.0–0.1)
Basophils Relative: 1 %
Eosinophils Absolute: 0.2 10*3/uL (ref 0.0–0.5)
Eosinophils Relative: 3 %
HCT: 38.7 % (ref 36.0–46.0)
Hemoglobin: 11.9 g/dL — ABNORMAL LOW (ref 12.0–15.0)
Immature Granulocytes: 0 %
Lymphocytes Relative: 16 %
Lymphs Abs: 1.1 10*3/uL (ref 0.7–4.0)
MCH: 36.2 pg — ABNORMAL HIGH (ref 26.0–34.0)
MCHC: 30.7 g/dL (ref 30.0–36.0)
MCV: 117.6 fL — ABNORMAL HIGH (ref 80.0–100.0)
Monocytes Absolute: 0.8 10*3/uL (ref 0.1–1.0)
Monocytes Relative: 11 %
Neutro Abs: 5 10*3/uL (ref 1.7–7.7)
Neutrophils Relative %: 69 %
Platelets: 426 10*3/uL — ABNORMAL HIGH (ref 150–400)
RBC: 3.29 MIL/uL — ABNORMAL LOW (ref 3.87–5.11)
RDW: 13.2 % (ref 11.5–15.5)
WBC: 7.2 10*3/uL (ref 4.0–10.5)
nRBC: 0 % (ref 0.0–0.2)

## 2018-09-15 LAB — LACTATE DEHYDROGENASE: LDH: 183 U/L (ref 98–192)

## 2018-09-15 NOTE — Progress Notes (Signed)
For review.  Please update ordering provider

## 2018-09-17 ENCOUNTER — Inpatient Hospital Stay (HOSPITAL_BASED_OUTPATIENT_CLINIC_OR_DEPARTMENT_OTHER): Payer: Medicare Other | Admitting: Nurse Practitioner

## 2018-09-17 ENCOUNTER — Encounter (HOSPITAL_COMMUNITY): Payer: Self-pay | Admitting: Nurse Practitioner

## 2018-09-17 ENCOUNTER — Other Ambulatory Visit: Payer: Self-pay

## 2018-09-17 VITALS — BP 116/50 | HR 54 | Temp 97.6°F | Resp 18 | Wt 159.0 lb

## 2018-09-17 DIAGNOSIS — Z87891 Personal history of nicotine dependence: Secondary | ICD-10-CM | POA: Diagnosis not present

## 2018-09-17 DIAGNOSIS — D473 Essential (hemorrhagic) thrombocythemia: Secondary | ICD-10-CM

## 2018-09-17 DIAGNOSIS — D471 Chronic myeloproliferative disease: Secondary | ICD-10-CM

## 2018-09-17 DIAGNOSIS — Z122 Encounter for screening for malignant neoplasm of respiratory organs: Secondary | ICD-10-CM

## 2018-09-17 DIAGNOSIS — R05 Cough: Secondary | ICD-10-CM

## 2018-09-17 DIAGNOSIS — Z7982 Long term (current) use of aspirin: Secondary | ICD-10-CM | POA: Diagnosis not present

## 2018-09-17 NOTE — Assessment & Plan Note (Addendum)
1.  Jordan [name] essential thrombocytosis: - Patient is high risk for thrombus given that she has had a previous stroke.  She is also over the age of 61.  She is not on anticoagulation due to her weakness in regards for fall risk.  However she is on aspirin 81 mg p.o. daily. -Patient is on Hydrea 500 mg twice daily. -She denies any leg ulcers or pruritis. -Labs on 09/15/2018 showed hemoglobin 11.9, hematocrit 38.7, platelets 426, WBC 7.2. -Counts have remained stable with the Hydrea. -Goal is platelet count of less than or equal to 450. - She will follow-up in 4 months with repeat labs.  2.  New cough with blood-tinged sputum: -Patient reports she smoked from the age of 28 till she was 4. - She has never had a low-dose CT scan. -We will set her up with 1 prior to her next visit.

## 2018-09-17 NOTE — Patient Instructions (Signed)
[facility] at [facility] Discharge Instructions     Thank you for choosing [facility] at [facility] to provide your oncology and hematology care.  To afford each patient quality time with our provider, please arrive at least 15 minutes before your scheduled appointment time.   If you have a lab appointment with the Cancer Center please come in thru the  Main Entrance and check in at the main information desk  You need to re-schedule your appointment should you arrive 10 or more minutes late.  We strive to give you quality time with our providers, and arriving late affects you and other patients whose appointments are after yours.  Also, if you no show three or more times for appointments you may be dismissed from the clinic at the providers discretion.     Again, thank you for choosing [facility].  Our hope is that these requests will decrease the amount of time that you wait before being seen by our physicians.       _____________________________________________________________  Should you have questions after your visit to [facility], please contact our office at [phone] between the hours of 8:00 a.m. and 4:30 p.m.  Voicemails left after 4:00 p.m. will not be returned until the following business day.  For prescription refill requests, have your pharmacy contact our office and allow 72 hours.    Cancer Center Support Programs:   > Cancer Support Group  2nd Tuesday of the month 1pm-2pm, Journey Room    

## 2018-09-17 NOTE — Progress Notes (Signed)
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 female returns for routine follow-up for Jak 2+ essential thrombocytosis.  She reports she has been doing well since her last visit she reports she does get short of breath at times.  And she has a new cough with blood-tinged sputum over the last 2 months. Denies any nausea, vomiting, or diarrhea. Denies any new pains. Had not noticed any recent bleeding such as epistaxis, hematuria or hematochezia. Denies recent chest pain on exertion,, pre-syncopal episodes, or palpitations. Denies any numbness or tingling in hands or feet. Denies any recent fevers, infections, or recent hospitalizations. Patient reports appetite at 75 % and energy level at 50 %. She is eating well and maintaining her weight at this time.     REVIEW OF SYSTEMS:  Review of Systems  Constitutional: Positive for fatigue.  Respiratory: Positive for cough and hemoptysis.   Gastrointestinal: Positive for constipation.  All other systems reviewed and are negative.    PAST MEDICAL/SURGICAL HISTORY:  Past Medical History:  Diagnosis Date  . Anxiety   . Arthritis   . Cancer [facility])    Skin cancer  . Cataracts, both eyes 05/2014  . COPD (chronic obstructive pulmonary disease) (HCC)    intermittent home O2 - rarely uses  . Depression   . Diabetes mellitus without complication ([place])   . GERD (gastroesophageal reflux disease)   . Hyperkalemia   . Hyperlipidemia   . Hypertension   . Seizures ([place])    From RH Negative blood when pregnant  . Stroke ([place])   . Thrombocytosis ([place])   . Thyroid disease    Past Surgical History:  Procedure Laterality Date  . ABDOMINAL HYSTERECTOMY     . ANKLE SURGERY Right   . BREAST SURGERY     Bx, none malignant  . COLONOSCOPY  2013   normal per patient  . COLONOSCOPY N/A 05/14/2016   Procedure: COLONOSCOPY;  Surgeon: Danie Binder, MD;  Location: [facility];  Service: Endoscopy;  Laterality: N/A;  8:30AM  . ESOPHAGOGASTRODUODENOSCOPY N/A 01/14/2016   Dr. Oneida Alar: normal esophagus, single hyperplastic gastric polyp, normal duodenum, negative H.pylori  . ROTATOR CUFF REPAIR Right   . Skin Cancers    . TONSILLECTOMY    . TONSILLECTOMY       SOCIAL HISTORY:  Social History   Socioeconomic History  . Marital status: Married    Spouse name: Carleene Overlie  . Number of children: 1  . Years of education: 7  . Highest education level: Associate degree: academic program  Occupational History  . Occupation: retired  Scientific laboratory technician  . Financial resource strain: Not hard at all  . Food insecurity    Worry: Never true    Inability: Never true  . Transportation needs    Medical: No    Non-medical: No  Tobacco Use  . Smoking status: Former Smoker    Packs/day: 3.00    Years: 36.00    Pack years: 108.00    Types: Cigarettes    Quit date: 03/10/2000    Years since quitting: 18.5  . Smokeless tobacco: Never Used  Substance and Sexual Activity  . Alcohol use: No  . Drug  use: No  . Sexual activity: Not Currently  Lifestyle  . Physical activity    Days per week: 0 days    Minutes per session: 0 min  . Stress: Not at all  Relationships  . Social connections    Talks on phone: More than three times a week    Gets together: More than three times a week    Attends religious service: Never    Active member of club or organization: No    Attends meetings of clubs or organizations: Never    Relationship status: Married  . Intimate partner violence    Fear of current or ex partner: No    Emotionally abused: No    Physically abused: No    Forced sexual activity: No  Other Topics Concern  . Not on file  Social History Narrative  . Not  on file    FAMILY HISTORY:  Family History  Problem Relation Age of Onset  . Alzheimer's disease Mother   . Arthritis Father   . Stroke Maternal Aunt     CURRENT MEDICATIONS:  Outpatient Encounter Medications as of 09/17/2018  Medication Sig  . aspirin EC 81 MG tablet Take 81 mg by mouth daily.  . Fluticasone-Umeclidin-Vilant (TRELEGY ELLIPTA) 100-62.5-25 MCG/INH AEPB Inhale 1 puff into the lungs daily.  Marland Kitchen gabapentin (NEURONTIN) 300 MG capsule TAKE 1 CAPSULE BY MOUTH EVERYDAY AT BEDTIME  . guaiFENesin (MUCINEX) 600 MG 12 hr tablet Take 1 tablet (600 mg total) by mouth 2 (two) times daily.  . hydroxyurea (HYDREA) 500 MG capsule Take 1 capsule (500 mg total) by mouth 2 (two) times daily. May take with food to minimize GI side effects.  Marland Kitchen levothyroxine (SYNTHROID, LEVOTHROID) 112 MCG tablet Take 1 tablet (112 mcg total) by mouth daily.  Marland Kitchen LORazepam (ATIVAN) 1 MG tablet Take 1 tablet (1 mg total) by mouth 2 (two) times daily as needed for anxiety.  . metFORMIN (GLUCOPHAGE) 500 MG tablet Take 1 tablet (500 mg total) by mouth 2 (two) times daily with a meal.  . metoprolol succinate (TOPROL-XL) 25 MG 24 hr tablet Take 1 tablet (25 mg total) by mouth daily. Needs OV for further fills  . Multiple Vitamins-Minerals (ICAPS AREDS 2 PO) Take 1 capsule by mouth 2 (two) times daily.  Marland Kitchen omeprazole (PRILOSEC) 20 MG capsule TAKE ONE CAPSULE BY MOUTH EVERY MORNING 30 MINUTES PRIOR TO YOUR FIRST MEAL  . OXYGEN Inhale 2 L into the lungs.  Marland Kitchen PARoxetine (PAXIL) 40 MG tablet Take 1 tablet (40 mg total) by mouth daily.  . traZODone (DESYREL) 100 MG tablet TAKE 1/2 TO 1 TABLET AT BEDTIME (TAKES 1 TABLET)  . fexofenadine-pseudoephedrine (ALLEGRA-D 24) 180-240 MG 24 hr tablet Take 1 tablet by mouth as needed (allergies).    No facility-administered encounter medications on file as of 09/17/2018.     ALLERGIES:  Allergies  Allergen Reactions  . Bee Venom Anaphylaxis  . Codeine Itching  . Lipitor  [Atorvastatin]     Extreme Dry Mouth, joint pain  . Penicillins Hives    Has patient had a PCN reaction causing immediate rash, facial/tongue/throat swelling, SOB or lightheadedness with hypotension: No Has patient had a PCN reaction causing severe rash involving mucus membranes or skin necrosis: Yes Has patient had a PCN reaction that required hospitalization: No Has patient had a PCN reaction occurring within the last 10 years: No If all of the above answers are "NO", then may proceed with Cephalosporin use.   . Demeclocycline Rash  .  Morphine And Related Rash  . Tetracyclines & Related Rash     PHYSICAL EXAM:  ECOG Performance status: 1  Vitals:   09/17/18 1445  BP: (!) 116/50  Pulse: (!) 54  Resp: 18  Temp: 97.6 F (36.4 C)   Filed Weights   09/17/18 1445  Weight: 159 lb (72.1 kg)    Physical Exam Constitutional:      Appearance: Normal appearance. She is normal weight.  Cardiovascular:     Rate and Rhythm: Normal rate and regular rhythm.     Heart sounds: Normal heart sounds.  Pulmonary:     Effort: Pulmonary effort is normal.     Breath sounds: Normal breath sounds.  Abdominal:     General: Bowel sounds are normal.     Palpations: Abdomen is soft.  Musculoskeletal: Normal range of motion.  Skin:    General: Skin is warm and dry.  Neurological:     Mental Status: She is alert and oriented to person, place, and time. Mental status is at baseline.  Psychiatric:        Mood and Affect: Mood normal.        Behavior: Behavior normal.        Thought Content: Thought content normal.        Judgment: Judgment normal.      LABORATORY DATA:  I have reviewed the labs as listed.  CBC    Component Value Date/Time   WBC 7.2 09/15/2018 1427   RBC 3.29 (L) 09/15/2018 1427   HGB 11.9 (L) 09/15/2018 1427   HGB 14.8 11/22/2015 1123   HCT 38.7 09/15/2018 1427   HCT 46.7 (H) 11/22/2015 1123   PLT 426 (H) 09/15/2018 1427   PLT 598 (H) 11/22/2015 1123   MCV 117.6  (H) 09/15/2018 1427   MCV 73 (L) 11/22/2015 1123   MCH 36.2 (H) 09/15/2018 1427   MCHC 30.7 09/15/2018 1427   RDW 13.2 09/15/2018 1427   RDW 18.7 (H) 11/22/2015 1123   LYMPHSABS 1.1 09/15/2018 1427   LYMPHSABS 1.6 11/22/2015 1123   MONOABS 0.8 09/15/2018 1427   EOSABS 0.2 09/15/2018 1427   EOSABS 0.5 (H) 11/22/2015 1123   BASOSABS 0.1 09/15/2018 1427   BASOSABS 0.1 11/22/2015 1123   CMP Latest Ref Rng & Units 09/15/2018 02/12/2018 09/17/2017  Glucose 70 - 99 mg/dL 105(H) 107(H) 139(H)  BUN 8 - 23 mg/dL 17 16 11   Creatinine 0.44 - 1.00 mg/dL 0.73 0.70 0.82  Sodium 135 - 145 mmol/L 137 139 139  Potassium 3.5 - 5.1 mmol/L 4.7 4.0 4.0  Chloride 98 - 111 mmol/L 102 104 101  CO2 22 - 32 mmol/L 28 26 30   Calcium 8.9 - 10.3 mg/dL 8.3(L) 9.0 8.9  Total Protein 6.5 - 8.1 g/dL 6.2(L) 6.9 6.7  Total Bilirubin 0.3 - 1.2 mg/dL 0.7 0.8 0.7  Alkaline Phos 38 - 126 U/L 60 57 69  AST 15 - 41 U/L 12(L) 17 14(L)  ALT 0 - 44 U/L 10 10 9     I personally performed a face-to-face visit.  All questions were answered to patient's stated satisfaction. Encouraged patient to call with any new concerns or questions before his next visit to the cancer center and we can certain see him sooner, if needed.     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  Jak 2 V6 36F essential thrombocytosis: - Patient is high risk for thrombus given that she has had a previous stroke.  She is also over  the age of 52.  She is not on anticoagulation due to her weakness in regards for fall risk.  However she is on aspirin 81 mg p.o. daily. -Patient is on Hydrea 500 mg twice daily. -She denies any leg ulcers or pruritis. -Labs on 09/15/2018 showed hemoglobin 11.9, hematocrit 38.7, platelets 426, WBC 7.2. -Counts have remained stable with the Hydrea. -Goal is platelet count of less than or equal to 450. - She will follow-up in 4 months with repeat labs.  2.  New cough with blood-tinged sputum: -Patient reports she  smoked from the age of 49 till she was 29. - She has never had a low-dose CT scan. -We will set her up with 1 prior to her next visit.       Orders placed this encounter:  Orders Placed This Encounter  Procedures  . CT CHEST LUNG CA SCREEN LOW DOSE W/O CM  . Lactate dehydrogenase  . CBC with Differential/Platelet  . Comprehensive metabolic panel  . Ferritin  . Iron and TIBC  . Vitamin B12  . VITAMIN D 25 Hydroxy (Vit-D Deficiency, Fractures)      Francene Finders, FNP-C [place] [phone]

## 2018-09-21 ENCOUNTER — Other Ambulatory Visit (HOSPITAL_COMMUNITY): Payer: Self-pay | Admitting: Nurse Practitioner

## 2018-09-21 DIAGNOSIS — R042 Hemoptysis: Secondary | ICD-10-CM

## 2018-10-15 ENCOUNTER — Ambulatory Visit: Payer: Medicare Other | Admitting: *Deleted

## 2018-11-11 ENCOUNTER — Other Ambulatory Visit: Payer: Self-pay | Admitting: Physician Assistant

## 2018-12-09 ENCOUNTER — Other Ambulatory Visit: Payer: Self-pay | Admitting: Physician Assistant

## 2018-12-10 NOTE — Telephone Encounter (Signed)
Jones. NTBS 30 days given 11/11/18

## 2018-12-10 NOTE — Telephone Encounter (Signed)
Attempted to contact patient - NA °

## 2018-12-23 ENCOUNTER — Other Ambulatory Visit: Payer: Self-pay

## 2018-12-23 ENCOUNTER — Other Ambulatory Visit: Payer: Self-pay | Admitting: Physician Assistant

## 2018-12-24 ENCOUNTER — Ambulatory Visit (INDEPENDENT_AMBULATORY_CARE_PROVIDER_SITE_OTHER): Payer: Medicare Other | Admitting: Physician Assistant

## 2018-12-24 ENCOUNTER — Other Ambulatory Visit: Payer: Self-pay

## 2018-12-24 ENCOUNTER — Ambulatory Visit: Payer: Medicare Other

## 2018-12-24 ENCOUNTER — Other Ambulatory Visit: Payer: Self-pay | Admitting: Physician Assistant

## 2018-12-24 ENCOUNTER — Encounter: Payer: Self-pay | Admitting: Physician Assistant

## 2018-12-24 VITALS — BP 96/59 | HR 139 | Temp 97.3°F | Resp 20 | Ht 66.0 in | Wt 158.0 lb

## 2018-12-24 DIAGNOSIS — D471 Chronic myeloproliferative disease: Secondary | ICD-10-CM

## 2018-12-24 DIAGNOSIS — Z23 Encounter for immunization: Secondary | ICD-10-CM

## 2018-12-24 DIAGNOSIS — J449 Chronic obstructive pulmonary disease, unspecified: Secondary | ICD-10-CM | POA: Diagnosis not present

## 2018-12-24 DIAGNOSIS — F419 Anxiety disorder, unspecified: Secondary | ICD-10-CM | POA: Diagnosis not present

## 2018-12-24 DIAGNOSIS — J4489 Other specified chronic obstructive pulmonary disease: Secondary | ICD-10-CM

## 2018-12-24 MED ORDER — AZITHROMYCIN 250 MG PO TABS: ORAL_TABLET | ORAL | 1 refills | Status: DC

## 2018-12-24 MED ORDER — LORAZEPAM 1 MG PO TABS: 1.0000 mg | ORAL_TABLET | Freq: Two times a day (BID) | ORAL | 2 refills | Status: DC | PRN

## 2018-12-26 NOTE — Progress Notes (Signed)
BP (!) 96/59   Pulse (!) 139   Temp (!) 97.3 F (36.3 C)   Resp 20   Ht 5\' 6"  (1.676 m)   Wt 158 lb (71.7 kg)   SpO2 92% Comment: on 2 liters  BMI 25.50 kg/m    Subjective:    Patient ID: Jordan [name], female    DOB: 01/01/1941, 78 y.o.   MRN: [id number]  HPI: Jordan [name] is a 78 y.o. female presenting on 12/24/2018 for Medical Management of Chronic Issues (refills )  This patient is accompanied by her husband for an interval check up on her chronic medical conditions. Her past medical history is positive for hypertension, asthma COPD, myeloproliferative disease followed by oncology, GERD, hypothyroidism, neuropathy.  She and her husband states that overall she is fairly stable.  She is going to be moving to [place] with her daughter.  House was bought from her and her husband to move in with some.  She feels bad about them having to do so much work and her not able to help.  She states that she is having a little bit of exacerbation of her COPD.  And generally gets a bronchitis or 2 each winter.  I have sent her a Zithromax pack for her to have on hand if she starts with symptoms.  She will continue with oncology for now.  Past Medical History:  Diagnosis Date  . Anxiety   . Arthritis   . Cancer [facility])    Skin cancer  . Cataracts, both eyes 05/2014  . COPD (chronic obstructive pulmonary disease) (HCC)    intermittent home O2 - rarely uses  . Depression   . Diabetes mellitus without complication ([place])   . GERD (gastroesophageal reflux disease)   . Hyperkalemia   . Hyperlipidemia   . Hypertension   . Seizures ([place])    From RH Negative blood when pregnant  . Stroke ([place])   . Thrombocytosis ([place])   . Thyroid disease    Relevant past medical, surgical, family and social history reviewed and updated as indicated. Interim medical history since our last visit reviewed. Allergies and medications reviewed and updated. DATA REVIEWED: CHART IN EPIC  Family History reviewed for pertinent  findings.  Review of Systems  Constitutional: Negative.  Negative for activity change, fatigue, fever and unexpected weight change.  HENT: Negative.   Eyes: Negative.   Respiratory: Positive for cough, shortness of breath and wheezing.   Cardiovascular: Negative.  Negative for chest pain.  Gastrointestinal: Negative.  Negative for abdominal pain.  Endocrine: Negative.   Genitourinary: Negative.  Negative for dysuria.  Musculoskeletal: Negative.   Skin: Negative.   Neurological: Positive for weakness.    Allergies as of 12/24/2018      Reactions   Bee Venom Anaphylaxis   Codeine Itching   Lipitor [atorvastatin]    Extreme Dry Mouth, joint pain   Penicillins Hives   Has patient had a PCN reaction causing immediate rash, facial/tongue/throat swelling, SOB or lightheadedness with hypotension: No Has patient had a PCN reaction causing severe rash involving mucus membranes or skin necrosis: Yes Has patient had a PCN reaction that required hospitalization: No Has patient had a PCN reaction occurring within the last 10 years: No If all of the above answers are "NO", then may proceed with Cephalosporin use.   Demeclocycline Rash   Morphine And Related Rash   Tetracyclines & Related Rash      Medication List       Accurate as  of December 24, 2018 11:59 PM. If you have any questions, ask your nurse or doctor.        STOP taking these medications   omeprazole 20 MG capsule Commonly known as: PRILOSEC Stopped by: Terald Sleeper, PA-C     TAKE these medications   aspirin EC 81 MG tablet Take 81 mg by mouth daily.   azithromycin 250 MG tablet Commonly known as: Zithromax Z-Pak Take as directed Started by: Terald Sleeper, PA-C   fexofenadine-pseudoephedrine 180-240 MG 24 hr tablet Commonly known as: ALLEGRA-D 24 Take 1 tablet by mouth as needed (allergies).   Fluticasone-Umeclidin-Vilant 100-62.5-25 MCG/INH Aepb Commonly known as: Trelegy Ellipta Inhale 1 puff into the lungs  daily.   gabapentin 300 MG capsule Commonly known as: NEURONTIN TAKE 1 CAPSULE BY MOUTH EVERYDAY AT BEDTIME   guaiFENesin 600 MG 12 hr tablet Commonly known as: MUCINEX Take 1 tablet (600 mg total) by mouth 2 (two) times daily.   hydroxyurea 500 MG capsule Commonly known as: HYDREA Take 1 capsule (500 mg total) by mouth 2 (two) times daily. May take with food to minimize GI side effects.   ICAPS AREDS 2 PO Take 1 capsule by mouth 2 (two) times daily.   levothyroxine 112 MCG tablet Commonly known as: SYNTHROID Take 1 tablet (112 mcg total) by mouth daily.   LORazepam 1 MG tablet Commonly known as: ATIVAN Take 1 tablet (1 mg total) by mouth 2 (two) times daily as needed for anxiety.   metFORMIN 500 MG tablet Commonly known as: GLUCOPHAGE Take 1 tablet (500 mg total) by mouth 2 (two) times daily with a meal.   metoprolol succinate 25 MG 24 hr tablet Commonly known as: TOPROL-XL Take 1 tablet (25 mg total) by mouth daily. Needs OV for further fills   OXYGEN Inhale 2 L into the lungs.   PARoxetine 40 MG tablet Commonly known as: PAXIL Take 1 tablet (40 mg total) by mouth daily. (Needs to be seen before next refill)   traZODone 100 MG tablet Commonly known as: DESYREL TAKE 1/2 TO 1 TABLET AT BEDTIME (TAKES 1 TABLET)          Objective:    BP (!) 96/59   Pulse (!) 139   Temp (!) 97.3 F (36.3 C)   Resp 20   Ht 5\' 6"  (1.676 m)   Wt 158 lb (71.7 kg)   SpO2 92% Comment: on 2 liters  BMI 25.50 kg/m   Allergies  Allergen Reactions  . Bee Venom Anaphylaxis  . Codeine Itching  . Lipitor [Atorvastatin]     Extreme Dry Mouth, joint pain  . Penicillins Hives    Has patient had a PCN reaction causing immediate rash, facial/tongue/throat swelling, SOB or lightheadedness with hypotension: No Has patient had a PCN reaction causing severe rash involving mucus membranes or skin necrosis: Yes Has patient had a PCN reaction that required hospitalization: No Has patient  had a PCN reaction occurring within the last 10 years: No If all of the above answers are "NO", then may proceed with Cephalosporin use.   . Demeclocycline Rash  . Morphine And Related Rash  . Tetracyclines & Related Rash    Wt Readings from Last 3 Encounters:  12/24/18 158 lb (71.7 kg)  09/17/18 159 lb (72.1 kg)  03/15/18 164 lb 4.8 oz (74.5 kg)    Physical Exam Constitutional:      Appearance: She is well-developed.  HENT:     Head: Normocephalic and atraumatic.  Right Ear: Tympanic membrane, ear canal and external ear normal.     Left Ear: Tympanic membrane, ear canal and external ear normal.     Nose: Nose normal. No rhinorrhea.     Mouth/Throat:     Pharynx: No oropharyngeal exudate or posterior oropharyngeal erythema.  Eyes:     Conjunctiva/sclera: Conjunctivae normal.     Pupils: Pupils are equal, round, and reactive to light.  Neck:     Musculoskeletal: Normal range of motion and neck supple.  Cardiovascular:     Rate and Rhythm: Normal rate and regular rhythm.     Heart sounds: Normal heart sounds.  Pulmonary:     Effort: Pulmonary effort is normal.     Breath sounds: Normal breath sounds.  Abdominal:     General: Bowel sounds are normal.     Palpations: Abdomen is soft.  Skin:    General: Skin is warm and dry.     Findings: No rash.  Neurological:     Mental Status: She is alert and oriented to person, place, and time.     Deep Tendon Reflexes: Reflexes are normal and symmetric.  Psychiatric:        Behavior: Behavior normal.        Thought Content: Thought content normal.        Judgment: Judgment normal.     Results for orders placed or performed in visit on 09/15/18  CBC with Differential/Platelet  Result Value Ref Range   WBC 7.2 4.0 - 10.5 K/uL   RBC 3.29 (L) 3.87 - 5.11 MIL/uL   Hemoglobin 11.9 (L) 12.0 - 15.0 g/dL   HCT 38.7 36.0 - 46.0 %   MCV 117.6 (H) 80.0 - 100.0 fL   MCH 36.2 (H) 26.0 - 34.0 pg   MCHC 30.7 30.0 - 36.0 g/dL   RDW  13.2 11.5 - 15.5 %   Platelets 426 (H) 150 - 400 K/uL   nRBC 0.0 0.0 - 0.2 %   Neutrophils Relative % 69 %   Neutro Abs 5.0 1.7 - 7.7 K/uL   Lymphocytes Relative 16 %   Lymphs Abs 1.1 0.7 - 4.0 K/uL   Monocytes Relative 11 %   Monocytes Absolute 0.8 0.1 - 1.0 K/uL   Eosinophils Relative 3 %   Eosinophils Absolute 0.2 0.0 - 0.5 K/uL   Basophils Relative 1 %   Basophils Absolute 0.1 0.0 - 0.1 K/uL   Immature Granulocytes 0 %   Abs Immature Granulocytes 0.02 0.00 - 0.07 K/uL   Ovalocytes PRESENT   Comprehensive metabolic panel  Result Value Ref Range   Sodium 137 135 - 145 mmol/L   Potassium 4.7 3.5 - 5.1 mmol/L   Chloride 102 98 - 111 mmol/L   CO2 28 22 - 32 mmol/L   Glucose, Bld 105 (H) 70 - 99 mg/dL   BUN 17 8 - 23 mg/dL   Creatinine, Ser 0.73 0.44 - 1.00 mg/dL   Calcium 8.3 (L) 8.9 - 10.3 mg/dL   Total Protein 6.2 (L) 6.5 - 8.1 g/dL   Albumin 3.8 3.5 - 5.0 g/dL   AST 12 (L) 15 - 41 U/L   ALT 10 0 - 44 U/L   Alkaline Phosphatase 60 38 - 126 U/L   Total Bilirubin 0.7 0.3 - 1.2 mg/dL   GFR calc non Af Amer >60 >60 mL/min   GFR calc Af Amer >60 >60 mL/min   Anion gap 7 5 - 15  Lactate dehydrogenase  Result Value Ref  Range   LDH 183 98 - 192 U/L      Assessment & Plan:   1. Chronic obstructive pulmonary disease, unspecified COPD type (HCC) - azithromycin (ZITHROMAX Z-PAK) 250 MG tablet; Take as directed  Dispense: 6 each; Refill: 1  2. 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 Need for immunization against influenza - Flu Vaccine QUAD High Dose(Fluad)  4. Asthma with COPD ([place]) - azithromycin (ZITHROMAX Z-PAK) 250 MG tablet; Take as directed  Dispense: 6 each; Refill: 1  5. Anxiety - LORazepam (ATIVAN) 1 MG tablet; Take 1 tablet (1 mg total) by mouth 2 (two) times daily as needed for anxiety.  Dispense: 30 tablet; Refill: 2    Continue all other maintenance medications as listed above.  Follow up plan: No follow-ups on file.  Educational  handout given for [place] PA-C [place] [address]  [place], [place] [address] [phone]   12/26/2018, 8:54 PM

## 2019-01-05 ENCOUNTER — Other Ambulatory Visit: Payer: Self-pay | Admitting: Physician Assistant

## 2019-01-05 DIAGNOSIS — J449 Chronic obstructive pulmonary disease, unspecified: Secondary | ICD-10-CM

## 2019-01-06 ENCOUNTER — Other Ambulatory Visit: Payer: Self-pay | Admitting: Physician Assistant

## 2019-01-18 ENCOUNTER — Other Ambulatory Visit: Payer: Self-pay

## 2019-01-18 ENCOUNTER — Inpatient Hospital Stay (HOSPITAL_COMMUNITY): Payer: Medicare Other | Attending: Nurse Practitioner

## 2019-01-18 ENCOUNTER — Ambulatory Visit (HOSPITAL_COMMUNITY)
Admission: RE | Admit: 2019-01-18 | Discharge: 2019-01-18 | Disposition: A | Discharge status: Home / Self Care | Payer: Medicare Other | Source: Ambulatory Visit | Attending: Nurse Practitioner | Admitting: Nurse Practitioner

## 2019-01-18 DIAGNOSIS — R05 Cough: Secondary | ICD-10-CM | POA: Insufficient documentation

## 2019-01-18 DIAGNOSIS — C946 Myelodysplastic disease, not classified: Secondary | ICD-10-CM | POA: Insufficient documentation

## 2019-01-18 DIAGNOSIS — D473 Essential (hemorrhagic) thrombocythemia: Secondary | ICD-10-CM | POA: Diagnosis not present

## 2019-01-18 DIAGNOSIS — J439 Emphysema, unspecified: Secondary | ICD-10-CM | POA: Diagnosis not present

## 2019-01-18 DIAGNOSIS — R0609 Other forms of dyspnea: Secondary | ICD-10-CM | POA: Insufficient documentation

## 2019-01-18 DIAGNOSIS — R042 Hemoptysis: Secondary | ICD-10-CM | POA: Insufficient documentation

## 2019-01-18 DIAGNOSIS — Z87891 Personal history of nicotine dependence: Secondary | ICD-10-CM | POA: Diagnosis not present

## 2019-01-18 DIAGNOSIS — D471 Chronic myeloproliferative disease: Secondary | ICD-10-CM

## 2019-01-18 LAB — CBC WITH DIFFERENTIAL/PLATELET
Abs Immature Granulocytes: 0.03 10*3/uL (ref 0.00–0.07)
Basophils Absolute: 0.1 10*3/uL (ref 0.0–0.1)
Basophils Relative: 1 %
Eosinophils Absolute: 0.2 10*3/uL (ref 0.0–0.5)
Eosinophils Relative: 3 %
HCT: 39.5 % (ref 36.0–46.0)
Hemoglobin: 12.3 g/dL (ref 12.0–15.0)
Immature Granulocytes: 0 %
Lymphocytes Relative: 18 %
Lymphs Abs: 1.3 10*3/uL (ref 0.7–4.0)
MCH: 36.7 pg — ABNORMAL HIGH (ref 26.0–34.0)
MCHC: 31.1 g/dL (ref 30.0–36.0)
MCV: 117.9 fL — ABNORMAL HIGH (ref 80.0–100.0)
Monocytes Absolute: 0.8 10*3/uL (ref 0.1–1.0)
Monocytes Relative: 11 %
Neutro Abs: 5.2 10*3/uL (ref 1.7–7.7)
Neutrophils Relative %: 67 %
Platelets: 418 10*3/uL — ABNORMAL HIGH (ref 150–400)
RBC: 3.35 MIL/uL — ABNORMAL LOW (ref 3.87–5.11)
RDW: 16.1 % — ABNORMAL HIGH (ref 11.5–15.5)
WBC: 7.7 10*3/uL (ref 4.0–10.5)
nRBC: 0 % (ref 0.0–0.2)

## 2019-01-18 LAB — COMPREHENSIVE METABOLIC PANEL
ALT: 9 U/L (ref 0–44)
AST: 15 U/L (ref 15–41)
Albumin: 4 g/dL (ref 3.5–5.0)
Alkaline Phosphatase: 65 U/L (ref 38–126)
Anion gap: 11 (ref 5–15)
BUN: 16 mg/dL (ref 8–23)
CO2: 23 mmol/L (ref 22–32)
Calcium: 8.7 mg/dL — ABNORMAL LOW (ref 8.9–10.3)
Chloride: 102 mmol/L (ref 98–111)
Creatinine, Ser: 1.11 mg/dL — ABNORMAL HIGH (ref 0.44–1.00)
GFR calc Af Amer: 55 mL/min — ABNORMAL LOW (ref >60)
GFR calc non Af Amer: 47 mL/min — ABNORMAL LOW (ref >60)
Glucose, Bld: 108 mg/dL — ABNORMAL HIGH (ref 70–99)
Potassium: 4.6 mmol/L (ref 3.5–5.1)
Sodium: 136 mmol/L (ref 135–145)
Total Bilirubin: 0.9 mg/dL (ref 0.3–1.2)
Total Protein: 6.5 g/dL (ref 6.5–8.1)

## 2019-01-18 LAB — FERRITIN: Ferritin: 20 ng/mL (ref 11–307)

## 2019-01-18 LAB — IRON AND TIBC
Iron: 59 ug/dL (ref 28–170)
Saturation Ratios: 15 % (ref 10.4–31.8)
TIBC: 385 ug/dL (ref 250–450)
UIBC: 326 ug/dL

## 2019-01-18 LAB — VITAMIN B12: Vitamin B-12: 115 pg/mL — ABNORMAL LOW (ref 180–914)

## 2019-01-18 LAB — VITAMIN D 25 HYDROXY (VIT D DEFICIENCY, FRACTURES): Vit D, 25-Hydroxy: 10.28 ng/mL — ABNORMAL LOW (ref 30–100)

## 2019-01-18 LAB — LACTATE DEHYDROGENASE: LDH: 203 U/L — ABNORMAL HIGH (ref 98–192)

## 2019-01-18 MED ORDER — IOHEXOL 300 MG/ML  SOLN
75.0000 mL | Freq: Once | INTRAMUSCULAR | Status: AC | PRN
  Administered 2019-01-18: 75 mL via INTRAVENOUS

## 2019-01-19 ENCOUNTER — Other Ambulatory Visit: Payer: Self-pay | Admitting: Physician Assistant

## 2019-01-19 DIAGNOSIS — D471 Chronic myeloproliferative disease: Secondary | ICD-10-CM

## 2019-01-20 ENCOUNTER — Other Ambulatory Visit: Payer: Self-pay

## 2019-01-21 ENCOUNTER — Inpatient Hospital Stay (HOSPITAL_BASED_OUTPATIENT_CLINIC_OR_DEPARTMENT_OTHER): Payer: Medicare Other | Admitting: Nurse Practitioner

## 2019-01-21 ENCOUNTER — Other Ambulatory Visit (HOSPITAL_COMMUNITY): Payer: Medicare Other

## 2019-01-21 VITALS — BP 98/67 | HR 106 | Temp 97.8°F | Resp 21 | Wt 158.0 lb

## 2019-01-21 DIAGNOSIS — D473 Essential (hemorrhagic) thrombocythemia: Secondary | ICD-10-CM | POA: Diagnosis not present

## 2019-01-21 DIAGNOSIS — E538 Deficiency of other specified B group vitamins: Secondary | ICD-10-CM

## 2019-01-21 DIAGNOSIS — K5900 Constipation, unspecified: Secondary | ICD-10-CM | POA: Diagnosis not present

## 2019-01-21 DIAGNOSIS — Z87891 Personal history of nicotine dependence: Secondary | ICD-10-CM | POA: Diagnosis not present

## 2019-01-21 DIAGNOSIS — E559 Vitamin D deficiency, unspecified: Secondary | ICD-10-CM

## 2019-01-21 DIAGNOSIS — C946 Myelodysplastic disease, not classified: Secondary | ICD-10-CM | POA: Diagnosis not present

## 2019-01-21 DIAGNOSIS — D471 Chronic myeloproliferative disease: Secondary | ICD-10-CM | POA: Diagnosis not present

## 2019-01-21 DIAGNOSIS — R05 Cough: Secondary | ICD-10-CM | POA: Diagnosis not present

## 2019-01-21 DIAGNOSIS — R0609 Other forms of dyspnea: Secondary | ICD-10-CM | POA: Diagnosis not present

## 2019-01-21 MED ORDER — DOCUSATE SODIUM 100 MG PO CAPS: 100.0000 mg | ORAL_CAPSULE | Freq: Two times a day (BID) | ORAL | 0 refills | Status: AC

## 2019-01-21 MED ORDER — VITAMIN B 12 500 MCG PO TABS: 2500.0000 ug | ORAL_TABLET | Freq: Every day | ORAL | 4 refills | Status: AC

## 2019-01-21 MED ORDER — ERGOCALCIFEROL 1.25 MG (50000 UT) PO CAPS: 50000.0000 [IU] | ORAL_CAPSULE | ORAL | 4 refills | Status: AC

## 2019-01-21 NOTE — Assessment & Plan Note (Addendum)
1.  Jak 2 V6 62F essential thrombocytosis: - Patient is high risk for thrombus given that she has had a previous stroke.  She is also over the age of 87.  She is not on anticoagulation due to her weakness in regards for fall risk.  However she is on aspirin 81 mg p.o. daily. -Patient is on Hydrea 500 mg twice daily. -She denies any leg ulcers or pruritis. -Labs on 01/18/2019 showed hemoglobin 12.3, hematocrit 39.5, platelets 418, WBC 7.7 -Counts have remained stable with the Hydrea.  She will continue taking Hydrea 5 mg twice daily. -Goal is platelet count of less than or equal to 450. - She will follow-up in 4 months with repeat labs.  2.  New cough with blood-tinged sputum: -Patient reports she smoked from the age of 26 till she was 2. -Patient had a CT of her chest on 01/18/2019 which showed no acute process or explanation for hemoptysis.  Trace bilateral pleural fluid.  Emphysema.  Probable subpleural lymph node along the right minor fissure.  CT follow-up in 1 year.

## 2019-01-21 NOTE — Progress Notes (Signed)
[place], [place] [address]   CLINIC:  Medical Oncology/Hematology  PCP:  Terald Sleeper, PA-C [place] [address] [phone]   REASON FOR VISIT: Follow-up for Jak 2+ essential thrombocytosis  CURRENT THERAPY: Hydrea 500 mg twice daily   INTERVAL HISTORY:  Ms. Smikle 78 y.o. female returns for routine follow-up for JAK2 positive essential thrombocytosis.  Patient reports she has been doing well since her last visit.  She does report she is not very active or mobile.  Therefore she does not have any energy throughout the day.  She does report she is short of breath with exertion.  She denies any bright red bleeding per rectum or melena.  She denies any ulcers on her legs. Denies any nausea, vomiting, or diarrhea. Denies any new pains. Had not noticed any recent bleeding such as epistaxis, hematuria or hematochezia. Denies recent chest pain on exertion, shortness of breath on minimal exertion, pre-syncopal episodes, or palpitations. Denies any numbness or tingling in hands or feet. Denies any recent fevers, infections, or recent hospitalizations. Patient reports appetite at 100% and energy level at 0%.  She is eating well maintaining her weight at this time.    REVIEW OF SYSTEMS:  Review of Systems  Constitutional: Positive for fatigue.  Respiratory: Positive for shortness of breath.   Cardiovascular: Positive for leg swelling.  Gastrointestinal: Positive for constipation and diarrhea.  Neurological: Positive for dizziness.  All other systems reviewed and are negative.    PAST MEDICAL/SURGICAL HISTORY:  Past Medical History:  Diagnosis Date  . Anxiety   . Arthritis   . Cancer [facility])    Skin cancer  . Cataracts, both eyes 05/2014  . COPD (chronic obstructive pulmonary disease) (HCC)    intermittent home O2 - rarely uses  . Depression   . Diabetes mellitus without complication ([place])   . GERD (gastroesophageal reflux  disease)   . Hyperkalemia   . Hyperlipidemia   . Hypertension   . Seizures ([place])    From RH Negative blood when pregnant  . Stroke ([place])   . Thrombocytosis ([place])   . Thyroid disease    Past Surgical History:  Procedure Laterality Date  . ABDOMINAL HYSTERECTOMY    . ANKLE SURGERY Right   . BREAST SURGERY     Bx, none malignant  . COLONOSCOPY  2013   normal per patient  . COLONOSCOPY N/A 05/14/2016   Procedure: COLONOSCOPY;  Surgeon: Danie Binder, MD;  Location: [facility];  Service: Endoscopy;  Laterality: N/A;  8:30AM  . ESOPHAGOGASTRODUODENOSCOPY N/A 01/14/2016   Dr. Oneida Alar: normal esophagus, single hyperplastic gastric polyp, normal duodenum, negative H.pylori  . ROTATOR CUFF REPAIR Right   . Skin Cancers    . TONSILLECTOMY    . TONSILLECTOMY       SOCIAL HISTORY:  Social History   Socioeconomic History  . Marital status: Married    Spouse name: Carleene Overlie  . Number of children: 1  . Years of education: 2  . Highest education level: Associate degree: academic program  Occupational History  . Occupation: retired  Scientific laboratory technician  . Financial resource strain: Not hard at all  . Food insecurity    Worry: Never true    Inability: Never true  . Transportation needs    Medical: No    Non-medical: No  Tobacco Use  . Smoking status: Former Smoker    Packs/day: 3.00    Years: 36.00  Pack years: 108.00    Types: Cigarettes    Quit date: 03/10/2000    Years since quitting: 18.8  . Smokeless tobacco: Never Used  Substance and Sexual Activity  . Alcohol use: No  . Drug use: No  . Sexual activity: Not Currently  Lifestyle  . Physical activity    Days per week: 0 days    Minutes per session: 0 min  . Stress: Not at all  Relationships  . Social connections    Talks on phone: More than three times a week    Gets together: More than three times a week    Attends religious service: Never    Active member of club or organization: No    Attends meetings of clubs or  organizations: Never    Relationship status: Married  . Intimate partner violence    Fear of current or ex partner: No    Emotionally abused: No    Physically abused: No    Forced sexual activity: No  Other Topics Concern  . Not on file  Social History Narrative  . Not on file    FAMILY HISTORY:  Family History  Problem Relation Age of Onset  . Alzheimer's disease Mother   . Arthritis Father   . Stroke Maternal Aunt     CURRENT MEDICATIONS:  Outpatient Encounter Medications as of 01/21/2019  Medication Sig  . aspirin EC 81 MG tablet Take 81 mg by mouth daily.  Marland Kitchen azithromycin (ZITHROMAX Z-PAK) 250 MG tablet Take as directed  . gabapentin (NEURONTIN) 300 MG capsule TAKE 1 CAPSULE BY MOUTH EVERYDAY AT BEDTIME  . guaiFENesin (MUCINEX) 600 MG 12 hr tablet Take 1 tablet (600 mg total) by mouth 2 (two) times daily.  . hydroxyurea (HYDREA) 500 MG capsule TAKE 1 CAPSULE TWICE A DAY WITH FOOD TO MINIMIZE GI SIDE EFFECTS  . levothyroxine (SYNTHROID) 112 MCG tablet TAKE 1 TABLET DAILY  . metFORMIN (GLUCOPHAGE) 500 MG tablet Take 1 tablet (500 mg total) by mouth 2 (two) times daily with a meal.  . metoprolol succinate (TOPROL-XL) 25 MG 24 hr tablet Take 1 tablet (25 mg total) by mouth daily.  . Multiple Vitamins-Minerals (ICAPS AREDS 2 PO) Take 1 capsule by mouth 2 (two) times daily.  . OXYGEN Inhale 2 L into the lungs.  Marland Kitchen PARoxetine (PAXIL) 40 MG tablet TAKE 1 TABLET DAILY  . traZODone (DESYREL) 100 MG tablet TAKE 1/2 TO 1 TABLET AT BEDTIME (TAKES 1 TABLET)  . TRELEGY ELLIPTA 100-62.5-25 MCG/INH AEPB Inhale 1 puff into the lungs daily.  Marland Kitchen docusate sodium (COLACE) 100 MG capsule Take 1 capsule (100 mg total) by mouth 2 (two) times daily.  . ergocalciferol (VITAMIN D2) 1.25 MG (50000 UT) capsule Take 1 capsule (50,000 Units total) by mouth once a week.  . fexofenadine-pseudoephedrine (ALLEGRA-D 24) 180-240 MG 24 hr tablet Take 1 tablet by mouth as needed (allergies).   . LORazepam (ATIVAN)  1 MG tablet Take 1 tablet (1 mg total) by mouth 2 (two) times daily as needed for anxiety. (Patient not taking: Reported on 01/21/2019)   No facility-administered encounter medications on file as of 01/21/2019.     ALLERGIES:  Allergies  Allergen Reactions  . Bee Venom Anaphylaxis  . Codeine Itching  . Lipitor [Atorvastatin]     Extreme Dry Mouth, joint pain  . Penicillins Hives    Has patient had a PCN reaction causing immediate rash, facial/tongue/throat swelling, SOB or lightheadedness with hypotension: No Has patient had a PCN reaction  causing severe rash involving mucus membranes or skin necrosis: Yes Has patient had a PCN reaction that required hospitalization: No Has patient had a PCN reaction occurring within the last 10 years: No If all of the above answers are "NO", then may proceed with Cephalosporin use.   . Demeclocycline Rash  . Morphine And Related Rash  . Tetracyclines & Related Rash     PHYSICAL EXAM:  ECOG Performance status: 1  Vitals:   01/21/19 1350  BP: 98/67  Pulse: (!) 106  Resp: (!) 21  Temp: 97.8 F (36.6 C)  SpO2: 98%   Filed Weights   01/21/19 1350  Weight: 158 lb (71.7 kg)    Physical Exam Constitutional:      Appearance: Normal appearance. She is normal weight.  Cardiovascular:     Rate and Rhythm: Normal rate and regular rhythm.     Heart sounds: Normal heart sounds.  Pulmonary:     Effort: Pulmonary effort is normal.     Breath sounds: Normal breath sounds.  Abdominal:     General: Bowel sounds are normal.     Palpations: Abdomen is soft.  Musculoskeletal: Normal range of motion.  Skin:    General: Skin is warm.  Neurological:     Mental Status: She is alert and oriented to person, place, and time. Mental status is at baseline.  Psychiatric:        Mood and Affect: Mood normal.        Behavior: Behavior normal.        Thought Content: Thought content normal.        Judgment: Judgment normal.      LABORATORY DATA:  I  have reviewed the labs as listed.  CBC    Component Value Date/Time   WBC 7.7 01/18/2019 1308   RBC 3.35 (L) 01/18/2019 1308   HGB 12.3 01/18/2019 1308   HGB 14.8 11/22/2015 1123   HCT 39.5 01/18/2019 1308   HCT 46.7 (H) 11/22/2015 1123   PLT 418 (H) 01/18/2019 1308   PLT 598 (H) 11/22/2015 1123   MCV 117.9 (H) 01/18/2019 1308   MCV 73 (L) 11/22/2015 1123   MCH 36.7 (H) 01/18/2019 1308   MCHC 31.1 01/18/2019 1308   RDW 16.1 (H) 01/18/2019 1308   RDW 18.7 (H) 11/22/2015 1123   LYMPHSABS 1.3 01/18/2019 1308   LYMPHSABS 1.6 11/22/2015 1123   MONOABS 0.8 01/18/2019 1308   EOSABS 0.2 01/18/2019 1308   EOSABS 0.5 (H) 11/22/2015 1123   BASOSABS 0.1 01/18/2019 1308   BASOSABS 0.1 11/22/2015 1123   CMP Latest Ref Rng & Units 01/18/2019 09/15/2018 02/12/2018  Glucose 70 - 99 mg/dL 108(H) 105(H) 107(H)  BUN 8 - 23 mg/dL 16 17 16   Creatinine 0.44 - 1.00 mg/dL 1.11(H) 0.73 0.70  Sodium 135 - 145 mmol/L 136 137 139  Potassium 3.5 - 5.1 mmol/L 4.6 4.7 4.0  Chloride 98 - 111 mmol/L 102 102 104  CO2 22 - 32 mmol/L 23 28 26   Calcium 8.9 - 10.3 mg/dL 8.7(L) 8.3(L) 9.0  Total Protein 6.5 - 8.1 g/dL 6.5 6.2(L) 6.9  Total Bilirubin 0.3 - 1.2 mg/dL 0.9 0.7 0.8  Alkaline Phos 38 - 126 U/L 65 60 57  AST 15 - 41 U/L 15 12(L) 17  ALT 0 - 44 U/L 9 10 10        DIAGNOSTIC IMAGING:  I have independently reviewed the scans and discussed with the patient.   I have reviewed Francene Finders, NP's note and  agree with the documentation.  I personally performed a face-to-face visit, made revisions and my assessment and plan is as follows.    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  Jak 2 V6 60F essential thrombocytosis: - Patient is high risk for thrombus given that she has had a previous stroke.  She is also over the age of 65.  She is not on anticoagulation due to her weakness in regards for fall risk.  However she is on aspirin 81 mg p.o. daily. -Patient is on Hydrea 500 mg twice  daily. -She denies any leg ulcers or pruritis. -Labs on 01/18/2019 showed hemoglobin 12.3, hematocrit 39.5, platelets 418, WBC 7.7 -Counts have remained stable with the Hydrea.  She will continue taking Hydrea 5 mg twice daily. -Goal is platelet count of less than or equal to 450. - She will follow-up in 4 months with repeat labs.  2.  New cough with blood-tinged sputum: -Patient reports she smoked from the age of 28 till she was 28. -Patient had a CT of her chest on 01/18/2019 which showed no acute process or explanation for hemoptysis.  Trace bilateral pleural fluid.  Emphysema.  Probable subpleural lymph node along the right minor fissure.  CT follow-up in 1 year.       Orders placed this encounter:  Orders Placed This Encounter  Procedures  . CBC with Differential/Platelet  . Comprehensive metabolic panel  . Ferritin  . Iron and TIBC  . Lactate dehydrogenase  . Vitamin D 25 hydroxy  . Vitamin B12  . [place] FNP-C [place] [phone]

## 2019-02-15 ENCOUNTER — Other Ambulatory Visit: Payer: Self-pay | Admitting: Physician Assistant

## 2019-02-15 DIAGNOSIS — E119 Type 2 diabetes mellitus without complications: Secondary | ICD-10-CM

## 2019-02-15 DIAGNOSIS — E039 Hypothyroidism, unspecified: Secondary | ICD-10-CM

## 2019-02-15 NOTE — Progress Notes (Signed)
Tried to call patient no answer. 

## 2019-02-15 NOTE — Telephone Encounter (Signed)
I do not see a current hgba1c

## 2019-02-28 ENCOUNTER — Other Ambulatory Visit: Payer: Self-pay | Admitting: Physician Assistant

## 2019-02-28 DIAGNOSIS — D471 Chronic myeloproliferative disease: Secondary | ICD-10-CM

## 2019-03-30 ENCOUNTER — Telehealth: Payer: Self-pay | Admitting: Physician Assistant

## 2019-03-30 ENCOUNTER — Other Ambulatory Visit: Payer: Self-pay | Admitting: Physician Assistant

## 2019-03-30 MED ORDER — PAROXETINE HCL 20 MG PO TABS: 20.0000 mg | ORAL_TABLET | Freq: Every day | ORAL | 2 refills | Status: DC

## 2019-03-30 NOTE — Progress Notes (Signed)
Due to her age Paxil is considered to be sedating and can cause confusion.  I am going to lower her Paxil from 40 mg to 20 mg over the next month.  If she can have a phone visit to discuss it that would be great.  And we can always change to a different 1.  I have sent in a new prescription for Paxil 20 mg to her pharmacy.  Terald Sleeper PA-C [place] [facility] [address]  [place], [place] [address] [phone]

## 2019-03-30 NOTE — Telephone Encounter (Signed)
Jordan [name] at the pharmacy it was not an error and the 20 mg is accurate.

## 2019-03-30 NOTE — Progress Notes (Signed)
TTC pt but no answer, couldn't leave message.

## 2019-03-30 NOTE — Telephone Encounter (Signed)
Marcie Bal from [facility] called stating that patient has been taking the generic brand of Paxil, 40mg  per day in the past, but says the Rx we just sent over is for 20mg . Wants to confirm whether the dosage did in fact change or if this was sent for 20mg  in error.

## 2019-04-01 ENCOUNTER — Telehealth: Payer: Self-pay | Admitting: Physician Assistant

## 2019-04-01 ENCOUNTER — Other Ambulatory Visit: Payer: Self-pay | Admitting: Physician Assistant

## 2019-04-01 MED ORDER — PAROXETINE HCL 40 MG PO TABS: 20.0000 mg | ORAL_TABLET | Freq: Every day | ORAL | 5 refills | Status: DC

## 2019-04-01 NOTE — Telephone Encounter (Signed)
Noted, script for Paxil 40 is sent to the pharmacy

## 2019-04-01 NOTE — Telephone Encounter (Signed)
Do you mind calling and getting the information?  I received a letter from her insurance stating that Paxil in geriatric patients can be high risk.  Therefore I lowered her medication.  I did put a message on the pharmacy note for them to let her know about this was being reduced.  Obviously this information was not relayed to her or her family.  I am not sure what else the daughter needs or if there is something else going on with Jordan [name]

## 2019-04-01 NOTE — Telephone Encounter (Signed)
Patient previously on paxil 40mg  and rx for paxil 20 mg was sent to pharmacy.  Daughter would like to know why this medication dosage was changed

## 2019-04-01 NOTE — Telephone Encounter (Signed)
Patients family does not feel that decreasing her paxil dose is a wise choice. Due to her still having times of depression even with the medication. They would like her dose increased back.

## 2019-04-01 NOTE — Telephone Encounter (Signed)
I put in the pharmacy note for them to let them know we are decreasing it because of her age and risk of being on higher Paxil doses.  It is recommended that she be on a lower dose related to age, possible confusion.

## 2019-04-04 NOTE — Progress Notes (Signed)
Multiple encounters open, left message with patient's daughter.  This encounter will now be closed.

## 2019-04-05 ENCOUNTER — Telehealth: Payer: Self-pay | Admitting: Physician Assistant

## 2019-04-05 MED ORDER — PAROXETINE HCL 40 MG PO TABS: 40.0000 mg | ORAL_TABLET | ORAL | 5 refills | Status: AC

## 2019-04-05 NOTE — Telephone Encounter (Signed)
Patient daughter aware that rx has been corrected.

## 2019-04-08 ENCOUNTER — Telehealth: Payer: Self-pay | Admitting: Physician Assistant

## 2019-04-08 NOTE — Telephone Encounter (Signed)
Rx written and placed on providers desk for signature

## 2019-04-08 NOTE — Telephone Encounter (Signed)
Patient daughter spoke with [employer] and is needing the following items raised toilet seat, reacher, and shower bench. Charmian Muff [phone].

## 2019-04-13 NOTE — Telephone Encounter (Signed)
Rx faxed

## 2019-04-19 ENCOUNTER — Other Ambulatory Visit: Payer: Self-pay | Admitting: Physician Assistant

## 2019-04-21 ENCOUNTER — Telehealth: Payer: Self-pay | Admitting: Physician Assistant

## 2019-04-21 NOTE — Chronic Care Management (AMB) (Signed)
  Chronic Care Management   Outreach Note  04/21/2019 Name: Jordan [name] MRN: [id number] DOB: 1940-09-25  Jordan [name] is a 79 y.o. year old female who is a primary care patient of Terald Sleeper, PA-C. I reached out to [name] by phone today in response to a referral sent by [name] health plan.     An unsuccessful telephone outreach was attempted today. The patient was referred to the case management team for assistance with care management and care coordination.   Follow Up Plan: A HIPPA compliant phone message was left for the patient providing contact information and requesting a return call.  The care management team will reach out to the patient again over the next 7 days.  If patient returns call to provider office, please advise to call [facility] at [place], [place], [place], [place] [address] Direct Dial: [phone] [email or web address] Website: [email or web address]

## 2019-04-25 NOTE — Chronic Care Management (AMB) (Signed)
  Chronic Care Management   Outreach Note  04/25/2019 Name: Jordan [name] MRN: [id number] DOB: 07-03-1940  Jordan [name] is a 79 y.o. year old female who is a primary care patient of Terald Sleeper, PA-C. I reached out to [name] by phone today in response to a referral sent by [name] health plan.     A second unsuccessful telephone outreach was attempted today. The patient was referred to the case management team for assistance with care management and care coordination.   Follow Up Plan: A HIPPA compliant phone message was left for the patient providing contact information and requesting a return call.  The care management team will reach out to the patient again over the next 7 days.  If patient returns call to provider office, please advise to call [facility] at [place], [place], [place], [place] [address] Direct Dial: [phone] [email or web address] Website: [email or web address]

## 2019-04-27 NOTE — Chronic Care Management (AMB) (Signed)
  Chronic Care Management   Outreach Note  04/27/2019 Name: Jordan [name] MRN: [id number] DOB: June 24, 1940  Jordan [name] is a 79 y.o. year old female who is a primary care patient of Terald Sleeper, PA-C. I reached out to [name] by phone today in response to a referral sent by [name] health plan.     Third unsuccessful telephone outreach was attempted today. The patient was referred to the case management team for assistance with care management and care coordination. The patient's primary care provider has been notified of our unsuccessful attempts to make or maintain contact with the patient. The care management team is pleased to engage with this patient at any time in the future should he/she be interested in assistance from the care management team.   Follow Up Plan: A HIPPA compliant phone message was left for the patient providing contact information and requesting a return call.  The care management team is available to follow up with the patient after provider conversation with the patient regarding recommendation for care management engagement and subsequent re-referral to the care management team.  If patient returns call to provider office, please advise to call [facility] at [place], [place], [place], [place] [address] Direct Dial: [phone] [email or web address]: [email or web address]

## 2019-04-28 ENCOUNTER — Other Ambulatory Visit: Payer: Self-pay | Admitting: Physician Assistant

## 2019-05-13 ENCOUNTER — Inpatient Hospital Stay (HOSPITAL_COMMUNITY): Payer: Medicare Other | Attending: Hematology

## 2019-05-13 ENCOUNTER — Other Ambulatory Visit: Payer: Self-pay

## 2019-05-13 DIAGNOSIS — D473 Essential (hemorrhagic) thrombocythemia: Secondary | ICD-10-CM | POA: Diagnosis not present

## 2019-05-13 DIAGNOSIS — Z8673 Personal history of transient ischemic attack (TIA), and cerebral infarction without residual deficits: Secondary | ICD-10-CM | POA: Diagnosis not present

## 2019-05-13 DIAGNOSIS — D471 Chronic myeloproliferative disease: Secondary | ICD-10-CM

## 2019-05-13 LAB — CBC WITH DIFFERENTIAL/PLATELET
Abs Immature Granulocytes: 0.01 10*3/uL (ref 0.00–0.07)
Basophils Absolute: 0.1 10*3/uL (ref 0.0–0.1)
Basophils Relative: 1 %
Eosinophils Absolute: 0.1 10*3/uL (ref 0.0–0.5)
Eosinophils Relative: 2 %
HCT: 41.8 % (ref 36.0–46.0)
Hemoglobin: 13.4 g/dL (ref 12.0–15.0)
Immature Granulocytes: 0 %
Lymphocytes Relative: 12 %
Lymphs Abs: 0.8 10*3/uL (ref 0.7–4.0)
MCH: 38.7 pg — ABNORMAL HIGH (ref 26.0–34.0)
MCHC: 32.1 g/dL (ref 30.0–36.0)
MCV: 120.8 fL — ABNORMAL HIGH (ref 80.0–100.0)
Monocytes Absolute: 0.8 10*3/uL (ref 0.1–1.0)
Monocytes Relative: 11 %
Neutro Abs: 5.1 10*3/uL (ref 1.7–7.7)
Neutrophils Relative %: 74 %
Platelets: 392 10*3/uL (ref 150–400)
RBC: 3.46 MIL/uL — ABNORMAL LOW (ref 3.87–5.11)
RDW: 13.7 % (ref 11.5–15.5)
WBC: 6.9 10*3/uL (ref 4.0–10.5)
nRBC: 0 % (ref 0.0–0.2)

## 2019-05-13 LAB — COMPREHENSIVE METABOLIC PANEL
ALT: 10 U/L (ref 0–44)
AST: 17 U/L (ref 15–41)
Albumin: 4 g/dL (ref 3.5–5.0)
Alkaline Phosphatase: 70 U/L (ref 38–126)
Anion gap: 11 (ref 5–15)
BUN: 13 mg/dL (ref 8–23)
CO2: 23 mmol/L (ref 22–32)
Calcium: 9.1 mg/dL (ref 8.9–10.3)
Chloride: 105 mmol/L (ref 98–111)
Creatinine, Ser: 1.04 mg/dL — ABNORMAL HIGH (ref 0.44–1.00)
GFR calc Af Amer: 59 mL/min — ABNORMAL LOW (ref >60)
GFR calc non Af Amer: 51 mL/min — ABNORMAL LOW (ref >60)
Glucose, Bld: 124 mg/dL — ABNORMAL HIGH (ref 70–99)
Potassium: 4.1 mmol/L (ref 3.5–5.1)
Sodium: 139 mmol/L (ref 135–145)
Total Bilirubin: 0.9 mg/dL (ref 0.3–1.2)
Total Protein: 6.8 g/dL (ref 6.5–8.1)

## 2019-05-13 LAB — VITAMIN B12: Vitamin B-12: 3835 pg/mL — ABNORMAL HIGH (ref 180–914)

## 2019-05-13 LAB — IRON AND TIBC
Iron: 88 ug/dL (ref 28–170)
Saturation Ratios: 23 % (ref 10.4–31.8)
TIBC: 377 ug/dL (ref 250–450)
UIBC: 289 ug/dL

## 2019-05-13 LAB — FOLATE: Folate: 7.2 ng/mL (ref >5.9)

## 2019-05-13 LAB — VITAMIN D 25 HYDROXY (VIT D DEFICIENCY, FRACTURES): Vit D, 25-Hydroxy: 75.04 ng/mL (ref 30–100)

## 2019-05-13 LAB — LACTATE DEHYDROGENASE: LDH: 207 U/L — ABNORMAL HIGH (ref 98–192)

## 2019-05-13 LAB — FERRITIN: Ferritin: 24 ng/mL (ref 11–307)

## 2019-05-19 ENCOUNTER — Telehealth: Payer: Self-pay | Admitting: Physician Assistant

## 2019-05-19 ENCOUNTER — Other Ambulatory Visit: Payer: Self-pay | Admitting: Physician Assistant

## 2019-05-20 ENCOUNTER — Other Ambulatory Visit (HOSPITAL_COMMUNITY): Payer: Self-pay | Admitting: Nurse Practitioner

## 2019-05-20 ENCOUNTER — Other Ambulatory Visit: Payer: Self-pay

## 2019-05-20 ENCOUNTER — Inpatient Hospital Stay (HOSPITAL_BASED_OUTPATIENT_CLINIC_OR_DEPARTMENT_OTHER): Payer: Medicare Other | Admitting: Nurse Practitioner

## 2019-05-20 DIAGNOSIS — E1169 Type 2 diabetes mellitus with other specified complication: Secondary | ICD-10-CM

## 2019-05-20 DIAGNOSIS — D471 Chronic myeloproliferative disease: Secondary | ICD-10-CM | POA: Diagnosis present

## 2019-05-20 MED ORDER — METFORMIN HCL 500 MG PO TABS: 500.0000 mg | ORAL_TABLET | Freq: Two times a day (BID) | ORAL | 2 refills | Status: DC

## 2019-05-20 NOTE — Patient Instructions (Signed)
[facility] Discharge Instructions  Follow up in 4 months with labs    Thank you for choosing [facility] at [facility] to provide your oncology and hematology care.  To afford each patient quality time with our provider, please arrive at least 15 minutes before your scheduled appointment time.   If you have a lab appointment with the Cancer Center please come in thru the Main Entrance and check in at the main information desk.  You need to re-schedule your appointment should you arrive 10 or more minutes late.  We strive to give you quality time with our providers, and arriving late affects you and other patients whose appointments are after yours.  Also, if you no show three or more times for appointments you may be dismissed from the clinic at the providers discretion.     Again, thank you for choosing [facility].  Our hope is that these requests will decrease the amount of time that you wait before being seen by our physicians.       _____________________________________________________________  Should you have questions after your visit to [facility], please contact our office at [phone] between the hours of 8:00 a.m. and 4:30 p.m.  Voicemails left after 4:00 p.m. will not be returned until the following business day.  For prescription refill requests, have your pharmacy contact our office and allow 72 hours.    Due to Covid, you will need to wear a mask upon entering the hospital. If you do not have a mask, a mask will be given to you at the Main Entrance upon arrival. For doctor visits, patients may have 1 support person with them. For treatment visits, patients can not have anyone with them due to social distancing guidelines and our immunocompromised population.      

## 2019-05-20 NOTE — Progress Notes (Signed)
[place], [place] [address]   CLINIC:  Medical Oncology/Hematology  PCP:  Terald Sleeper, PA-C [place] [address] [phone]   REASON FOR VISIT: Follow-up for essential thrombocytosis  CURRENT THERAPY: hydrea 500mg  twice daily    INTERVAL HISTORY:  Jordan [name] 79 y.o. female was called for a telephone interview today for essential thrombocytosis.  Patient reports she is doing well since her last visit.  She is taking her medication as prescribed.  She denies any leg ulcers.  She denies any aquagenic pruritus.  She denies any hot flashes. Denies any nausea, vomiting, or diarrhea. Denies any new pains. Had not noticed any recent bleeding such as epistaxis, hematuria or hematochezia. Denies recent chest pain on exertion, shortness of breath on minimal exertion, pre-syncopal episodes, or palpitations. Denies any numbness or tingling in hands or feet. Denies any recent fevers, infections, or recent hospitalizations. Patient reports appetite at 100% and energy level at 75%.  She is eating well maintain her weight at this time.     REVIEW OF SYSTEMS:  Review of Systems  All other systems reviewed and are negative.    PAST MEDICAL/SURGICAL HISTORY:  Past Medical History:  Diagnosis Date  . Anxiety   . Arthritis   . Cancer [facility])    Skin cancer  . Cataracts, both eyes 05/2014  . COPD (chronic obstructive pulmonary disease) (HCC)    intermittent home O2 - rarely uses  . Depression   . Diabetes mellitus without complication ([place])   . GERD (gastroesophageal reflux disease)   . Hyperkalemia   . Hyperlipidemia   . Hypertension   . Seizures ([place])    From RH Negative blood when pregnant  . Stroke ([place])   . Thrombocytosis ([place])   . Thyroid disease    Past Surgical History:  Procedure Laterality Date  . ABDOMINAL HYSTERECTOMY    . ANKLE SURGERY Right   . BREAST SURGERY     Bx, none malignant  . COLONOSCOPY  2013   normal  per patient  . COLONOSCOPY N/A 05/14/2016   Procedure: COLONOSCOPY;  Surgeon: Danie Binder, MD;  Location: [facility];  Service: Endoscopy;  Laterality: N/A;  8:30AM  . ESOPHAGOGASTRODUODENOSCOPY N/A 01/14/2016   Dr. Oneida Alar: normal esophagus, single hyperplastic gastric polyp, normal duodenum, negative H.pylori  . ROTATOR CUFF REPAIR Right   . Skin Cancers    . TONSILLECTOMY    . TONSILLECTOMY       SOCIAL HISTORY:  Social History   Socioeconomic History  . Marital status: Married    Spouse name: Carleene Overlie  . Number of children: 1  . Years of education: 15  . Highest education level: Associate degree: academic program  Occupational History  . Occupation: retired  Tobacco Use  . Smoking status: Former Smoker    Packs/day: 3.00    Years: 36.00    Pack years: 108.00    Types: Cigarettes    Quit date: 03/10/2000    Years since quitting: 19.2  . Smokeless tobacco: Never Used  Substance and Sexual Activity  . Alcohol use: No  . Drug use: No  . Sexual activity: Not Currently  Other Topics Concern  . Not on file  Social History Narrative  . Not on file   Social Determinants of Health   Financial Resource Strain: Low Risk   . Difficulty of Paying Living Expenses: Not hard at all  Food Insecurity: No Food Insecurity  .  Worried About Charity fundraiser in the Last Year: Never true  . Ran Out of Food in the Last Year: Never true  Transportation Needs: No Transportation Needs  . Lack of Transportation (Medical): No  . Lack of Transportation (Non-Medical): No  Physical Activity: Inactive  . Days of Exercise per Week: 0 days  . Minutes of Exercise per Session: 0 min  Stress: No Stress Concern Present  . Feeling of Stress : Not at all  Social Connections: Somewhat Isolated  . Frequency of Communication with Friends and Family: More than three times a week  . Frequency of Social Gatherings with Friends and Family: More than three times a week  . Attends Religious Services:  Never  . Active Member of Clubs or Organizations: No  . Attends Archivist Meetings: Never  . Marital Status: Married  Human resources officer Violence: Not At Risk  . Fear of Current or Ex-Partner: No  . Emotionally Abused: No  . Physically Abused: No  . Sexually Abused: No    FAMILY HISTORY:  Family History  Problem Relation Age of Onset  . Alzheimer's disease Mother   . Arthritis Father   . Stroke Maternal Aunt     CURRENT MEDICATIONS:  Outpatient Encounter Medications as of 05/20/2019  Medication Sig  . aspirin EC 81 MG tablet Take 81 mg by mouth daily.  Marland Kitchen azithromycin (ZITHROMAX Z-PAK) 250 MG tablet Take as directed  . Cyanocobalamin (VITAMIN B 12) 500 MCG TABS Take 2,500 mcg by mouth daily.  Marland Kitchen docusate sodium (COLACE) 100 MG capsule Take 1 capsule (100 mg total) by mouth 2 (two) times daily.  . ergocalciferol (VITAMIN D2) 1.25 MG (50000 UT) capsule Take 1 capsule (50,000 Units total) by mouth once a week.  . fexofenadine-pseudoephedrine (ALLEGRA-D 24) 180-240 MG 24 hr tablet Take 1 tablet by mouth as needed (allergies).   . gabapentin (NEURONTIN) 300 MG capsule TAKE 1 CAPSULE BY MOUTH EVERYDAY AT BEDTIME  . guaiFENesin (MUCINEX) 600 MG 12 hr tablet Take 1 tablet (600 mg total) by mouth 2 (two) times daily.  . hydroxyurea (HYDREA) 500 MG capsule TAKE 1 CAPSULE TWICE A DAY WITH FOOD TO MINIMIZE GI SIDE EFFECTS  . levothyroxine (SYNTHROID) 112 MCG tablet Take 1 tablet (112 mcg total) by mouth daily. (Needs to be seen before next refill)  . LORazepam (ATIVAN) 1 MG tablet Take 1 tablet (1 mg total) by mouth 2 (two) times daily as needed for anxiety. (Patient not taking: Reported on 01/21/2019)  . metFORMIN (GLUCOPHAGE) 500 MG tablet Take 1 tablet (500 mg total) by mouth 2 (two) times daily with a meal. (Needs to be seen before next refill)  . metoprolol succinate (TOPROL-XL) 25 MG 24 hr tablet Take 1 tablet by mouth daily. Needs OV for further fills  . Multiple Vitamins-Minerals  (ICAPS AREDS 2 PO) Take 1 capsule by mouth 2 (two) times daily.  . OXYGEN Inhale 2 L into the lungs.  Marland Kitchen PARoxetine (PAXIL) 40 MG tablet Take 1 tablet (40 mg total) by mouth every morning.  . traZODone (DESYREL) 100 MG tablet TAKE 1/2 TO 1 TABLET AT BEDTIME (TAKES 1 TABLET)  . TRELEGY ELLIPTA 100-62.5-25 MCG/INH AEPB Inhale 1 puff into the lungs daily.   No facility-administered encounter medications on file as of 05/20/2019.    ALLERGIES:  Allergies  Allergen Reactions  . Bee Venom Anaphylaxis  . Codeine Itching  . Lipitor [Atorvastatin]     Extreme Dry Mouth, joint pain  . Penicillins Hives  Has patient had a PCN reaction causing immediate rash, facial/tongue/throat swelling, SOB or lightheadedness with hypotension: No Has patient had a PCN reaction causing severe rash involving mucus membranes or skin necrosis: Yes Has patient had a PCN reaction that required hospitalization: No Has patient had a PCN reaction occurring within the last 10 years: No If all of the above answers are "NO", then may proceed with Cephalosporin use.   . Demeclocycline Rash  . Morphine And Related Rash  . Tetracyclines & Related Rash    Vital signs: -Deferred due to telephone visit   Physical Exam -Deferred due to telephone visit -Patient was alert and oriented over the phone and in no acute distress.   LABORATORY DATA:  I have reviewed the labs as listed.  CBC    Component Value Date/Time   WBC 6.9 05/13/2019 1313   RBC 3.46 (L) 05/13/2019 1313   HGB 13.4 05/13/2019 1313   HGB 14.8 11/22/2015 1123   HCT 41.8 05/13/2019 1313   HCT 46.7 (H) 11/22/2015 1123   PLT 392 05/13/2019 1313   PLT 598 (H) 11/22/2015 1123   MCV 120.8 (H) 05/13/2019 1313   MCV 73 (L) 11/22/2015 1123   MCH 38.7 (H) 05/13/2019 1313   MCHC 32.1 05/13/2019 1313   RDW 13.7 05/13/2019 1313   RDW 18.7 (H) 11/22/2015 1123   LYMPHSABS 0.8 05/13/2019 1313   LYMPHSABS 1.6 11/22/2015 1123   MONOABS 0.8 05/13/2019 1313    EOSABS 0.1 05/13/2019 1313   EOSABS 0.5 (H) 11/22/2015 1123   BASOSABS 0.1 05/13/2019 1313   BASOSABS 0.1 11/22/2015 1123   CMP Latest Ref Rng & Units 05/13/2019 01/18/2019 09/15/2018  Glucose 70 - 99 mg/dL 124(H) 108(H) 105(H)  BUN 8 - 23 mg/dL 13 16 17   Creatinine 0.44 - 1.00 mg/dL 1.04(H) 1.11(H) 0.73  Sodium 135 - 145 mmol/L 139 136 137  Potassium 3.5 - 5.1 mmol/L 4.1 4.6 4.7  Chloride 98 - 111 mmol/L 105 102 102  CO2 22 - 32 mmol/L 23 23 28   Calcium 8.9 - 10.3 mg/dL 9.1 8.7(L) 8.3(L)  Total Protein 6.5 - 8.1 g/dL 6.8 6.5 6.2(L)  Total Bilirubin 0.3 - 1.2 mg/dL 0.9 0.9 0.7  Alkaline Phos 38 - 126 U/L 70 65 60  AST 15 - 41 U/L 17 15 12(L)  ALT 0 - 44 U/L 10 9 10      All questions were answered to patient's stated satisfaction. Encouraged patient to call with any new concerns or questions before his next visit to the cancer center and we can certain see him sooner, if needed.     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  Jak 2 V6 21F essential thrombocytosis: - Patient is high risk for thrombus given that she has had a previous stroke.  She is also over the age of 50.  She is not on anticoagulation due to her weakness in regards for fall risk.  However she is on aspirin 81 mg p.o. daily. -Patient is on Hydrea 500 mg twice daily. -She denies any leg ulcers or pruritis. -Labs on 05/13/2019 showed hemoglobin 13.4, hematocrit 41.8, platelets 392, WBC 6.9 -Counts have remained stable with the Hydrea.  She will continue taking Hydrea 500 mg twice daily. -Goal is platelet count of less than or equal to 450. - She will follow-up in 4 months with repeat labs.  2.  New cough with blood-tinged sputum: -Patient reports she smoked from the age of 30 till she was 49. -Patient  had a CT of her chest on 01/18/2019 which showed no acute process or explanation for hemoptysis.  Trace bilateral pleural fluid.  Emphysema.  Probable subpleural lymph node along the right minor fissure.  CT  follow-up in 1 year.       Orders placed this encounter:  Orders Placed This Encounter  Procedures  . Lactate dehydrogenase  . CBC with Differential/Platelet  . Comprehensive metabolic panel  . Vitamin B12  . VITAMIN D 25 Hydroxy (Vit-D Deficiency, Fractures)      Francene Finders, FNP-C [place] [phone]

## 2019-05-20 NOTE — Telephone Encounter (Signed)
Left message ,to please call our office to schedule televisit and confirm receiving this information.Refill was given.

## 2019-05-20 NOTE — Telephone Encounter (Signed)
The patient is on palliative care and does have a hard time getting around.  I have reached out to the oncologist and asked them to add an A1c to their order.  It was placed today as a future lab and so I went ahead and refilled her medications for now.  In the future it might be okay for [place] to do a video visit if possible with them

## 2019-05-20 NOTE — Assessment & Plan Note (Signed)
1.  Jak 2 V6 39F essential thrombocytosis: - Patient is high risk for thrombus given that she has had a previous stroke.  She is also over the age of 24.  She is not on anticoagulation due to her weakness in regards for fall risk.  However she is on aspirin 81 mg p.o. daily. -Patient is on Hydrea 500 mg twice daily. -She denies any leg ulcers or pruritis. -Labs on 05/13/2019 showed hemoglobin 13.4, hematocrit 41.8, platelets 392, WBC 6.9 -Counts have remained stable with the Hydrea.  She will continue taking Hydrea 500 mg twice daily. -Goal is platelet count of less than or equal to 450. - She will follow-up in 4 months with repeat labs.  2.  New cough with blood-tinged sputum: -Patient reports she smoked from the age of 46 till she was 35. -Patient had a CT of her chest on 01/18/2019 which showed no acute process or explanation for hemoptysis.  Trace bilateral pleural fluid.  Emphysema.  Probable subpleural lymph node along the right minor fissure.  CT follow-up in 1 year.

## 2019-05-20 NOTE — Telephone Encounter (Signed)
Aware. 

## 2019-05-20 NOTE — Telephone Encounter (Signed)
Please advise. No A1C on file for years?  Refill request.

## 2019-05-26 ENCOUNTER — Other Ambulatory Visit: Payer: Self-pay | Admitting: Physician Assistant

## 2019-05-27 ENCOUNTER — Inpatient Hospital Stay (HOSPITAL_COMMUNITY): Payer: Medicare Other | Admitting: Nurse Practitioner

## 2019-05-27 ENCOUNTER — Other Ambulatory Visit: Payer: Self-pay

## 2019-06-01 ENCOUNTER — Other Ambulatory Visit: Payer: Self-pay

## 2019-06-01 ENCOUNTER — Inpatient Hospital Stay (HOSPITAL_BASED_OUTPATIENT_CLINIC_OR_DEPARTMENT_OTHER): Payer: Medicare Other | Admitting: Nurse Practitioner

## 2019-06-01 ENCOUNTER — Ambulatory Visit: Payer: Medicare Other | Admitting: Physician Assistant

## 2019-06-01 DIAGNOSIS — D471 Chronic myeloproliferative disease: Secondary | ICD-10-CM

## 2019-06-01 NOTE — Patient Instructions (Signed)
[facility] Discharge Instructions  Follow up in 3 months with lab s   Thank you for choosing [facility] at [facility] to provide your oncology and hematology care.  To afford each patient quality time with our provider, please arrive at least 15 minutes before your scheduled appointment time.   If you have a lab appointment with the Cancer Center please come in thru the Main Entrance and check in at the main information desk.  You need to re-schedule your appointment should you arrive 10 or more minutes late.  We strive to give you quality time with our providers, and arriving late affects you and other patients whose appointments are after yours.  Also, if you no show three or more times for appointments you may be dismissed from the clinic at the providers discretion.     Again, thank you for choosing [facility].  Our hope is that these requests will decrease the amount of time that you wait before being seen by our physicians.       _____________________________________________________________  Should you have questions after your visit to [facility], please contact our office at [phone] between the hours of 8:00 a.m. and 4:30 p.m.  Voicemails left after 4:00 p.m. will not be returned until the following business day.  For prescription refill requests, have your pharmacy contact our office and allow 72 hours.    Due to Covid, you will need to wear a mask upon entering the hospital. If you do not have a mask, a mask will be given to you at the Main Entrance upon arrival. For doctor visits, patients may have 1 support person with them. For treatment visits, patients can not have anyone with them due to social distancing guidelines and our immunocompromised population.      

## 2019-06-01 NOTE — Assessment & Plan Note (Signed)
1.  Jordan [name] essential thrombocytosis: - Patient is high risk for thrombus given that she has had a previous stroke.  She is also over the age of 65.  She is not on anticoagulation due to her weakness in regards for fall risk.  However she is on aspirin 81 mg p.o. daily. -Patient is on Hydrea 500 mg twice daily. -She denies any leg ulcers or pruritis. -Labs on 05/13/2019 showed hemoglobin 13.4, hematocrit 41.8, platelets 392, WBC 6.9 -Counts have remained stable with the Hydrea.  She will continue taking Hydrea 500 mg twice daily. -Goal is platelet count of less than or equal to 450. - She will follow-up in 3 months with repeat labs.  2.  New cough with blood-tinged sputum: -Patient reports she smoked from the age of 55 till she was 46. -Patient had a CT of her chest on 01/18/2019 which showed no acute process or explanation for hemoptysis.  Trace bilateral pleural fluid.  Emphysema.  Probable subpleural lymph node along the right minor fissure.  CT follow-up in 1 year.

## 2019-06-01 NOTE — Progress Notes (Signed)
[place], [place] [id number]   CLINIC:  Medical Oncology/Hematology  PCP:  Terald Sleeper, PA-C [place] [id number] [phone]   REASON FOR VISIT: Follow-up for JAK2 positive essential thrombocytosis  CURRENT THERAPY: Hydrea twice daily   INTERVAL HISTORY:  Jordan [name] 79 y.o. female was called for a telephone visit today for essential thrombocytosis.  Patient reports she has been doing well since her last visit.  She denies any new complaints.  She is taking her medication as prescribed.  She has no side effects from the medication.  She denies any leg ulcers.  She denies any aquagenic pruritus. Denies any nausea, vomiting, or diarrhea. Denies any new pains. Had not noticed any recent bleeding such as epistaxis, hematuria or hematochezia. Denies recent chest pain on exertion, shortness of breath on minimal exertion, pre-syncopal episodes, or palpitations. Denies any numbness or tingling in hands or feet. Denies any recent fevers, infections, or recent hospitalizations. Patient reports appetite at 75% and energy level at 75%.  She is eating well maintain her weight this time.    REVIEW OF SYSTEMS:  Review of Systems  All other systems reviewed and are negative.    PAST MEDICAL/SURGICAL HISTORY:  Past Medical History:  Diagnosis Date  . Anxiety   . Arthritis   . Cancer [facility])    Skin cancer  . Cataracts, both eyes 05/2014  . COPD (chronic obstructive pulmonary disease) (HCC)    intermittent home O2 - rarely uses  . Depression   . Diabetes mellitus without complication ([place])   . GERD (gastroesophageal reflux disease)   . Hyperkalemia   . Hyperlipidemia   . Hypertension   . Seizures ([place])    From RH Negative blood when pregnant  . Stroke ([place])   . Thrombocytosis ([place])   . Thyroid disease    Past Surgical History:  Procedure Laterality Date  . ABDOMINAL HYSTERECTOMY    . ANKLE SURGERY Right   . BREAST SURGERY     Bx, none malignant  . COLONOSCOPY  2013   normal per patient  . COLONOSCOPY N/A 05/14/2016   Procedure: COLONOSCOPY;  Surgeon: Danie Binder, MD;  Location: [facility];  Service: Endoscopy;  Laterality: N/A;  8:30AM  . ESOPHAGOGASTRODUODENOSCOPY N/A 01/14/2016   Dr. Oneida Alar: normal esophagus, single hyperplastic gastric polyp, normal duodenum, negative H.pylori  . ROTATOR CUFF REPAIR Right   . Skin Cancers    . TONSILLECTOMY    . TONSILLECTOMY       SOCIAL HISTORY:  Social History   Socioeconomic History  . Marital status: Married    Spouse name: Carleene Overlie  . Number of children: 1  . Years of education: 40  . Highest education level: Associate degree: academic program  Occupational History  . Occupation: retired  Tobacco Use  . Smoking status: Former Smoker    Packs/day: 3.00    Years: 36.00    Pack years: 108.00    Types: Cigarettes    Quit date: 03/10/2000    Years since quitting: 19.2  . Smokeless tobacco: Never Used  Substance and Sexual Activity  . Alcohol use: No  . Drug use: No  . Sexual activity: Not Currently  Other Topics Concern  . Not on file  Social History Narrative  . Not on file   Social Determinants of Health   Financial Resource Strain: Low Risk   . Difficulty of Paying Living Expenses: Not hard at all  Food Insecurity: No Food Insecurity  . Worried About Charity fundraiser in the Last Year: Never true  . Ran Out of Food in the Last Year: Never true  Transportation Needs: No Transportation Needs  . Lack of Transportation (Medical): No  . Lack of Transportation (Non-Medical): No  Physical Activity: Inactive  . Days of Exercise per Week: 0 days  . Minutes of Exercise per Session: 0 min  Stress: No Stress Concern Present  . Feeling of Stress : Not at all  Social Connections: Somewhat Isolated  . Frequency of Communication with Friends and Family: More than three times a week  . Frequency of Social Gatherings with Friends and Family: More than  three times a week  . Attends Religious Services: Never  . Active Member of Clubs or Organizations: No  . Attends Archivist Meetings: Never  . Marital Status: Married  Human resources officer Violence: Not At Risk  . Fear of Current or Ex-Partner: No  . Emotionally Abused: No  . Physically Abused: No  . Sexually Abused: No    FAMILY HISTORY:  Family History  Problem Relation Age of Onset  . Alzheimer's disease Mother   . Arthritis Father   . Stroke Maternal Aunt     CURRENT MEDICATIONS:  Outpatient Encounter Medications as of 06/01/2019  Medication Sig  . aspirin EC 81 MG tablet Take 81 mg by mouth daily.  . Cyanocobalamin (VITAMIN B 12) 500 MCG TABS Take 2,500 mcg by mouth daily.  Marland Kitchen docusate sodium (COLACE) 100 MG capsule Take 1 capsule (100 mg total) by mouth 2 (two) times daily.  . ergocalciferol (VITAMIN D2) 1.25 MG (50000 UT) capsule Take 1 capsule (50,000 Units total) by mouth once a week.  . fexofenadine-pseudoephedrine (ALLEGRA-D 24) 180-240 MG 24 hr tablet Take 1 tablet by mouth as needed (allergies).   . gabapentin (NEURONTIN) 300 MG capsule TAKE 1 CAPSULE BY MOUTH EVERYDAY AT BEDTIME  . guaiFENesin (MUCINEX) 600 MG 12 hr tablet Take 1 tablet (600 mg total) by mouth 2 (two) times daily.  . hydroxyurea (HYDREA) 500 MG capsule TAKE 1 CAPSULE TWICE A DAY WITH FOOD TO MINIMIZE GI SIDE EFFECTS  . levothyroxine (SYNTHROID) 112 MCG tablet Take 1 tablet (112 mcg total) by mouth daily. (Needs to be seen before next refill)  . LORazepam (ATIVAN) 1 MG tablet Take 1 tablet (1 mg total) by mouth 2 (two) times daily as needed for anxiety. (Patient not taking: Reported on 01/21/2019)  . metFORMIN (GLUCOPHAGE) 500 MG tablet Take 1 tablet (500 mg total) by mouth 2 (two) times daily with a meal.  . metoprolol succinate (TOPROL-XL) 25 MG 24 hr tablet TAKE 1 TABLET ONCE DAILY  . Multiple Vitamins-Minerals (ICAPS AREDS 2 PO) Take 1 capsule by mouth 2 (two) times daily.  . OXYGEN Inhale 2  L into the lungs.  Marland Kitchen PARoxetine (PAXIL) 40 MG tablet Take 1 tablet (40 mg total) by mouth every morning.  . traZODone (DESYREL) 100 MG tablet TAKE 1/2 TO 1 TABLET AT BEDTIME (TAKES 1 TABLET)  . TRELEGY ELLIPTA 100-62.5-25 MCG/INH AEPB Inhale 1 puff into the lungs daily.   No facility-administered encounter medications on file as of 06/01/2019.    ALLERGIES:  Allergies  Allergen Reactions  . Bee Venom Anaphylaxis  . Codeine Itching  . Lipitor [Atorvastatin]     Extreme Dry Mouth, joint pain  . Penicillins Hives    Has patient had a PCN reaction causing immediate rash, facial/tongue/throat swelling, SOB or lightheadedness  with hypotension: No Has patient had a PCN reaction causing severe rash involving mucus membranes or skin necrosis: Yes Has patient had a PCN reaction that required hospitalization: No Has patient had a PCN reaction occurring within the last 10 years: No If all of the above answers are "NO", then may proceed with Cephalosporin use.   . Demeclocycline Rash  . Morphine And Related Rash  . Tetracyclines & Related Rash    Vital signs: -Deferred due to telephone visit  Physical Exam -Deferred due to telephone visit -Patient was alert and oriented over the phone and in no acute distress.   LABORATORY DATA:  I have reviewed the labs as listed.  CBC    Component Value Date/Time   WBC 6.9 05/13/2019 1313   RBC 3.46 (L) 05/13/2019 1313   HGB 13.4 05/13/2019 1313   HGB 14.8 11/22/2015 1123   HCT 41.8 05/13/2019 1313   HCT 46.7 (H) 11/22/2015 1123   PLT 392 05/13/2019 1313   PLT 598 (H) 11/22/2015 1123   MCV 120.8 (H) 05/13/2019 1313   MCV 73 (L) 11/22/2015 1123   MCH 38.7 (H) 05/13/2019 1313   MCHC 32.1 05/13/2019 1313   RDW 13.7 05/13/2019 1313   RDW 18.7 (H) 11/22/2015 1123   LYMPHSABS 0.8 05/13/2019 1313   LYMPHSABS 1.6 11/22/2015 1123   MONOABS 0.8 05/13/2019 1313   EOSABS 0.1 05/13/2019 1313   EOSABS 0.5 (H) 11/22/2015 1123   BASOSABS 0.1 05/13/2019  1313   BASOSABS 0.1 11/22/2015 1123   CMP Latest Ref Rng & Units 05/13/2019 01/18/2019 09/15/2018  Glucose 70 - 99 mg/dL 124(H) 108(H) 105(H)  BUN 8 - 23 mg/dL 13 16 17   Creatinine 0.44 - 1.00 mg/dL 1.04(H) 1.11(H) 0.73  Sodium 135 - 145 mmol/L 139 136 137  Potassium 3.5 - 5.1 mmol/L 4.1 4.6 4.7  Chloride 98 - 111 mmol/L 105 102 102  CO2 22 - 32 mmol/L 23 23 28   Calcium 8.9 - 10.3 mg/dL 9.1 8.7(L) 8.3(L)  Total Protein 6.5 - 8.1 g/dL 6.8 6.5 6.2(L)  Total Bilirubin 0.3 - 1.2 mg/dL 0.9 0.9 0.7  Alkaline Phos 38 - 126 U/L 70 65 60  AST 15 - 41 U/L 17 15 12(L)  ALT 0 - 44 U/L 10 9 10     All questions were answered to patient's stated satisfaction. Encouraged patient to call with any new concerns or questions before his next visit to the cancer center and we can certain see him sooner, if needed.     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  Jak 2 V6 70F essential thrombocytosis: - Patient is high risk for thrombus given that she has had a previous stroke.  She is also over the age of 57.  She is not on anticoagulation due to her weakness in regards for fall risk.  However she is on aspirin 81 mg p.o. daily. -Patient is on Hydrea 500 mg twice daily. -She denies any leg ulcers or pruritis. -Labs on 05/13/2019 showed hemoglobin 13.4, hematocrit 41.8, platelets 392, WBC 6.9 -Counts have remained stable with the Hydrea.  She will continue taking Hydrea 500 mg twice daily. -Goal is platelet count of less than or equal to 450. - She will follow-up in 3 months with repeat labs.  2.  New cough with blood-tinged sputum: -Patient reports she smoked from the age of 89 till she was 25. -Patient had a CT of her chest on 01/18/2019 which showed no acute process or explanation for  hemoptysis.  Trace bilateral pleural fluid.  Emphysema.  Probable subpleural lymph node along the right minor fissure.  CT follow-up in 1 year.       Orders placed this encounter:  Orders Placed This  Encounter  Procedures  . Lactate dehydrogenase  . CBC with Differential/Platelet  . Comprehensive metabolic panel  . Ferritin  . Iron and TIBC  . Vitamin B12  . VITAMIN D 25 Hydroxy (Vit-D Deficiency, Fractures)  . Folate    I provided 30 minutes of non face-to-face telephone visit time during this encounter, and > 50% was spent counseling as documented under my assessment & plan.  Francene Finders, FNP-C [place] [phone]

## 2019-06-06 ENCOUNTER — Telehealth: Payer: Self-pay | Admitting: *Deleted

## 2019-06-06 ENCOUNTER — Inpatient Hospital Stay (HOSPITAL_COMMUNITY): Payer: Medicare Other

## 2019-06-06 NOTE — Telephone Encounter (Signed)
Received phone call from patient's daughter [name]) stating that patient's appointment with Bartolo Darter, PA-C and would like to set up an appointment with Hendricks Limes, [place].  Appt made

## 2019-06-16 ENCOUNTER — Other Ambulatory Visit: Payer: Self-pay

## 2019-06-16 ENCOUNTER — Ambulatory Visit (HOSPITAL_COMMUNITY)
Admission: RE | Admit: 2019-06-16 | Discharge: 2019-06-16 | Disposition: A | Discharge status: Home / Self Care | Payer: Medicare Other | Source: Ambulatory Visit | Attending: Family Medicine | Admitting: Family Medicine

## 2019-06-16 ENCOUNTER — Telehealth: Payer: Self-pay | Admitting: Family Medicine

## 2019-06-16 ENCOUNTER — Ambulatory Visit (INDEPENDENT_AMBULATORY_CARE_PROVIDER_SITE_OTHER): Payer: Medicare Other | Admitting: Family Medicine

## 2019-06-16 VITALS — BP 101/67 | HR 101 | Temp 97.8°F

## 2019-06-16 DIAGNOSIS — R0902 Hypoxemia: Secondary | ICD-10-CM | POA: Insufficient documentation

## 2019-06-16 DIAGNOSIS — J449 Chronic obstructive pulmonary disease, unspecified: Secondary | ICD-10-CM | POA: Diagnosis not present

## 2019-06-16 DIAGNOSIS — Z66 Do not resuscitate: Secondary | ICD-10-CM

## 2019-06-16 DIAGNOSIS — R443 Hallucinations, unspecified: Secondary | ICD-10-CM

## 2019-06-16 DIAGNOSIS — R079 Chest pain, unspecified: Secondary | ICD-10-CM

## 2019-06-16 DIAGNOSIS — D471 Chronic myeloproliferative disease: Secondary | ICD-10-CM | POA: Diagnosis not present

## 2019-06-16 DIAGNOSIS — T85848A Pain due to other internal prosthetic devices, implants and grafts, initial encounter: Secondary | ICD-10-CM | POA: Diagnosis not present

## 2019-06-16 DIAGNOSIS — E039 Hypothyroidism, unspecified: Secondary | ICD-10-CM | POA: Diagnosis not present

## 2019-06-16 DIAGNOSIS — E119 Type 2 diabetes mellitus without complications: Secondary | ICD-10-CM

## 2019-06-16 LAB — BAYER DCA HB A1C WAIVED: HB A1C (BAYER DCA - WAIVED): 5.4 % (ref <7.0)

## 2019-06-16 MED ORDER — IOHEXOL 350 MG/ML SOLN
100.0000 mL | Freq: Once | INTRAVENOUS | Status: AC | PRN
  Administered 2019-06-16: 100 mL via INTRAVENOUS

## 2019-06-16 NOTE — Telephone Encounter (Signed)
Nurse , please call.

## 2019-06-16 NOTE — Progress Notes (Signed)
Assessment & Plan:  1. 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 Pain from breast implant, initial encounter - Patient is interested in a referral to a surgeon to discuss if this is a possibility to have the implants removed, but would like to wait until a later time to do so.  3. Chronic obstructive pulmonary disease, unspecified COPD type (HCC) - Increased oxygen from 2L to 3L via nasal cannula due to patient's oxygen saturation staying in the 80s. - Ambulatory referral to Hospice  4. Chest pain, unspecified type - D-dimer, quantitative (not at [facility]) - CT Angio Chest W/Cm &/Or Wo Cm; Future  5. Hallucination - Urinalysis, Complete - Urine dipstick shows positive for RBC's, positive for protein, positive for leukocytes and positive for ketones.  Micro exam: >30 WBC's per HPF, 3-10 RBC's per HPF and many bacteria.  6. Hypoxemia - CT Angio Chest W/Cm &/Or Wo Cm; Future  7. Diabetes mellitus without complication ([place]) - Bayer DCA Hb A1c Waived - CMP14+EGFR  8. Hypothyroidism, unspecified type - TSH  9. DNR (do not resuscitate) - DNR (Do Not Resuscitate)   Return as directed after labs and imaging.  Hendricks Limes, MSN, APRN, FNP-C [facility]  Subjective:    Patient ID: Jordan [name], female    DOB: [birth year], 79 y.o.   MRN: [id number]  Patient Care Team: Loman Brooklyn, FNP as PCP - General (Family Medicine) Danie Binder, MD as Consulting Physician (Gastroenterology)   Chief Complaint:  Chief Complaint  Patient presents with  . Establish Care  . Dysuria    Patient states since she was over walked x 3 weeks ago she has not been the same since.  . Fatigue  . Chest Pain  . Hallucinations    HPI: Milynn [name] is a 79 y.o. female presenting on 06/16/2019 for Establish Care, Dysuria (Patient states since she was over walked x 3 weeks ago she has  not been the same since.), Fatigue, Chest Pain, and Hallucinations  Patient is accompanied by her husband [name], who she is okay with being present.  Patient would like a referral to a new oncologist for her myeloproliferative disease.  She was previously seeing Dr. Walden Field who she was very happy with but has been switched to a nurse practitioner that she just does not feel a connection with.  Patient is also concerned about her breast implants.  They are hard and cause her a lot of pain.  She does not feel anyone will operate on her to remove them, but she has never had a consult to find out.  Patient has had dysuria, fatigue, chest pain, and hallucinations since she was "over walked" approximately 3 weeks ago.  The chest pain occurs intermittently.  She has not been seen by IR when it occurs.  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  Patient currently takes Metformin 500 mg twice daily.  She has not had an A1c completed since 2018.   Social history:  Relevant past medical, surgical, family and social history reviewed and updated as indicated. Interim medical history since our last visit reviewed.  Allergies and medications reviewed and updated.  DATA REVIEWED: CHART IN EPIC  ROS: Negative unless specifically indicated above in HPI.    Current Outpatient Medications:  .  aspirin EC 81 MG tablet, Take 81 mg by mouth daily., Disp: , Rfl:  .  Cyanocobalamin (VITAMIN B 12) 500 MCG TABS, Take 2,500 mcg by mouth daily., Disp: 30 tablet, Rfl: 4 .  docusate sodium (COLACE) 100 MG capsule, Take 1 capsule (100 mg total) by mouth 2 (two) times daily., Disp: 10 capsule, Rfl: 0 .  ergocalciferol (VITAMIN D2) 1.25 MG (50000 UT) capsule, Take 1 capsule (50,000 Units total) by mouth once a week., Disp: 16 capsule, Rfl: 4 .  fexofenadine-pseudoephedrine (ALLEGRA-D 24) 180-240 MG 24 hr tablet, Take 1 tablet by mouth as needed (allergies). , Disp: , Rfl:  .  gabapentin  (NEURONTIN) 300 MG capsule, TAKE 1 CAPSULE BY MOUTH EVERYDAY AT BEDTIME, Disp: 30 capsule, Rfl: 11 .  guaiFENesin (MUCINEX) 600 MG 12 hr tablet, Take 1 tablet (600 mg total) by mouth 2 (two) times daily., Disp: 60 tablet, Rfl: 11 .  hydroxyurea (HYDREA) 500 MG capsule, TAKE 1 CAPSULE TWICE A DAY WITH FOOD TO MINIMIZE GI SIDE EFFECTS, Disp: 60 capsule, Rfl: 5 .  levothyroxine (SYNTHROID) 112 MCG tablet, Take 1 tablet (112 mcg total) by mouth daily. (Needs to be seen before next refill), Disp: 30 tablet, Rfl: 0 .  Multiple Vitamins-Minerals (ICAPS AREDS 2 PO), Take 1 capsule by mouth 2 (two) times daily., Disp: , Rfl:  .  OXYGEN, Inhale 2 L into the lungs., Disp: , Rfl:  .  PARoxetine (PAXIL) 40 MG tablet, Take 1 tablet (40 mg total) by mouth every morning., Disp: 30 tablet, Rfl: 5 .  traZODone (DESYREL) 100 MG tablet, TAKE 1/2 TO 1 TABLET AT BEDTIME (TAKES 1 TABLET), Disp: 30 tablet, Rfl: 11 .  TRELEGY ELLIPTA 100-62.5-25 MCG/INH AEPB, Inhale 1 puff into the lungs daily., Disp: 60 each, Rfl: 11 .  ciprofloxacin (CIPRO) 500 MG tablet, Take 1 tablet (500 mg total) by mouth 2 (two) times daily for 10 days., Disp: 20 tablet, Rfl: 0 .  metFORMIN (GLUCOPHAGE) 500 MG tablet, Take 1 tablet (500 mg total) by mouth daily with breakfast., Disp: 90 tablet, Rfl: 0 .  metoprolol succinate (TOPROL-XL) 25 MG 24 hr tablet, Take 1 tablet (25 mg total) by mouth daily., Disp: 90 tablet, Rfl: 1 .  nystatin (MYCOSTATIN) 100000 UNIT/ML suspension, Take 5 mLs (500,000 Units total) by mouth 4 (four) times daily., Disp: 60 mL, Rfl: 0 .  predniSONE (STERAPRED UNI-PAK 21 TAB) 10 MG (21) TBPK tablet, As directed x 6 days, Disp: 21 tablet, Rfl: 0   Allergies  Allergen Reactions  . Bee Venom Anaphylaxis  . Codeine Itching  . Lipitor [Atorvastatin]     Extreme Dry Mouth, joint pain  . Penicillins Hives    Has patient had a PCN reaction causing immediate rash, facial/tongue/throat swelling, SOB or lightheadedness with  hypotension: No Has patient had a PCN reaction causing severe rash involving mucus membranes or skin necrosis: Yes Has patient had a PCN reaction that required hospitalization: No Has patient had a PCN reaction occurring within the last 10 years: No If all of the above answers are "NO", then may proceed with Cephalosporin use.   . Demeclocycline Rash  . Morphine And Related Rash  . Tetracyclines & Related Rash   Past Medical History:  Diagnosis Date  . Anxiety   . Arthritis   . Cancer [facility])    Skin cancer  . Cataracts, both eyes 05/2014  . COPD (chronic obstructive pulmonary disease) (HCC)    intermittent home O2 - rarely uses  . Depression   . Diabetes mellitus without complication ([place])   . GERD (gastroesophageal reflux disease)   .  Hyperkalemia   . Hyperlipidemia   . Hypertension   . Seizures ([place])    From RH Negative blood when pregnant  . Stroke ([place])   . Thrombocytosis ([place])   . Thyroid disease     Past Surgical History:  Procedure Laterality Date  . ABDOMINAL HYSTERECTOMY    . ANKLE SURGERY Right   . BREAST SURGERY     Bx, none malignant  . COLONOSCOPY  2013   normal per patient  . COLONOSCOPY N/A 05/14/2016   Procedure: COLONOSCOPY;  Surgeon: Danie Binder, MD;  Location: [facility];  Service: Endoscopy;  Laterality: N/A;  8:30AM  . ESOPHAGOGASTRODUODENOSCOPY N/A 01/14/2016   Dr. Oneida Alar: normal esophagus, single hyperplastic gastric polyp, normal duodenum, negative H.pylori  . ROTATOR CUFF REPAIR Right   . Skin Cancers    . TONSILLECTOMY    . TONSILLECTOMY      Social History   Socioeconomic History  . Marital status: Married    Spouse name: Carleene Overlie  . Number of children: 1  . Years of education: 69  . Highest education level: Associate degree: academic program  Occupational History  . Occupation: retired  Tobacco Use  . Smoking status: Former Smoker    Packs/day: 3.00    Years: 36.00    Pack years: 108.00    Types: Cigarettes    Quit date:  03/10/2000    Years since quitting: 19.3  . Smokeless tobacco: Never Used  Substance and Sexual Activity  . Alcohol use: No  . Drug use: No  . Sexual activity: Not Currently  Other Topics Concern  . Not on file  Social History Narrative  . Not on file   Social Determinants of Health   Financial Resource Strain: Low Risk   . Difficulty of Paying Living Expenses: Not hard at all  Food Insecurity: No Food Insecurity  . Worried About Charity fundraiser in the Last Year: Never true  . Ran Out of Food in the Last Year: Never true  Transportation Needs: No Transportation Needs  . Lack of Transportation (Medical): No  . Lack of Transportation (Non-Medical): No  Physical Activity: Inactive  . Days of Exercise per Week: 0 days  . Minutes of Exercise per Session: 0 min  Stress: No Stress Concern Present  . Feeling of Stress : Not at all  Social Connections: Somewhat Isolated  . Frequency of Communication with Friends and Family: More than three times a week  . Frequency of Social Gatherings with Friends and Family: More than three times a week  . Attends Religious Services: Never  . Active Member of Clubs or Organizations: No  . Attends Archivist Meetings: Never  . Marital Status: Married  Human resources officer Violence: Not At Risk  . Fear of Current or Ex-Partner: No  . Emotionally Abused: No  . Physically Abused: No  . Sexually Abused: No        Objective:    BP 101/67   Pulse (!) 101   Temp 97.8 F (36.6 C) (Temporal)   SpO2 (!) 86%   Wt Readings from Last 3 Encounters:  01/21/19 158 lb (71.7 kg)  12/24/18 158 lb (71.7 kg)  09/17/18 159 lb (72.1 kg)    Physical Exam Vitals reviewed.  Constitutional:      General: She is not in acute distress.    Appearance: Normal appearance. She is not ill-appearing, toxic-appearing or diaphoretic.  HENT:     Head: Normocephalic and atraumatic.  Eyes:  General: No scleral icterus.       Right eye: No discharge.         Left eye: No discharge.     Conjunctiva/sclera: Conjunctivae normal.  Cardiovascular:     Rate and Rhythm: Normal rate and regular rhythm.     Heart sounds: Normal heart sounds. No murmur. No friction rub. No gallop.   Pulmonary:     Effort: Pulmonary effort is normal. No respiratory distress.     Breath sounds: Normal breath sounds. No stridor. No wheezing, rhonchi or rales.  Musculoskeletal:        General: Normal range of motion.     Cervical back: Normal range of motion.  Skin:    General: Skin is warm and dry.     Capillary Refill: Capillary refill takes less than 2 seconds.  Neurological:     General: No focal deficit present.     Mental Status: She is alert and oriented to person, place, and time. Mental status is at baseline.     Gait: Gait abnormal (rides in wheelchair).  Psychiatric:        Mood and Affect: Mood normal.        Behavior: Behavior normal.        Thought Content: Thought content normal.        Judgment: Judgment normal.     Lab Results  Component Value Date   TSH 3.500 06/16/2019   Lab Results  Component Value Date   WBC 9.3 06/16/2019   HGB 14.0 06/16/2019   HCT 41.1 06/16/2019   MCV 114 (H) 06/16/2019   PLT 422 06/16/2019   Lab Results  Component Value Date   NA 140 06/16/2019   K 5.8 (H) 06/16/2019   CO2 22 06/16/2019   GLUCOSE 125 (H) 06/16/2019   BUN 16 06/16/2019   CREATININE 1.14 (H) 06/16/2019   BILITOT 0.5 06/16/2019   ALKPHOS 89 06/16/2019   AST 13 06/16/2019   ALT 7 06/16/2019   PROT 6.1 06/16/2019   ALBUMIN 4.0 06/16/2019   CALCIUM 9.6 06/16/2019   ANIONGAP 11 05/13/2019   Lab Results  Component Value Date   CHOL 243 (H) 04/17/2015   Lab Results  Component Value Date   HDL 39 (L) 04/17/2015   Lab Results  Component Value Date   LDLCALC 159 (H) 04/17/2015   Lab Results  Component Value Date   TRIG 225 (H) 04/17/2015   Lab Results  Component Value Date   CHOLHDL 6.2 (H) 04/17/2015   Lab Results  Component  Value Date   HGBA1C 5.4 06/16/2019

## 2019-06-16 NOTE — Telephone Encounter (Signed)
Returned a call from daughter about patient. Daughter is a Marine scientist and wants [place] to know some things she is concerned about with patient. Wanting [place] to know that she has had some episodes of chest pain with in the past few weeks.  States it has happened 2-3 times and they did not go to the ER.  Daughter feels like patient can be on pallative care- states that patient is end stage of COPD. If Karsten Fells feels that the patient would be a candidate she would like for her to talk to patient about it.  Needs urine checked today for a UTI. Daughter also states that patient has been having hallucinations - maybe due to UTI.   FYI for apt today 3pm

## 2019-06-17 ENCOUNTER — Telehealth: Payer: Self-pay | Admitting: Family Medicine

## 2019-06-17 LAB — CBC WITH DIFFERENTIAL/PLATELET
Basophils Absolute: 0.1 10*3/uL (ref 0.0–0.2)
Basos: 1 %
EOS (ABSOLUTE): 0.2 10*3/uL (ref 0.0–0.4)
Eos: 2 %
Hematocrit: 41.1 % (ref 34.0–46.6)
Hemoglobin: 14 g/dL (ref 11.1–15.9)
Immature Grans (Abs): 0 10*3/uL (ref 0.0–0.1)
Immature Granulocytes: 0 %
Lymphocytes Absolute: 1 10*3/uL (ref 0.7–3.1)
Lymphs: 11 %
MCH: 38.7 pg — ABNORMAL HIGH (ref 26.6–33.0)
MCHC: 34.1 g/dL (ref 31.5–35.7)
MCV: 114 fL — ABNORMAL HIGH (ref 79–97)
Monocytes Absolute: 1.1 10*3/uL — ABNORMAL HIGH (ref 0.1–0.9)
Monocytes: 12 %
Neutrophils Absolute: 6.9 10*3/uL (ref 1.4–7.0)
Neutrophils: 74 %
Platelets: 422 10*3/uL (ref 150–450)
RBC: 3.62 x10E6/uL — ABNORMAL LOW (ref 3.77–5.28)
RDW: 13.2 % (ref 11.7–15.4)
WBC: 9.3 10*3/uL (ref 3.4–10.8)

## 2019-06-17 LAB — CMP14+EGFR
ALT: 7 IU/L (ref 0–32)
AST: 13 IU/L (ref 0–40)
Albumin/Globulin Ratio: 1.9 (ref 1.2–2.2)
Albumin: 4 g/dL (ref 3.7–4.7)
Alkaline Phosphatase: 89 IU/L (ref 39–117)
BUN/Creatinine Ratio: 14 (ref 12–28)
BUN: 16 mg/dL (ref 8–27)
Bilirubin Total: 0.5 mg/dL (ref 0.0–1.2)
CO2: 22 mmol/L (ref 20–29)
Calcium: 9.6 mg/dL (ref 8.7–10.3)
Chloride: 102 mmol/L (ref 96–106)
Creatinine, Ser: 1.14 mg/dL — ABNORMAL HIGH (ref 0.57–1.00)
GFR calc Af Amer: 52 mL/min/{1.73_m2} — ABNORMAL LOW (ref >59)
GFR calc non Af Amer: 46 mL/min/{1.73_m2} — ABNORMAL LOW (ref >59)
Globulin, Total: 2.1 g/dL (ref 1.5–4.5)
Glucose: 125 mg/dL — ABNORMAL HIGH (ref 65–99)
Potassium: 5.8 mmol/L — ABNORMAL HIGH (ref 3.5–5.2)
Sodium: 140 mmol/L (ref 134–144)
Total Protein: 6.1 g/dL (ref 6.0–8.5)

## 2019-06-17 LAB — D-DIMER, QUANTITATIVE: D-DIMER: 0.59 mg/L FEU — ABNORMAL HIGH (ref 0.00–0.49)

## 2019-06-17 LAB — TSH: TSH: 3.5 u[IU]/mL (ref 0.450–4.500)

## 2019-06-17 NOTE — Telephone Encounter (Signed)
  Prescription Request  06/17/2019  What is the name of the medication or equipment? Pt had telephone visit with provider yesterday. Daughter called in today and wants "broad spectrum antibiotic" for uti. She also said she has thrush  Have you contacted your pharmacy to request a refill? (if applicable) no  Which pharmacy would you like this sent to? [facility]   Patient notified that their request is being sent to the clinical staff for review and that they should receive a response within 2 business days.

## 2019-06-20 ENCOUNTER — Other Ambulatory Visit: Payer: Medicare Other

## 2019-06-20 DIAGNOSIS — R443 Hallucinations, unspecified: Secondary | ICD-10-CM | POA: Diagnosis not present

## 2019-06-20 LAB — URINALYSIS, COMPLETE
Bilirubin, UA: NEGATIVE
Glucose, UA: NEGATIVE
Nitrite, UA: NEGATIVE
Specific Gravity, UA: >1.03 — ABNORMAL HIGH (ref 1.005–1.030)
Urobilinogen, Ur: 0.2 mg/dL (ref 0.2–1.0)
pH, UA: 5.5 (ref 5.0–7.5)

## 2019-06-20 LAB — MICROSCOPIC EXAMINATION
Epithelial Cells (non renal): >10 /hpf — AB (ref 0–10)
WBC, UA: >30 /hpf — AB (ref 0–5)

## 2019-06-21 ENCOUNTER — Telehealth: Payer: Self-pay | Admitting: Family Medicine

## 2019-06-21 ENCOUNTER — Other Ambulatory Visit: Payer: Self-pay | Admitting: Family Medicine

## 2019-06-21 DIAGNOSIS — E875 Hyperkalemia: Secondary | ICD-10-CM

## 2019-06-21 DIAGNOSIS — N1832 Chronic kidney disease, stage 3b: Secondary | ICD-10-CM

## 2019-06-21 DIAGNOSIS — N3001 Acute cystitis with hematuria: Secondary | ICD-10-CM

## 2019-06-21 DIAGNOSIS — B370 Candidal stomatitis: Secondary | ICD-10-CM

## 2019-06-21 MED ORDER — CIPROFLOXACIN HCL 500 MG PO TABS: 500.0000 mg | ORAL_TABLET | Freq: Two times a day (BID) | ORAL | 0 refills | Status: AC

## 2019-06-21 MED ORDER — METFORMIN HCL 500 MG PO TABS: 500.0000 mg | ORAL_TABLET | Freq: Every day | ORAL | 0 refills | Status: AC

## 2019-06-21 NOTE — Telephone Encounter (Signed)
Refer to lab results.  

## 2019-06-22 ENCOUNTER — Other Ambulatory Visit: Payer: Self-pay | Admitting: Family Medicine

## 2019-06-22 ENCOUNTER — Encounter: Payer: Self-pay | Admitting: Family Medicine

## 2019-06-22 DIAGNOSIS — J209 Acute bronchitis, unspecified: Secondary | ICD-10-CM

## 2019-06-22 MED ORDER — PREDNISONE 10 MG (21) PO TBPK: ORAL_TABLET | ORAL | 0 refills | Status: DC

## 2019-06-22 NOTE — Telephone Encounter (Signed)
Can she please give symptoms of what she believes is thrush?

## 2019-06-23 ENCOUNTER — Encounter: Payer: Self-pay | Admitting: Family Medicine

## 2019-06-23 ENCOUNTER — Encounter (HOSPITAL_COMMUNITY): Payer: Self-pay | Admitting: Nurse Practitioner

## 2019-06-23 MED ORDER — NYSTATIN 100000 UNIT/ML MT SUSP: 5.0000 mL | Freq: Four times a day (QID) | OROMUCOSAL | 0 refills | Status: AC

## 2019-06-23 NOTE — Telephone Encounter (Signed)
Daughter aware.  Verbalized understanding.

## 2019-06-23 NOTE — Telephone Encounter (Signed)
Nystatin sent

## 2019-06-23 NOTE — Telephone Encounter (Signed)
Per pt's daughter she has white spots on tongue and gums. Some places are thick.. The pt is unable to wear her dentures. It is painful to eat and drink. Hx of thrush and Nystatin has helped in the past.  Pt uses [facility].  Per daughter she has received a call from Hospice today.

## 2019-06-24 ENCOUNTER — Other Ambulatory Visit: Payer: Medicare Other

## 2019-06-24 ENCOUNTER — Other Ambulatory Visit: Payer: Self-pay | Admitting: *Deleted

## 2019-06-24 DIAGNOSIS — N3001 Acute cystitis with hematuria: Secondary | ICD-10-CM

## 2019-06-24 MED ORDER — METOPROLOL SUCCINATE ER 25 MG PO TB24: 25.0000 mg | ORAL_TABLET | Freq: Every day | ORAL | 1 refills | Status: AC

## 2019-06-25 ENCOUNTER — Encounter: Payer: Self-pay | Admitting: Family Medicine

## 2019-06-26 LAB — URINE CULTURE: Organism ID, Bacteria: NO GROWTH

## 2019-06-27 ENCOUNTER — Telehealth: Payer: Self-pay | Admitting: Family Medicine

## 2019-06-27 DIAGNOSIS — J45909 Unspecified asthma, uncomplicated: Secondary | ICD-10-CM | POA: Diagnosis not present

## 2019-06-27 DIAGNOSIS — K219 Gastro-esophageal reflux disease without esophagitis: Secondary | ICD-10-CM | POA: Diagnosis not present

## 2019-06-27 DIAGNOSIS — G4089 Other seizures: Secondary | ICD-10-CM | POA: Diagnosis not present

## 2019-06-27 DIAGNOSIS — E785 Hyperlipidemia, unspecified: Secondary | ICD-10-CM | POA: Diagnosis not present

## 2019-06-27 DIAGNOSIS — D471 Chronic myeloproliferative disease: Secondary | ICD-10-CM | POA: Diagnosis not present

## 2019-06-27 DIAGNOSIS — J449 Chronic obstructive pulmonary disease, unspecified: Secondary | ICD-10-CM | POA: Diagnosis not present

## 2019-06-27 DIAGNOSIS — E119 Type 2 diabetes mellitus without complications: Secondary | ICD-10-CM | POA: Diagnosis not present

## 2019-06-27 DIAGNOSIS — I10 Essential (primary) hypertension: Secondary | ICD-10-CM | POA: Diagnosis not present

## 2019-06-27 DIAGNOSIS — G9009 Other idiopathic peripheral autonomic neuropathy: Secondary | ICD-10-CM | POA: Diagnosis not present

## 2019-06-27 DIAGNOSIS — C4499 Other specified malignant neoplasm of skin, unspecified: Secondary | ICD-10-CM | POA: Diagnosis not present

## 2019-06-27 DIAGNOSIS — E039 Hypothyroidism, unspecified: Secondary | ICD-10-CM | POA: Diagnosis not present

## 2019-06-27 DIAGNOSIS — F419 Anxiety disorder, unspecified: Secondary | ICD-10-CM | POA: Diagnosis not present

## 2019-06-27 DIAGNOSIS — F330 Major depressive disorder, recurrent, mild: Secondary | ICD-10-CM | POA: Diagnosis not present

## 2019-06-27 NOTE — Telephone Encounter (Signed)
FYIOlin Hauser called from [facility] regarding patients referral, to let us know that patient is scheduled to see them on 06/30/19 @ 1:30 PM.

## 2019-06-29 DIAGNOSIS — E039 Hypothyroidism, unspecified: Secondary | ICD-10-CM | POA: Diagnosis not present

## 2019-06-29 DIAGNOSIS — E119 Type 2 diabetes mellitus without complications: Secondary | ICD-10-CM | POA: Diagnosis not present

## 2019-06-29 DIAGNOSIS — I10 Essential (primary) hypertension: Secondary | ICD-10-CM | POA: Diagnosis not present

## 2019-06-29 DIAGNOSIS — J449 Chronic obstructive pulmonary disease, unspecified: Secondary | ICD-10-CM | POA: Diagnosis not present

## 2019-06-29 DIAGNOSIS — K219 Gastro-esophageal reflux disease without esophagitis: Secondary | ICD-10-CM | POA: Diagnosis not present

## 2019-06-29 DIAGNOSIS — J45909 Unspecified asthma, uncomplicated: Secondary | ICD-10-CM | POA: Diagnosis not present

## 2019-06-30 DIAGNOSIS — Z7189 Other specified counseling: Secondary | ICD-10-CM | POA: Diagnosis not present

## 2019-06-30 DIAGNOSIS — D471 Chronic myeloproliferative disease: Secondary | ICD-10-CM | POA: Diagnosis not present

## 2019-06-30 DIAGNOSIS — J431 Panlobular emphysema: Secondary | ICD-10-CM | POA: Diagnosis not present

## 2019-07-05 DIAGNOSIS — J45909 Unspecified asthma, uncomplicated: Secondary | ICD-10-CM | POA: Diagnosis not present

## 2019-07-05 DIAGNOSIS — E119 Type 2 diabetes mellitus without complications: Secondary | ICD-10-CM | POA: Diagnosis not present

## 2019-07-05 DIAGNOSIS — J449 Chronic obstructive pulmonary disease, unspecified: Secondary | ICD-10-CM | POA: Diagnosis not present

## 2019-07-05 DIAGNOSIS — I10 Essential (primary) hypertension: Secondary | ICD-10-CM | POA: Diagnosis not present

## 2019-07-05 DIAGNOSIS — E039 Hypothyroidism, unspecified: Secondary | ICD-10-CM | POA: Diagnosis not present

## 2019-07-05 DIAGNOSIS — K219 Gastro-esophageal reflux disease without esophagitis: Secondary | ICD-10-CM | POA: Diagnosis not present

## 2019-07-07 DIAGNOSIS — I10 Essential (primary) hypertension: Secondary | ICD-10-CM | POA: Diagnosis not present

## 2019-07-07 DIAGNOSIS — E039 Hypothyroidism, unspecified: Secondary | ICD-10-CM | POA: Diagnosis not present

## 2019-07-07 DIAGNOSIS — J449 Chronic obstructive pulmonary disease, unspecified: Secondary | ICD-10-CM | POA: Diagnosis not present

## 2019-07-07 DIAGNOSIS — K219 Gastro-esophageal reflux disease without esophagitis: Secondary | ICD-10-CM | POA: Diagnosis not present

## 2019-07-07 DIAGNOSIS — J45909 Unspecified asthma, uncomplicated: Secondary | ICD-10-CM | POA: Diagnosis not present

## 2019-07-07 DIAGNOSIS — E119 Type 2 diabetes mellitus without complications: Secondary | ICD-10-CM | POA: Diagnosis not present

## 2019-07-09 DIAGNOSIS — E785 Hyperlipidemia, unspecified: Secondary | ICD-10-CM | POA: Diagnosis not present

## 2019-07-09 DIAGNOSIS — I10 Essential (primary) hypertension: Secondary | ICD-10-CM | POA: Diagnosis not present

## 2019-07-09 DIAGNOSIS — G9009 Other idiopathic peripheral autonomic neuropathy: Secondary | ICD-10-CM | POA: Diagnosis not present

## 2019-07-09 DIAGNOSIS — K219 Gastro-esophageal reflux disease without esophagitis: Secondary | ICD-10-CM | POA: Diagnosis not present

## 2019-07-09 DIAGNOSIS — E119 Type 2 diabetes mellitus without complications: Secondary | ICD-10-CM | POA: Diagnosis not present

## 2019-07-09 DIAGNOSIS — C4499 Other specified malignant neoplasm of skin, unspecified: Secondary | ICD-10-CM | POA: Diagnosis not present

## 2019-07-09 DIAGNOSIS — J45909 Unspecified asthma, uncomplicated: Secondary | ICD-10-CM | POA: Diagnosis not present

## 2019-07-09 DIAGNOSIS — J449 Chronic obstructive pulmonary disease, unspecified: Secondary | ICD-10-CM | POA: Diagnosis not present

## 2019-07-09 DIAGNOSIS — F330 Major depressive disorder, recurrent, mild: Secondary | ICD-10-CM | POA: Diagnosis not present

## 2019-07-09 DIAGNOSIS — G4089 Other seizures: Secondary | ICD-10-CM | POA: Diagnosis not present

## 2019-07-09 DIAGNOSIS — D471 Chronic myeloproliferative disease: Secondary | ICD-10-CM | POA: Diagnosis not present

## 2019-07-09 DIAGNOSIS — F419 Anxiety disorder, unspecified: Secondary | ICD-10-CM | POA: Diagnosis not present

## 2019-07-09 DIAGNOSIS — E039 Hypothyroidism, unspecified: Secondary | ICD-10-CM | POA: Diagnosis not present

## 2019-07-11 ENCOUNTER — Telehealth: Payer: Self-pay | Admitting: *Deleted

## 2019-07-11 DIAGNOSIS — J449 Chronic obstructive pulmonary disease, unspecified: Secondary | ICD-10-CM | POA: Diagnosis not present

## 2019-07-11 DIAGNOSIS — J45909 Unspecified asthma, uncomplicated: Secondary | ICD-10-CM | POA: Diagnosis not present

## 2019-07-11 DIAGNOSIS — I10 Essential (primary) hypertension: Secondary | ICD-10-CM | POA: Diagnosis not present

## 2019-07-11 DIAGNOSIS — K219 Gastro-esophageal reflux disease without esophagitis: Secondary | ICD-10-CM | POA: Diagnosis not present

## 2019-07-11 DIAGNOSIS — E039 Hypothyroidism, unspecified: Secondary | ICD-10-CM | POA: Diagnosis not present

## 2019-07-11 DIAGNOSIS — E119 Type 2 diabetes mellitus without complications: Secondary | ICD-10-CM | POA: Diagnosis not present

## 2019-07-11 NOTE — Telephone Encounter (Signed)
VM from [facility] w/ Hospice Pt has cough & congestion, ordering albuterol via neb per standing orders. Wanting to know about Rx for an antibiotic as well as for Allegra D She has lost her taste & smell, Hospice will get her tested on Wednesday, she is not sure about exposure she does have family that sees her without wearing masks

## 2019-07-12 ENCOUNTER — Encounter: Payer: Self-pay | Admitting: Family Medicine

## 2019-07-12 DIAGNOSIS — E119 Type 2 diabetes mellitus without complications: Secondary | ICD-10-CM | POA: Diagnosis not present

## 2019-07-12 DIAGNOSIS — J45909 Unspecified asthma, uncomplicated: Secondary | ICD-10-CM | POA: Diagnosis not present

## 2019-07-12 DIAGNOSIS — E039 Hypothyroidism, unspecified: Secondary | ICD-10-CM | POA: Diagnosis not present

## 2019-07-12 DIAGNOSIS — I10 Essential (primary) hypertension: Secondary | ICD-10-CM | POA: Diagnosis not present

## 2019-07-12 DIAGNOSIS — K219 Gastro-esophageal reflux disease without esophagitis: Secondary | ICD-10-CM | POA: Diagnosis not present

## 2019-07-12 DIAGNOSIS — J449 Chronic obstructive pulmonary disease, unspecified: Secondary | ICD-10-CM | POA: Diagnosis not present

## 2019-07-12 NOTE — Telephone Encounter (Signed)
Televisit set for 07/13/19 at [facility] with Hospice aware and left message on pt's vm

## 2019-07-12 NOTE — Telephone Encounter (Signed)
Needs televisit to assess for appropriateness of antibiotic.

## 2019-07-13 ENCOUNTER — Ambulatory Visit (INDEPENDENT_AMBULATORY_CARE_PROVIDER_SITE_OTHER): Admitting: Family Medicine

## 2019-07-13 VITALS — BP 104/76 | HR 83 | Temp 97.2°F | Resp 26

## 2019-07-13 DIAGNOSIS — I10 Essential (primary) hypertension: Secondary | ICD-10-CM | POA: Diagnosis not present

## 2019-07-13 DIAGNOSIS — J449 Chronic obstructive pulmonary disease, unspecified: Secondary | ICD-10-CM | POA: Diagnosis not present

## 2019-07-13 DIAGNOSIS — E119 Type 2 diabetes mellitus without complications: Secondary | ICD-10-CM | POA: Diagnosis not present

## 2019-07-13 DIAGNOSIS — K219 Gastro-esophageal reflux disease without esophagitis: Secondary | ICD-10-CM | POA: Diagnosis not present

## 2019-07-13 DIAGNOSIS — J441 Chronic obstructive pulmonary disease with (acute) exacerbation: Secondary | ICD-10-CM | POA: Diagnosis not present

## 2019-07-13 DIAGNOSIS — E039 Hypothyroidism, unspecified: Secondary | ICD-10-CM | POA: Diagnosis not present

## 2019-07-13 DIAGNOSIS — J45909 Unspecified asthma, uncomplicated: Secondary | ICD-10-CM | POA: Diagnosis not present

## 2019-07-13 MED ORDER — PREDNISONE 10 MG (21) PO TBPK: ORAL_TABLET | ORAL | 0 refills | Status: AC

## 2019-07-13 MED ORDER — LEVOFLOXACIN 750 MG PO TABS: 750.0000 mg | ORAL_TABLET | Freq: Every day | ORAL | 0 refills | Status: AC

## 2019-07-13 NOTE — Progress Notes (Signed)
Virtual Visit via Telephone Note  I connected with Jordan [name] on 07/13/19 at 2:56 PM by telephone and verified that I am speaking with the correct person using two identifiers. Jordan [name] is currently located at home and her husband and hospice nurse are currently with her during this visit. The provider, Loman Brooklyn, FNP is located in their office at time of visit.  I discussed the limitations, risks, security and privacy concerns of performing an evaluation and management service by telephone and the availability of in person appointments. I also discussed with the patient that there may be a patient responsible charge related to this service. The patient expressed understanding and agreed to proceed.  Subjective: PCP: Loman Brooklyn, FNP  Chief Complaint  Patient presents with  . Cough   Patient complains of cough, chest congestion, runny nose and shortness of breath. The cough is more frequent and thicker than usual. Onset of symptoms was 2 weeks ago, gradually worsening since that time. She is drinking plenty of fluids. Evaluation to date: COVID-19 testing completely today. Treatment to date: antihistamines, decongestants and Albuterol and Mucinex. She has a history of COPD. She does not smoke.    ROS: Per HPI  Current Outpatient Medications:  .  albuterol (PROVENTIL) (2.5 MG/3ML) 0.083% nebulizer solution, Inhale into the lungs., Disp: , Rfl:  .  fexofenadine (GOODSENSE ALLER-EASE) 180 MG tablet, Take by mouth., Disp: , Rfl:  .  LORazepam (ATIVAN) 1 MG tablet, Take by mouth., Disp: , Rfl:  .  mirtazapine (REMERON) 15 MG tablet, Take by mouth., Disp: , Rfl:  .  polyethylene glycol powder (GOODSENSE CLEARLAX) 17 GM/SCOOP powder, Take by mouth., Disp: , Rfl:  .  Sennosides-Docusate Sodium (SENNA S PO), Take by mouth., Disp: , Rfl:  .  aspirin EC 81 MG tablet, Take 81 mg by mouth daily., Disp: , Rfl:  .  Cyanocobalamin (VITAMIN B 12) 500 MCG TABS, Take 2,500 mcg by mouth  daily., Disp: 30 tablet, Rfl: 4 .  docusate sodium (COLACE) 100 MG capsule, Take 1 capsule (100 mg total) by mouth 2 (two) times daily., Disp: 10 capsule, Rfl: 0 .  ergocalciferol (VITAMIN D2) 1.25 MG (50000 UT) capsule, Take 1 capsule (50,000 Units total) by mouth once a week., Disp: 16 capsule, Rfl: 4 .  fexofenadine-pseudoephedrine (ALLEGRA-D 24) 180-240 MG 24 hr tablet, Take 1 tablet by mouth as needed (allergies). , Disp: , Rfl:  .  gabapentin (NEURONTIN) 300 MG capsule, TAKE 1 CAPSULE BY MOUTH EVERYDAY AT BEDTIME, Disp: 30 capsule, Rfl: 11 .  guaiFENesin (MUCINEX) 600 MG 12 hr tablet, Take 1 tablet (600 mg total) by mouth 2 (two) times daily., Disp: 60 tablet, Rfl: 11 .  hydroxyurea (HYDREA) 500 MG capsule, TAKE 1 CAPSULE TWICE A DAY WITH FOOD TO MINIMIZE GI SIDE EFFECTS, Disp: 60 capsule, Rfl: 5 .  levothyroxine (SYNTHROID) 112 MCG tablet, Take 1 tablet (112 mcg total) by mouth daily. (Needs to be seen before next refill), Disp: 30 tablet, Rfl: 0 .  metFORMIN (GLUCOPHAGE) 500 MG tablet, Take 1 tablet (500 mg total) by mouth daily with breakfast., Disp: 90 tablet, Rfl: 0 .  metoprolol succinate (TOPROL-XL) 25 MG 24 hr tablet, Take 1 tablet (25 mg total) by mouth daily., Disp: 90 tablet, Rfl: 1 .  Multiple Vitamins-Minerals (ICAPS AREDS 2 PO), Take 1 capsule by mouth 2 (two) times daily., Disp: , Rfl:  .  nystatin (MYCOSTATIN) 100000 UNIT/ML suspension, Take 5 mLs (500,000 Units total) by mouth 4 (  four) times daily., Disp: 60 mL, Rfl: 0 .  OXYGEN, Inhale 3 L into the lungs continuous., Disp: , Rfl:  .  PARoxetine (PAXIL) 40 MG tablet, Take 1 tablet (40 mg total) by mouth every morning., Disp: 30 tablet, Rfl: 5 .  predniSONE (STERAPRED UNI-PAK 21 TAB) 10 MG (21) TBPK tablet, As directed x 6 days, Disp: 21 tablet, Rfl: 0 .  traZODone (DESYREL) 100 MG tablet, TAKE 1/2 TO 1 TABLET AT BEDTIME (TAKES 1 TABLET), Disp: 30 tablet, Rfl: 11 .  TRELEGY ELLIPTA 100-62.5-25 MCG/INH AEPB, Inhale 1 puff into  the lungs daily., Disp: 60 each, Rfl: 11  Allergies  Allergen Reactions  . Bee Venom Anaphylaxis  . Codeine Itching  . Lipitor [Atorvastatin]     Extreme Dry Mouth, joint pain  . Penicillins Hives    Has patient had a PCN reaction causing immediate rash, facial/tongue/throat swelling, SOB or lightheadedness with hypotension: No Has patient had a PCN reaction causing severe rash involving mucus membranes or skin necrosis: Yes Has patient had a PCN reaction that required hospitalization: No Has patient had a PCN reaction occurring within the last 10 years: No If all of the above answers are "NO", then may proceed with Cephalosporin use.   . Demeclocycline Rash  . Morphine And Related Rash  . Tetracyclines & Related Rash   Past Medical History:  Diagnosis Date  . Anxiety   . Arthritis   . Cancer [facility])    Skin cancer  . Cataracts, both eyes 05/2014  . COPD (chronic obstructive pulmonary disease) (HCC)    intermittent home O2 - rarely uses  . Depression   . Diabetes mellitus without complication ([place])   . GERD (gastroesophageal reflux disease)   . Hyperkalemia   . Hyperlipidemia   . Hypertension   . Seizures ([place])    From RH Negative blood when pregnant  . Stroke ([place])   . Thrombocytosis ([place])   . Thyroid disease     Observations/Objective: A&O  No wheezing audible over the phone. She does get short of breath when talking. Mood, judgement, and thought processes all WNL   Assessment and Plan: 1. COPD with acute exacerbation (HCC) - levofloxacin (LEVAQUIN) 750 MG tablet; Take 1 tablet (750 mg total) by mouth daily for 5 days.  Dispense: 5 tablet; Refill: 0 - predniSONE (STERAPRED UNI-PAK 21 TAB) 10 MG (21) TBPK tablet; As directed x 6 days  Dispense: 21 tablet; Refill: 0   Follow Up Instructions:  I discussed the assessment and treatment plan with the patient. The patient was provided an opportunity to ask questions and all were answered. The patient agreed with the  plan and demonstrated an understanding of the instructions.   The patient was advised to call back or seek an in-person evaluation if the symptoms worsen or if the condition fails to improve as anticipated.  The above assessment and management plan was discussed with the patient. The patient verbalized understanding of and has agreed to the management plan. Patient is aware to call the clinic if symptoms persist or worsen. Patient is aware when to return to the clinic for a follow-up visit. Patient educated on when it is appropriate to go to the emergency department.   Time call ended: 3:13 PM  I provided 19 minutes of non-face-to-face time during this encounter.  Hendricks Limes, MSN, APRN, FNP-C [facility] 07/13/19

## 2019-07-14 DIAGNOSIS — E039 Hypothyroidism, unspecified: Secondary | ICD-10-CM | POA: Diagnosis not present

## 2019-07-14 DIAGNOSIS — K219 Gastro-esophageal reflux disease without esophagitis: Secondary | ICD-10-CM | POA: Diagnosis not present

## 2019-07-14 DIAGNOSIS — E119 Type 2 diabetes mellitus without complications: Secondary | ICD-10-CM | POA: Diagnosis not present

## 2019-07-14 DIAGNOSIS — J449 Chronic obstructive pulmonary disease, unspecified: Secondary | ICD-10-CM | POA: Diagnosis not present

## 2019-07-14 DIAGNOSIS — I10 Essential (primary) hypertension: Secondary | ICD-10-CM | POA: Diagnosis not present

## 2019-07-14 DIAGNOSIS — J45909 Unspecified asthma, uncomplicated: Secondary | ICD-10-CM | POA: Diagnosis not present

## 2019-07-18 DIAGNOSIS — J449 Chronic obstructive pulmonary disease, unspecified: Secondary | ICD-10-CM | POA: Diagnosis not present

## 2019-07-18 DIAGNOSIS — E119 Type 2 diabetes mellitus without complications: Secondary | ICD-10-CM | POA: Diagnosis not present

## 2019-07-18 DIAGNOSIS — I10 Essential (primary) hypertension: Secondary | ICD-10-CM | POA: Diagnosis not present

## 2019-07-18 DIAGNOSIS — E039 Hypothyroidism, unspecified: Secondary | ICD-10-CM | POA: Diagnosis not present

## 2019-07-18 DIAGNOSIS — K219 Gastro-esophageal reflux disease without esophagitis: Secondary | ICD-10-CM | POA: Diagnosis not present

## 2019-07-18 DIAGNOSIS — J45909 Unspecified asthma, uncomplicated: Secondary | ICD-10-CM | POA: Diagnosis not present

## 2019-07-19 DIAGNOSIS — E039 Hypothyroidism, unspecified: Secondary | ICD-10-CM | POA: Diagnosis not present

## 2019-07-19 DIAGNOSIS — J45909 Unspecified asthma, uncomplicated: Secondary | ICD-10-CM | POA: Diagnosis not present

## 2019-07-19 DIAGNOSIS — E119 Type 2 diabetes mellitus without complications: Secondary | ICD-10-CM | POA: Diagnosis not present

## 2019-07-19 DIAGNOSIS — I10 Essential (primary) hypertension: Secondary | ICD-10-CM | POA: Diagnosis not present

## 2019-07-19 DIAGNOSIS — K219 Gastro-esophageal reflux disease without esophagitis: Secondary | ICD-10-CM | POA: Diagnosis not present

## 2019-07-19 DIAGNOSIS — J449 Chronic obstructive pulmonary disease, unspecified: Secondary | ICD-10-CM | POA: Diagnosis not present

## 2019-07-21 DIAGNOSIS — E039 Hypothyroidism, unspecified: Secondary | ICD-10-CM | POA: Diagnosis not present

## 2019-07-21 DIAGNOSIS — J449 Chronic obstructive pulmonary disease, unspecified: Secondary | ICD-10-CM | POA: Diagnosis not present

## 2019-07-21 DIAGNOSIS — E119 Type 2 diabetes mellitus without complications: Secondary | ICD-10-CM | POA: Diagnosis not present

## 2019-07-21 DIAGNOSIS — K219 Gastro-esophageal reflux disease without esophagitis: Secondary | ICD-10-CM | POA: Diagnosis not present

## 2019-07-21 DIAGNOSIS — I10 Essential (primary) hypertension: Secondary | ICD-10-CM | POA: Diagnosis not present

## 2019-07-21 DIAGNOSIS — J45909 Unspecified asthma, uncomplicated: Secondary | ICD-10-CM | POA: Diagnosis not present

## 2019-07-25 ENCOUNTER — Encounter: Payer: Self-pay | Admitting: Family Medicine

## 2019-08-09 DEATH — deceased

## 2019-09-16 ENCOUNTER — Encounter (HOSPITAL_COMMUNITY): Payer: Self-pay

## 2019-09-16 ENCOUNTER — Other Ambulatory Visit (HOSPITAL_COMMUNITY): Payer: Medicare Other

## 2019-09-23 ENCOUNTER — Ambulatory Visit (HOSPITAL_COMMUNITY): Payer: Medicare Other | Admitting: Nurse Practitioner

## 2020-12-19 IMAGING — CT CT ANGIO CHEST
2 of 6 series · 19 of 46 positions shown · IV contrast (Omnipaque or Isovue)
Comparison: 01/18/2019

CLINICAL DATA: Hypoxemia.  Chest pain.

EXAM:
CT ANGIOGRAPHY CHEST WITH CONTRAST
TECHNIQUE: Multidetector CT imaging of the chest was performed using the
standard protocol during bolus administration of intravenous
contrast. Multiplanar CT image reconstructions and MIPs were
obtained to evaluate the vascular anatomy.
CONTRAST:  100mL OMNIPAQUE IOHEXOL 350 MG/ML SOLN

[Series 5: pe axial thins · axial · 0.62mm/px · z∈[+1096,+1358]mm · 16 of 288 slices shown]
[im 13/288  lung]
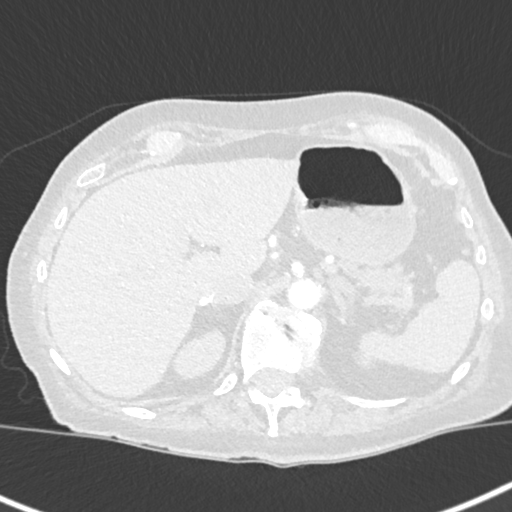
[im 38/288  soft-tissue]
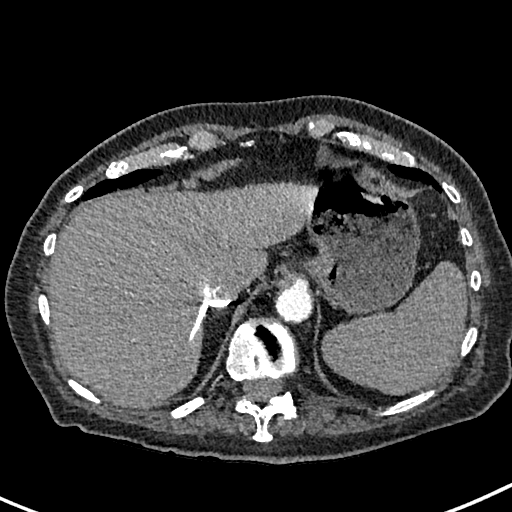
[im 50/288  lung]
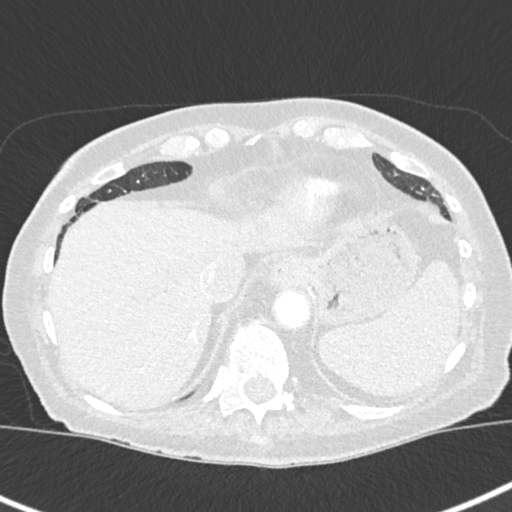
[im 63/288  soft-tissue]
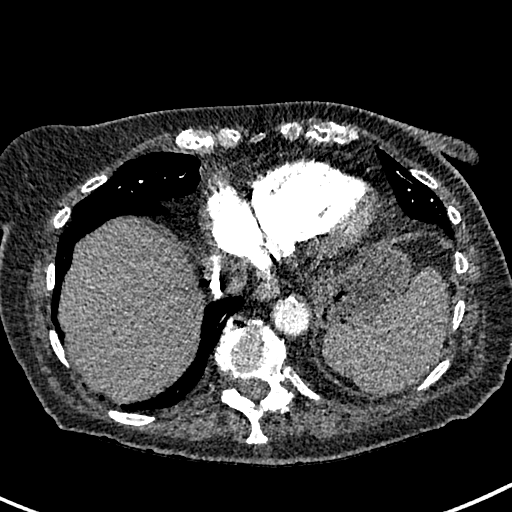
[im 88/288  lung]
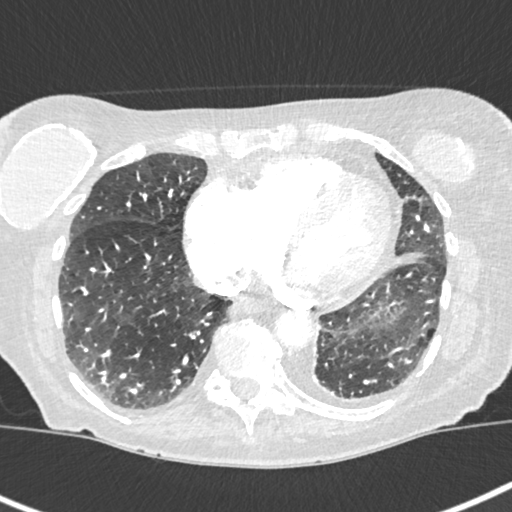
[im 100/288  soft-tissue]
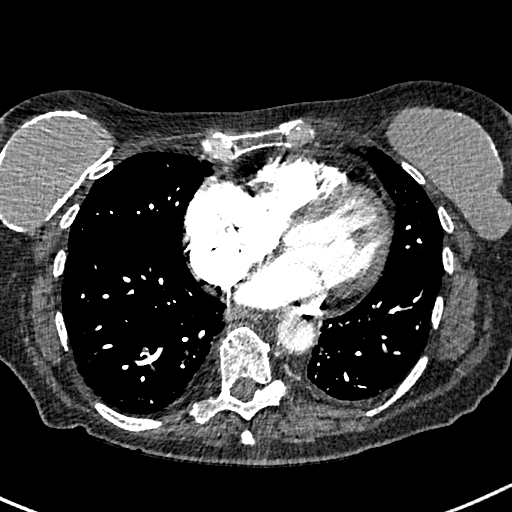
[im 113/288  lung]
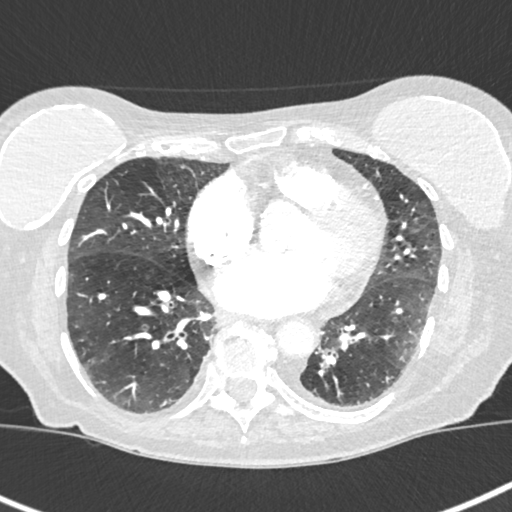
[im 138/288  soft-tissue]
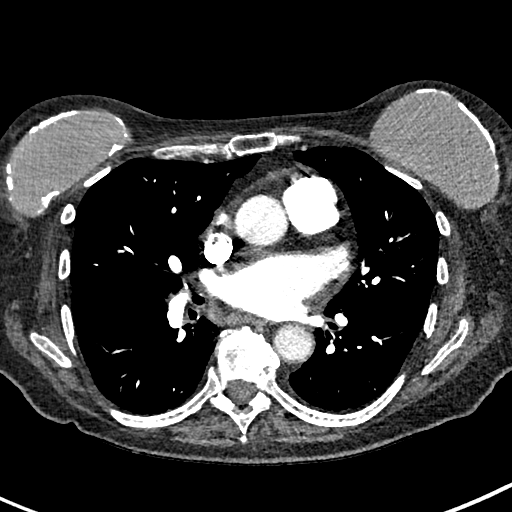
[im 150/288  lung]
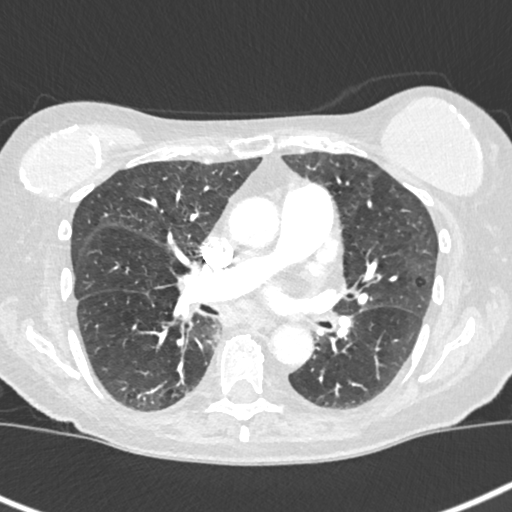
[im 175/288  soft-tissue]
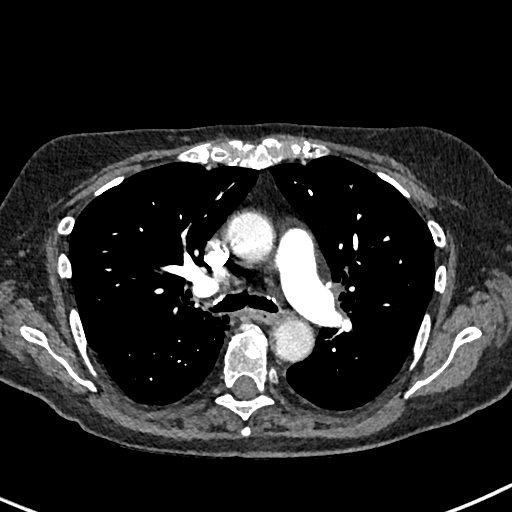
[im 188/288  lung]
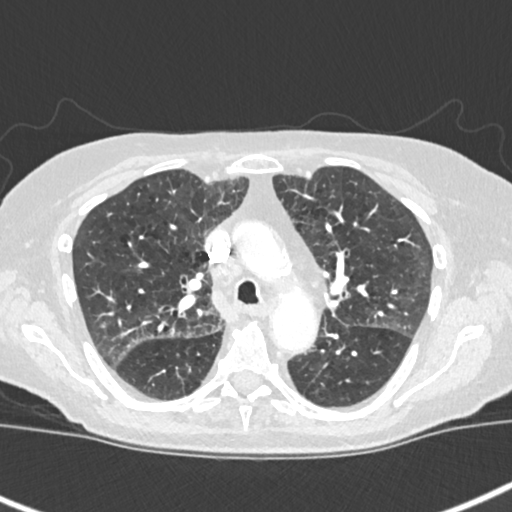
[im 200/288  soft-tissue]
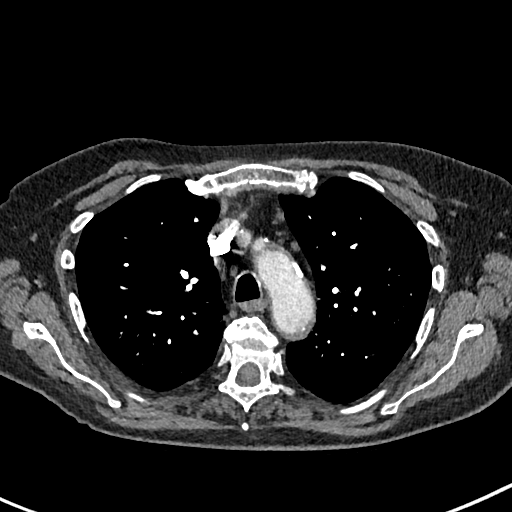
[im 225/288  lung]
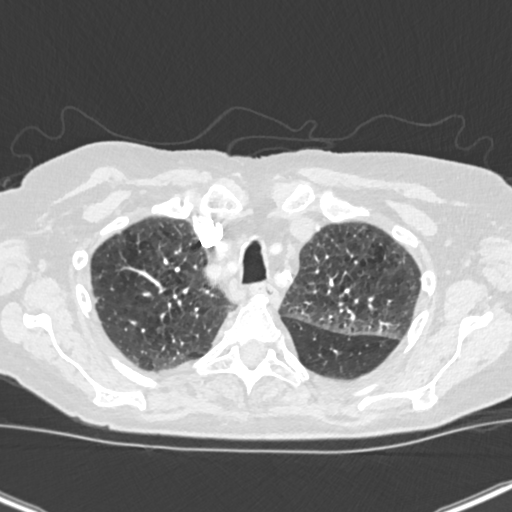
[im 238/288  soft-tissue]
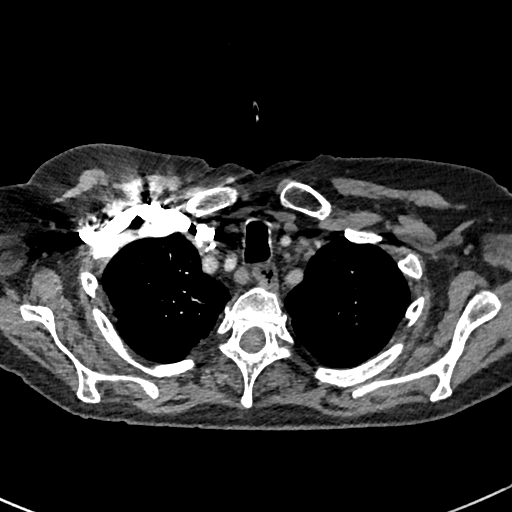
[im 250/288  lung]
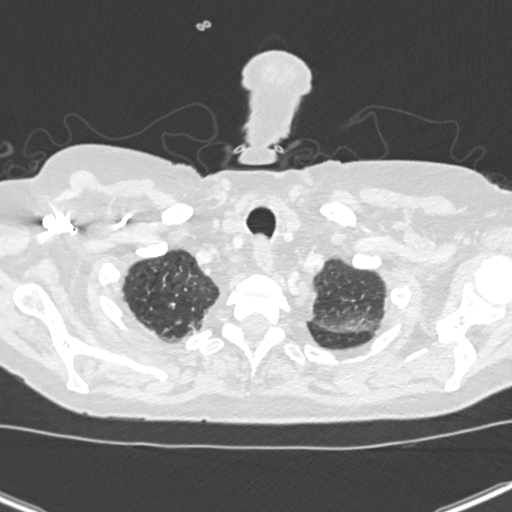
[im 275/288  soft-tissue]
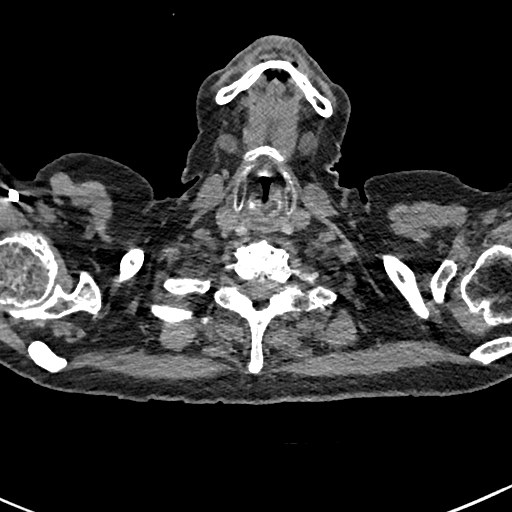

[Series 7: cor soft · coronal · 0.58mm/px · 3 of 112 slices shown]
[im 28/112  soft-tissue]
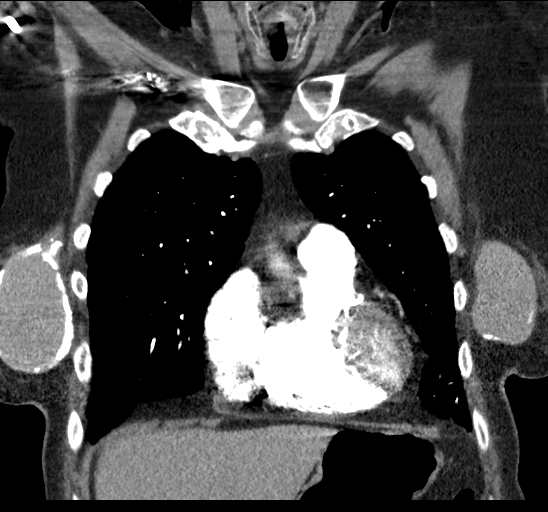
[im 56/112  soft-tissue]
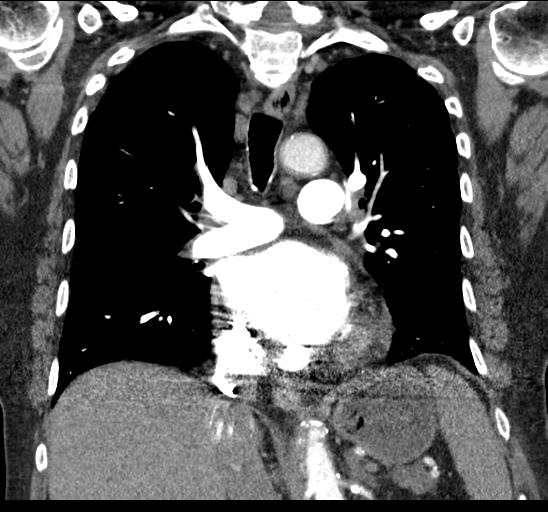
[im 84/112  soft-tissue]
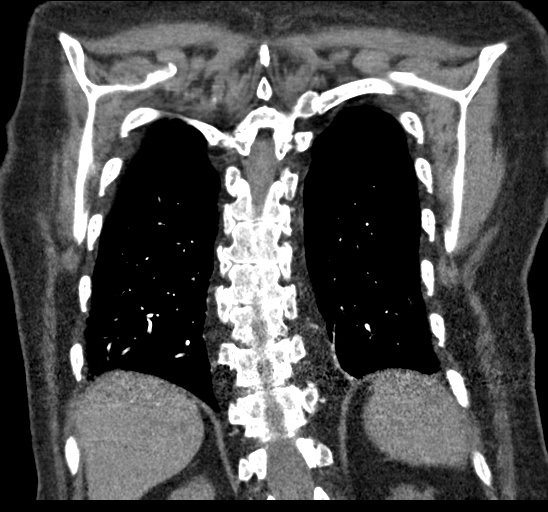

[19 of 46 positions shown; findings below may reference images not displayed]

FINDINGS: Cardiovascular: Pulmonary arterial opacification is excellent. There
are no pulmonary emboli. Heart size is upper limits of normal. No
visible coronary artery calcification. Aortic atherosclerosis
without aneurysm or dissection.

Mediastinum/Nodes: No mass or lymphadenopathy. Normal mediastinal
nodes unchanged.

Lungs/Pleura: Emphysematous changes in the upper lungs. Increased
interstitial markings, perhaps slightly more prominent than on the
previous study. Slightly more prominent bronchial thickening in a
widespread fashion when compared to the previous study. No pleural
fluid.

Upper Abdomen: Negative

Musculoskeletal: Ordinary degenerative changes affect the thoracic
spine.

Review of the MIP images confirms the above findings.
IMPRESSION: No pulmonary emboli.

Mild emphysema and interstitial lung markings. Mild widespread
bronchial thickening compared to the previous study, suggesting
acute bronchitis. Interstitial lung markings are also slightly more
prominent, possibly inflammatory.

Aortic atherosclerosis.
